# Patient Record
Sex: Female | Born: 1955 | Race: White | Hispanic: No | Marital: Married | Smoking: Former smoker
Health system: Southern US, Community
[De-identification: ages and names within clinical notes are randomized; demographics above are authoritative.]

## PROBLEM LIST (undated history)

## (undated) DIAGNOSIS — E039 Hypothyroidism, unspecified: Secondary | ICD-10-CM

## (undated) DIAGNOSIS — I219 Acute myocardial infarction, unspecified: Secondary | ICD-10-CM

## (undated) DIAGNOSIS — I639 Cerebral infarction, unspecified: Secondary | ICD-10-CM

## (undated) DIAGNOSIS — I2510 Atherosclerotic heart disease of native coronary artery without angina pectoris: Secondary | ICD-10-CM

## (undated) DIAGNOSIS — F419 Anxiety disorder, unspecified: Secondary | ICD-10-CM

## (undated) DIAGNOSIS — J40 Bronchitis, not specified as acute or chronic: Secondary | ICD-10-CM

## (undated) HISTORY — PX: TUBAL LIGATION: SHX77

## (undated) HISTORY — DX: Anxiety disorder, unspecified: F41.9

## (undated) HISTORY — DX: Cerebral infarction, unspecified: I63.9

## (undated) HISTORY — DX: Acute myocardial infarction, unspecified: I21.9

## (undated) HISTORY — DX: Bronchitis, not specified as acute or chronic: J40

---

## 1998-12-15 ENCOUNTER — Encounter: Admission: RE | Admit: 1998-12-15 | Discharge: 1998-12-15 | Payer: Self-pay | Admitting: Family Medicine

## 1998-12-15 ENCOUNTER — Encounter: Payer: Self-pay | Admitting: Family Medicine

## 2001-11-27 ENCOUNTER — Encounter: Payer: Self-pay | Admitting: Family Medicine

## 2001-11-27 ENCOUNTER — Ambulatory Visit (HOSPITAL_COMMUNITY): Admission: RE | Admit: 2001-11-27 | Discharge: 2001-11-27 | Payer: Self-pay | Admitting: Family Medicine

## 2003-03-21 ENCOUNTER — Ambulatory Visit (HOSPITAL_COMMUNITY): Admission: RE | Admit: 2003-03-21 | Discharge: 2003-03-21 | Payer: Self-pay | Admitting: Family Medicine

## 2007-06-15 ENCOUNTER — Ambulatory Visit (HOSPITAL_COMMUNITY): Admission: RE | Admit: 2007-06-15 | Discharge: 2007-06-15 | Payer: Self-pay | Admitting: Family Medicine

## 2007-11-04 DIAGNOSIS — D239 Other benign neoplasm of skin, unspecified: Secondary | ICD-10-CM

## 2007-11-04 HISTORY — DX: Other benign neoplasm of skin, unspecified: D23.9

## 2008-01-28 DIAGNOSIS — I219 Acute myocardial infarction, unspecified: Secondary | ICD-10-CM

## 2008-01-28 HISTORY — PX: ANGIOPLASTY: SHX39

## 2008-01-28 HISTORY — DX: Acute myocardial infarction, unspecified: I21.9

## 2008-04-27 ENCOUNTER — Emergency Department: Payer: Self-pay

## 2008-06-21 ENCOUNTER — Encounter: Payer: Self-pay | Admitting: Internal Medicine

## 2008-06-27 ENCOUNTER — Encounter: Payer: Self-pay | Admitting: Internal Medicine

## 2008-07-27 ENCOUNTER — Encounter: Payer: Self-pay | Admitting: Internal Medicine

## 2008-11-25 ENCOUNTER — Observation Stay: Payer: Self-pay | Admitting: Specialist

## 2008-11-28 ENCOUNTER — Encounter: Payer: Self-pay | Admitting: Unknown Physician Specialty

## 2008-12-14 ENCOUNTER — Ambulatory Visit: Payer: Self-pay | Admitting: Unknown Physician Specialty

## 2008-12-27 ENCOUNTER — Encounter: Payer: Self-pay | Admitting: Unknown Physician Specialty

## 2009-12-04 ENCOUNTER — Ambulatory Visit: Payer: Self-pay | Admitting: Surgery

## 2009-12-04 ENCOUNTER — Ambulatory Visit: Admission: RE | Admit: 2009-12-04 | Discharge: 2009-12-04 | Payer: Self-pay | Admitting: Family Medicine

## 2011-11-05 ENCOUNTER — Ambulatory Visit: Payer: Self-pay | Admitting: Family Medicine

## 2011-11-20 ENCOUNTER — Ambulatory Visit: Payer: Self-pay | Admitting: Emergency Medicine

## 2011-12-02 ENCOUNTER — Ambulatory Visit: Payer: Self-pay | Admitting: Emergency Medicine

## 2011-12-04 LAB — PATHOLOGY REPORT

## 2012-04-20 ENCOUNTER — Emergency Department: Payer: Self-pay | Admitting: Emergency Medicine

## 2012-06-12 ENCOUNTER — Observation Stay (HOSPITAL_COMMUNITY)
Admission: EM | Admit: 2012-06-12 | Discharge: 2012-06-13 | Disposition: A | Discharge status: Home / Self Care | Payer: Managed Care, Other (non HMO) | Attending: Cardiovascular Disease | Admitting: Cardiovascular Disease

## 2012-06-12 ENCOUNTER — Encounter (HOSPITAL_COMMUNITY): Payer: Self-pay | Admitting: Emergency Medicine

## 2012-06-12 ENCOUNTER — Emergency Department (HOSPITAL_COMMUNITY): Payer: Managed Care, Other (non HMO)

## 2012-06-12 DIAGNOSIS — R42 Dizziness and giddiness: Secondary | ICD-10-CM

## 2012-06-12 DIAGNOSIS — I959 Hypotension, unspecified: Principal | ICD-10-CM | POA: Insufficient documentation

## 2012-06-12 DIAGNOSIS — I2510 Atherosclerotic heart disease of native coronary artery without angina pectoris: Secondary | ICD-10-CM

## 2012-06-12 DIAGNOSIS — I252 Old myocardial infarction: Secondary | ICD-10-CM | POA: Insufficient documentation

## 2012-06-12 DIAGNOSIS — E039 Hypothyroidism, unspecified: Secondary | ICD-10-CM | POA: Insufficient documentation

## 2012-06-12 DIAGNOSIS — Z8674 Personal history of sudden cardiac arrest: Secondary | ICD-10-CM | POA: Insufficient documentation

## 2012-06-12 DIAGNOSIS — Z9861 Coronary angioplasty status: Secondary | ICD-10-CM | POA: Insufficient documentation

## 2012-06-12 DIAGNOSIS — R0602 Shortness of breath: Secondary | ICD-10-CM | POA: Insufficient documentation

## 2012-06-12 DIAGNOSIS — R079 Chest pain, unspecified: Secondary | ICD-10-CM | POA: Insufficient documentation

## 2012-06-12 HISTORY — DX: Atherosclerotic heart disease of native coronary artery without angina pectoris: I25.10

## 2012-06-12 HISTORY — DX: Hypothyroidism, unspecified: E03.9

## 2012-06-12 LAB — CBC WITH DIFFERENTIAL/PLATELET
Eosinophils Absolute: 0 10*3/uL (ref 0.0–0.7)
Eosinophils Relative: 1 % (ref 0–5)
HCT: 39.2 % (ref 36.0–46.0)
Lymphocytes Relative: 31 % (ref 12–46)
Lymphs Abs: 1.3 10*3/uL (ref 0.7–4.0)
MCH: 28.7 pg (ref 26.0–34.0)
MCV: 82 fL (ref 78.0–100.0)
Monocytes Absolute: 0.3 10*3/uL (ref 0.1–1.0)
Monocytes Relative: 6 % (ref 3–12)
Platelets: 220 10*3/uL (ref 150–400)
RBC: 4.78 MIL/uL (ref 3.87–5.11)
WBC: 4.3 10*3/uL (ref 4.0–10.5)

## 2012-06-12 LAB — COMPREHENSIVE METABOLIC PANEL
ALT: 19 U/L (ref 0–35)
BUN: 15 mg/dL (ref 6–23)
CO2: 21 mEq/L (ref 19–32)
Calcium: 9.3 mg/dL (ref 8.4–10.5)
Creatinine, Ser: 0.86 mg/dL (ref 0.50–1.10)
GFR calc Af Amer: 87 mL/min — ABNORMAL LOW (ref >90)
GFR calc non Af Amer: 75 mL/min — ABNORMAL LOW (ref >90)
Glucose, Bld: 98 mg/dL (ref 70–99)
Sodium: 138 mEq/L (ref 135–145)
Total Protein: 7.5 g/dL (ref 6.0–8.3)

## 2012-06-12 LAB — CBC
HCT: 38.5 % (ref 36.0–46.0)
MCV: 83.5 fL (ref 78.0–100.0)
RBC: 4.61 MIL/uL (ref 3.87–5.11)
RDW: 13.3 % (ref 11.5–15.5)
WBC: 5.1 10*3/uL (ref 4.0–10.5)

## 2012-06-12 LAB — PRO B NATRIURETIC PEPTIDE: Pro B Natriuretic peptide (BNP): 338.3 pg/mL — ABNORMAL HIGH (ref 0–125)

## 2012-06-12 LAB — CREATININE, SERUM
GFR calc Af Amer: >90 mL/min (ref >90)
GFR calc non Af Amer: 78 mL/min — ABNORMAL LOW (ref >90)

## 2012-06-12 LAB — TROPONIN I
Troponin I: <0.3 ng/mL (ref <0.30)
Troponin I: <0.3 ng/mL (ref <0.30)

## 2012-06-12 MED ORDER — ATORVASTATIN CALCIUM 80 MG PO TABS
80.0000 mg | ORAL_TABLET | Freq: Every day | ORAL | Status: DC
  Administered 2012-06-12: 80 mg via ORAL
  Filled 2012-06-12 (×2): qty 1

## 2012-06-12 MED ORDER — CITALOPRAM HYDROBROMIDE 10 MG PO TABS
10.0000 mg | ORAL_TABLET | Freq: Every day | ORAL | Status: DC
  Filled 2012-06-12 (×2): qty 1

## 2012-06-12 MED ORDER — ASPIRIN 300 MG RE SUPP
300.0000 mg | RECTAL | Status: DC
  Filled 2012-06-12: qty 1

## 2012-06-12 MED ORDER — FLUTICASONE PROPIONATE 50 MCG/ACT NA SUSP
2.0000 | Freq: Every day | NASAL | Status: DC
  Filled 2012-06-12: qty 16

## 2012-06-12 MED ORDER — NITROGLYCERIN 0.4 MG SL SUBL: 0.4000 mg | SUBLINGUAL_TABLET | SUBLINGUAL | Status: DC | PRN

## 2012-06-12 MED ORDER — ASPIRIN EC 81 MG PO TBEC
81.0000 mg | DELAYED_RELEASE_TABLET | Freq: Every day | ORAL | Status: DC
  Filled 2012-06-12: qty 1

## 2012-06-12 MED ORDER — ENOXAPARIN SODIUM 40 MG/0.4ML ~~LOC~~ SOLN
40.0000 mg | SUBCUTANEOUS | Status: DC
  Administered 2012-06-12: 40 mg via SUBCUTANEOUS
  Filled 2012-06-12 (×2): qty 0.4

## 2012-06-12 MED ORDER — METOPROLOL SUCCINATE ER 25 MG PO TB24
25.0000 mg | ORAL_TABLET | Freq: Every day | ORAL | Status: DC
  Administered 2012-06-12: 25 mg via ORAL
  Filled 2012-06-12 (×2): qty 1

## 2012-06-12 MED ORDER — ASPIRIN EC 81 MG PO TBEC
81.0000 mg | DELAYED_RELEASE_TABLET | Freq: Every day | ORAL | Status: DC
Start: 2012-06-13 — End: 2012-06-13
  Filled 2012-06-12: qty 1

## 2012-06-12 MED ORDER — ASPIRIN 81 MG PO CHEW: 324.0000 mg | CHEWABLE_TABLET | ORAL | Status: DC

## 2012-06-12 MED ORDER — SODIUM CHLORIDE 0.9 % IV SOLN
INTRAVENOUS | Status: AC
  Administered 2012-06-12: 20:00:00 via INTRAVENOUS

## 2012-06-12 MED ORDER — LEVOTHYROXINE SODIUM 25 MCG PO TABS
25.0000 ug | ORAL_TABLET | Freq: Every day | ORAL | Status: DC
  Administered 2012-06-13: 25 ug via ORAL
  Filled 2012-06-12 (×2): qty 1

## 2012-06-12 MED ORDER — RAMIPRIL 5 MG PO CAPS
5.0000 mg | ORAL_CAPSULE | Freq: Every day | ORAL | Status: DC
  Administered 2012-06-12: 5 mg via ORAL
  Filled 2012-06-12 (×2): qty 1

## 2012-06-12 MED ORDER — ACETAMINOPHEN 325 MG PO TABS: 650.0000 mg | ORAL_TABLET | ORAL | Status: DC | PRN

## 2012-06-12 MED ORDER — PANTOPRAZOLE SODIUM 40 MG PO TBEC
40.0000 mg | DELAYED_RELEASE_TABLET | Freq: Every day | ORAL | Status: DC
  Administered 2012-06-12: 40 mg via ORAL
  Filled 2012-06-12: qty 1

## 2012-06-12 MED ORDER — ONDANSETRON HCL 4 MG/2ML IJ SOLN: 4.0000 mg | Freq: Four times a day (QID) | INTRAMUSCULAR | Status: DC | PRN

## 2012-06-12 NOTE — ED Provider Notes (Signed)
History     CSN: [id number]  Arrival date & time 06/12/12  1046   First MD Initiated Contact with Patient 06/12/12 1049      No chief complaint on file.   (Consider location/radiation/quality/duration/timing/severity/associated sxs/prior treatment) HPI Comments: Patient brought to the ER by ambulance for chest pain. Patient reports that she started having some slight chest discomfort yesterday but it would come and go. This morning the pain has changed. Initially was under the left breast, now it is left upper chest. She had slight shortness of breath and nausea associated with the symptoms. She took aspirin and nitroglycerin. She's not sure if the nitroglycerin helped at all. Patient reports that she was feeling dizzy, like she was going to pass out.  Patient reports a previous "massive heart attack" 4 years ago. Patient reports that she had a left main occlusion and had a cardiac arrest with it. She reports that her only warning symptoms were dizziness and feeling like she was going to pass out, similar to today.  Patient was life flighted to [place]. She reports that she had a stent placed, but had occlusion of the stent several months later and required repeat stenting. Since then, however, she has been doing well. She reports that her stress test ovaries show false positives, she had a cardiac catheterization one year ago that showed everything was open.   No past medical history on file.  No past surgical history on file.  No family history on file.  History  Substance Use Topics  . Smoking status: Not on file  . Smokeless tobacco: Not on file  . Alcohol Use: Not on file    OB History   No data available      Review of Systems  Respiratory: Positive for shortness of breath.   Cardiovascular: Positive for chest pain.  Gastrointestinal: Positive for nausea.  Neurological: Positive for weakness.  All other systems reviewed and are negative.    Allergies  Review of  patient's allergies indicates not on file.  Home Medications  No current outpatient prescriptions on file.  There were no vitals taken for this visit.  Physical Exam  Constitutional: She is oriented to person, place, and time. She appears well-developed and well-nourished. No distress.  HENT:  Head: Normocephalic and atraumatic.  Right Ear: Hearing normal.  Left Ear: Hearing normal.  Nose: Nose normal.  Mouth/Throat: Oropharynx is clear and moist and mucous membranes are normal.  Eyes: Conjunctivae and EOM are normal. Pupils are equal, round, and reactive to light.  Neck: Normal range of motion. Neck supple.  Cardiovascular: Regular rhythm, S1 normal and S2 normal.  Exam reveals no gallop and no friction rub.   No murmur heard. Pulmonary/Chest: Effort normal and breath sounds normal. No respiratory distress. She exhibits no tenderness.  Abdominal: Soft. Normal appearance and bowel sounds are normal. There is no hepatosplenomegaly. There is no tenderness. There is no rebound, no guarding, no tenderness at McBurney's point and negative Murphy's sign. No hernia.  Musculoskeletal: Normal range of motion.  Neurological: She is alert and oriented to person, place, and time. She has normal strength. No cranial nerve deficit or sensory deficit. Coordination normal. GCS eye subscore is 4. GCS verbal subscore is 5. GCS motor subscore is 6.  Skin: Skin is warm, dry and intact. No rash noted. No cyanosis.  Psychiatric: She has a normal mood and affect. Her speech is normal and behavior is normal. Thought content normal.    ED Course  Procedures (  including critical care time)  EKG:  Date: 06/12/2012  Rate: 65  Rhythm: normal sinus rhythm  QRS Axis: normal  Intervals: normal  ST/T Wave abnormalities: nonspecific T wave changes  Conduction Disutrbances:none  Narrative Interpretation:   Old EKG Reviewed: none available    Labs Reviewed  COMPREHENSIVE METABOLIC PANEL - Abnormal; Notable  for the following:    GFR calc non Af Amer 75 (*)    GFR calc Af Amer 87 (*)    All other components within normal limits  PRO B NATRIURETIC PEPTIDE - Abnormal; Notable for the following:    Pro B Natriuretic peptide (BNP) 338.3 (*)    All other components within normal limits  CBC WITH DIFFERENTIAL  TROPONIN I   No results found.   Diagnosis: Chest pain    MDM  Patient comes to the ER for evaluation of chest pain. Patient has a significant cardiac history. Symptoms today are somewhat atypical, having intermittent pain that is not brought on by exertion and spontaneously resolves. She does, however, report that her symptoms were atypical prior to her MI as well. Her EKG did not show any acute changes and troponin was negative. Patient discussed with [facility] cardiology, will evaluate the patient for observation.        Gilda Crease, MD 06/12/12 [phone]

## 2012-06-12 NOTE — ED Notes (Signed)
Pt from home c/o chest pain with no radiation, Pt states pain yesterday, woke up a 4am w/reoccuring sharp stabbing pain in the chest. Pt denies pain at this time , no signs of distress

## 2012-06-12 NOTE — Consult Note (Signed)
ADMISSION HISTORY AND PHYSICAL   Date: 06/12/2012               Patient Name:  Tracey [name] MRN: [id number]  DOB: September 13, 1955 Age / Sex: 57 y.o., female        PCP: No primary provider on file. Primary Cardiologist: Irena Reichmann MD [facility]         History of Present Illness: Patient is a 57 y.o. female with a PMHx of CAD,  LAD occlusion associated with cardiac arrest, who was admitted to [facility] on 06/12/2012 for evaluation of chest pain.   [name] has been having some mild pain below her left breast for the past several days.  She had some lightheadedness and the pain moved up the the mid sternum.  She has not been driniking much.  She felt faint this am.  This feeling is very similar to her symptoms that she had with her prior MI and cardiac arrest.    Past cardiac hx :   April 2010 - she had a prox LAD stenosis, cardiac arrest with stenting.  Was taken  to [facility] emergent cath,  arctic sun protocol.  In November, she started havinng more dizziness and lightheadedness.  Cath revealed restenosis of her stent and she had another stent placed.    Jan. 2013 - abnormal stress test but subsequent cath looked OK.  She has had 2 stress tests which showed an abnormality and the subsequent cath did not reveal any significant abnormality.      Medications: Outpatient medications: No current facility-administered medications for this encounter.   Current Outpatient Prescriptions  Medication Sig Dispense Refill  . aspirin EC 81 MG tablet Take 81 mg by mouth daily.      . citalopram (CELEXA) 10 MG tablet Take 10 mg by mouth daily.      . fluticasone (FLONASE) 50 MCG/ACT nasal spray Place 2 sprays into the nose daily.      Marland Kitchen levothyroxine (SYNTHROID, LEVOTHROID) 25 MCG tablet Take 25 mcg by mouth daily before breakfast.      . metoprolol succinate (TOPROL-XL) 25 MG 24 hr tablet Take 25 mg by mouth daily.      . nitroGLYCERIN (NITROSTAT) 0.4 MG SL tablet Place 0.4 mg under the tongue every 5  (five) minutes as needed for chest pain.      . pantoprazole (PROTONIX) 40 MG tablet Take 40 mg by mouth daily.      . ramipril (ALTACE) 5 MG capsule Take 5 mg by mouth daily.      . rosuvastatin (CRESTOR) 40 MG tablet Take 40 mg by mouth daily.         Allergies  Allergen Reactions  . Sulfa Antibiotics Hives     Past Medical History  Diagnosis Date  . MI (myocardial infarction)   . Hypothyroid     Past Surgical History  Procedure Laterality Date  . Hernia repair      History reviewed. No pertinent family history.  Social History:  has no tobacco, alcohol, and drug history on file.   Review of Systems: Constitutional:  denies fever, chills, diaphoresis, appetite change and fatigue.  HEENT: denies photophobia, eye pain, redness, hearing loss, ear pain, congestion, sore throat, rhinorrhea, sneezing, neck pain, neck stiffness and tinnitus.  Respiratory: denies SOB, DOE, cough, chest tightness, and wheezing.  Cardiovascular: admits to chest pain, comes and goes  Gastrointestinal: admits to nausea this am,, no vomitting  Genitourinary: denies dysuria, urgency, frequency, hematuria, flank pain and  difficulty urinating.  Musculoskeletal: denies  myalgias, back pain, joint swelling, arthralgias and gait problem.   Skin: denies pallor, rash and wound.  Neurological: admits to dizziness, lightneadedmess  Hematological: denies adenopathy, easy bruising, personal or family bleeding history.  Psychiatric/ Behavioral: denies suicidal ideation, mood changes, confusion, nervousness, sleep disturbance and agitation.    Physical Exam: BP 112/62  Pulse 64  Temp(Src) 97.5 F (36.4 C) (Oral)  Resp 11  SpO2 98%  General: Vital signs reviewed and noted. Well-developed, well-nourished, in no acute distress; alert, appropriate and cooperative throughout examination.  Head: Normocephalic, atraumatic, sclera anicteric, mucus membranes are moist  Neck: Supple. Negative for carotid bruits.  JVD not elevated.  Lungs:  Clear bilaterally to auscultation without wheezes, rales, or rhonchi. Breathing is unlabored.  Heart: RRR with S1 S2. No murmurs, rubs, or gallops appreciated.  Abdomen:  Soft, non-tender, non-distended with normoactive bowel sounds. No hepatomegaly. No rebound/guarding. No obvious abdominal masses  MSK: Strength and the appear normal for age.  Extremities: No clubbing or cyanosis. No edema.  Distal pedal pulses are 2+, decreased skin turgur.Marland Kitchen  Neurologic: Alert and oriented X 3. Moves all extremities spontaneously  Psych:  Responds to questions appropriately with a normal affect.    Lab results: Basic Metabolic Panel:  Recent Labs Lab 06/12/12 1106  NA 138  K 4.2  CL 103  CO2 21  GLUCOSE 98  BUN 15  CREATININE 0.86  CALCIUM 9.3    Liver Function Tests:  Recent Labs Lab 06/12/12 1106  AST 24  ALT 19  ALKPHOS 69  BILITOT 0.3  PROT 7.5  ALBUMIN 3.7   No results found for this basename: LIPASE, AMYLASE,  in the last 168 hours  CBC:  Recent Labs Lab 06/12/12 1106  WBC 4.3  NEUTROABS 2.6  HGB 13.7  HCT 39.2  MCV 82.0  PLT 220    Cardiac Enzymes:  Recent Labs Lab 06/12/12 1107  TROPONINI <0.30    BNP: No components found with this basename: POCBNP,   CBG: No results found for this basename: GLUCAP,  in the last 168 hours  Coagulation Studies: No results found for this basename: LABPROT, INR,  in the last 72 hours   Other results:  EKG : poor quality tracing. NSR , no ST or T wave changes.     Assessment & Plan:  1. CAD :  Her presentation is very atypical but it is very similar to her previous episodes of angina equivalent. The original episode occurred in 2010 and was associated with a subsequent cardiac arrest when she had an occlusion of her LAD.  The next time she had episodes of lightheadedness she had repeat catheterization which revealed restenosis of the LAD stent requiring an additional stent  placement.  Today she presents with episodes of lightheadedness. She states it feels very similar to her previous episodes. The confounding factors that she's been on a diet and as a result has not been eating or  drinking as much as she typically does. Her blood pressure is  a little low. We discussed the fact that his lightheadedness may just be due to volume depletion.  We will keep overnight for observation.  Will hydrate her gently.    Check cardiac enzymes.    Tomorrow we will discuss whether to send her home or transfer to [facility].  She would like to continue her cardiology relationship with [facility].    All of her other medical problems are stable.    DVT PPX -  Vesta Mixer, Montez Hageman., MD, [facility] 06/12/2012, 2:39 PM

## 2012-06-13 ENCOUNTER — Encounter (HOSPITAL_COMMUNITY): Payer: Self-pay | Admitting: Physician Assistant

## 2012-06-13 DIAGNOSIS — I2510 Atherosclerotic heart disease of native coronary artery without angina pectoris: Secondary | ICD-10-CM

## 2012-06-13 DIAGNOSIS — R42 Dizziness and giddiness: Secondary | ICD-10-CM

## 2012-06-13 DIAGNOSIS — R079 Chest pain, unspecified: Secondary | ICD-10-CM

## 2012-06-13 LAB — CBC
Platelets: 208 10*3/uL (ref 150–400)
RBC: 4.52 MIL/uL (ref 3.87–5.11)
RDW: 13.4 % (ref 11.5–15.5)
WBC: 4.9 10*3/uL (ref 4.0–10.5)

## 2012-06-13 LAB — TROPONIN I
Troponin I: <0.3 ng/mL (ref <0.30)
Troponin I: <0.3 ng/mL (ref <0.30)

## 2012-06-13 LAB — BASIC METABOLIC PANEL
CO2: 26 mEq/L (ref 19–32)
Chloride: 108 mEq/L (ref 96–112)
Creatinine, Ser: 0.94 mg/dL (ref 0.50–1.10)
GFR calc Af Amer: 78 mL/min — ABNORMAL LOW (ref >90)
Potassium: 3.9 mEq/L (ref 3.5–5.1)
Sodium: 142 mEq/L (ref 135–145)

## 2012-06-13 NOTE — Discharge Summary (Signed)
Discharge Summary   Patient ID: Tracey [name] MRN: [id number], DOB/AGE: Aug 06, 1955 57 y.o. Admit date: 06/12/2012 D/C date:     06/13/2012  Primary Cardiologist: Dr. Theophilus Bones at [facility]  Primary Discharge Diagnoses:  1. Dizziness/hypotension, suspected related to decreased PO intake - resolved with IV fluids - ramipril discontinued 2. CAD without evidence for ACS this admission  - troponins neg x 4 - prior history: 04/2008: prox LAD occlusion a/w cardiac arrest treated with stenting & arctic sun, November 2010: cath showing restenosis s/p repeat stenting, Jan 2013: abnormal stress test but cath looked OK  Secondary Discharge Diagnoses:  1. Hypothyroidism  Hospital Course: [name] is a 57 y/o F with history of CAD, LAD occlusion associated with cardiac arrest, who was admitted to [facility] on 06/12/2012 for evaluation of chest pain and lightheadedness. She has been having some mild pain below her left breast for the past several days. She had some lightheadedness and the pain moved up the the mid sternum. She also reported feeling faint. She has not been drinking much. CBC, CMET were unremarkable. CXR was negative. EKG showed NSR , no ST or T wave changes. Her chest pain was felt very atypical - cardiac enzymes remained negative. With regard to her lightheadedness, she has been on a diet and not eating or drinking as much as she typically does. Dizziness was felt related to volume depletion and improved with IV fluids overnight. Today she feels much better and is eager to go home. We will discontinue ramipiril. Dr. Elease Hashimoto has seen and examined her today and feels she is stable for discharge. The patient will call Dr. Theophilus Bones to arrange followup at [facility] on Monday as she wants to continue to be followed at [facility].  Discharge Vitals: Blood pressure 95/63, pulse 57, temperature 98.3 F (36.8 C), temperature source Oral, resp. rate 16, height 5\' 5"  (1.651 m), weight 202 lb 11.2 oz (91.944 kg), SpO2  98.00%.  Labs: Lab Results  Component Value Date   WBC 4.9 06/13/2012   HGB 12.8 06/13/2012   HCT 37.5 06/13/2012   MCV 83.0 06/13/2012   PLT 208 06/13/2012    Recent Labs Lab 06/12/12 1106  06/13/12 0525  NA 138  --  142  K 4.2  --  3.9  CL 103  --  108  CO2 21  --  26  BUN 15  --  15  CREATININE 0.86  < > 0.94  CALCIUM 9.3  --  8.9  PROT 7.5  --   --   BILITOT 0.3  --   --   ALKPHOS 69  --   --   ALT 19  --   --   AST 24  --   --   GLUCOSE 98  --  90  < > = values in this interval not displayed.  Recent Labs  06/12/12 1107 06/12/12 1800 06/13/12 0004 06/13/12 0525  TROPONINI <0.30 <0.30 <0.30 <0.30    Diagnostic Studies/Procedures   Dg Chest Port 1 View 06/12/2012   *RADIOLOGY REPORT*  Clinical Data: Left-sided chest pain  PORTABLE CHEST - 1 VIEW  Comparison: Chest radiograph 03/20/1998  Findings: Cardiac leads project over the chest.  Normal heart, mediastinal, and hilar contours.  Coronary artery stent projects over the left heart.  Pulmonary vascularity is normal.  The lungs are clear.  Negative for pleural effusion.  No acute osseous abnormality identified.  IMPRESSION: Coronary artery stent noted.  No acute cardiopulmonary disease.   Original Report  Authenticated By: Britta Mccreedy, M.D.    Discharge Medications     Medication List    STOP taking these medications       ramipril 5 MG capsule  Commonly known as:  ALTACE      TAKE these medications       aspirin EC 81 MG tablet  Take 81 mg by mouth daily.     citalopram 10 MG tablet  Commonly known as:  CELEXA  Take 10 mg by mouth daily.     fluticasone 50 MCG/ACT nasal spray  Commonly known as:  FLONASE  Place 2 sprays into the nose daily.     levothyroxine 25 MCG tablet  Commonly known as:  SYNTHROID, LEVOTHROID  Take 25 mcg by mouth daily before breakfast.     metoprolol succinate 25 MG 24 hr tablet  Commonly known as:  TOPROL-XL  Take 25 mg by mouth daily.     nitroGLYCERIN 0.4 MG SL tablet   Commonly known as:  NITROSTAT  Place 0.4 mg under the tongue every 5 (five) minutes as needed for chest pain.     pantoprazole 40 MG tablet  Commonly known as:  PROTONIX  Take 40 mg by mouth daily.     rosuvastatin 40 MG tablet  Commonly known as:  CRESTOR  Take 40 mg by mouth daily.        Disposition   The patient will be discharged in stable condition to home. Discharge Orders   Future Orders Complete By Expires     Diet - low sodium heart healthy  As directed     Discharge instructions  As directed     Comments:      Please check your blood pressure at home. If it consistently below 95 on the top number or if you feel poorly again, please call your doctor.  Do not take nitroglycerin if your blood pressure is low as it may cause it to drop further.    Increase activity slowly  As directed       Follow-up Information   Follow up with Dr. Theophilus Bones. (Please call Dr. Wynelle Cleveland office tomorrow for a followup appointment)    Contact information:   [facility] [employer]        Duration of Discharge Encounter: Greater than 30 minutes including physician and PA time.  Signed, Ronie Spies PA-C 06/13/2012, 8:53 AM  Attending NOte:  Please see my note from day of discharge.   Vesta Mixer, Montez Hageman., MD, [facility] 06/20/2012, 9:44 PM Office - [phone] Pager [phone]

## 2012-06-13 NOTE — Progress Notes (Signed)
Utilization review completed.  

## 2012-06-13 NOTE — H&P (Signed)
ADMISSION HISTORY AND PHYSICAL   Date: 06/13/2012               Patient Name:  Tracey [name] MRN: [id number]  DOB: 03/22/1955 Age / Sex: 57 y.o., female        PCP: No primary provider on file. Primary Cardiologist: Irena Reichmann MD [facility]         History of Present Illness: Patient is a 57 y.o. female with a PMHx of CAD,  LAD occlusion associated with cardiac arrest, who was admitted to [facility] on 06/12/2012 for evaluation of chest pain.   Honour has been having some mild pain below her left breast for the past several days.  She had some lightheadedness and the pain moved up the the mid sternum.  She has not been driniking much.  She felt faint this am.  This feeling is very similar to her symptoms that she had with her prior MI and cardiac arrest.    Past cardiac hx :   April 2010 - she had a prox LAD stenosis, cardiac arrest with stenting.  Was taken  to [facility] emergent cath,  arctic sun protocol.  In November, she started havinng more dizziness and lightheadedness.  Cath revealed restenosis of her stent and she had another stent placed.    Jan. 2013 - abnormal stress test but subsequent cath looked OK.  She has had 2 stress tests which showed an abnormality and the subsequent cath did not reveal any significant abnormality.      Medications: Outpatient medications: Current Facility-Administered Medications  Medication Dose Route Frequency Provider Last Rate Last Dose  . acetaminophen (TYLENOL) tablet 650 mg  650 mg Oral Q4H PRN Vesta Mixer, MD      . aspirin chewable tablet 324 mg  324 mg Oral NOW Vesta Mixer, MD       Or  . aspirin suppository 300 mg  300 mg Rectal NOW Vesta Mixer, MD      . aspirin EC tablet 81 mg  81 mg Oral Daily Vesta Mixer, MD      . aspirin EC tablet 81 mg  81 mg Oral Daily Vesta Mixer, MD      . atorvastatin (LIPITOR) tablet 80 mg  80 mg Oral q1800 Vesta Mixer, MD   80 mg at 06/12/12 1931  . citalopram (CELEXA) tablet 10 mg   10 mg Oral Daily Vesta Mixer, MD      . enoxaparin (LOVENOX) injection 40 mg  40 mg Subcutaneous Q24H Vesta Mixer, MD   40 mg at 06/12/12 2104  . fluticasone (FLONASE) 50 MCG/ACT nasal spray 2 spray  2 spray Each Nare Daily Vesta Mixer, MD      . levothyroxine (SYNTHROID, LEVOTHROID) tablet 25 mcg  25 mcg Oral QAC breakfast Vesta Mixer, MD   25 mcg at 06/13/12 0805  . metoprolol succinate (TOPROL-XL) 24 hr tablet 25 mg  25 mg Oral QHS Vesta Mixer, MD   25 mg at 06/12/12 2105  . nitroGLYCERIN (NITROSTAT) SL tablet 0.4 mg  0.4 mg Sublingual Q5 Min x 3 PRN Vesta Mixer, MD      . ondansetron [facility]) injection 4 mg  4 mg Intravenous Q6H PRN Vesta Mixer, MD      . pantoprazole (PROTONIX) EC tablet 40 mg  40 mg Oral Daily Vesta Mixer, MD   40 mg at 06/12/12 1930     Allergies  Allergen  Reactions  . Sulfa Antibiotics Hives     Past Medical History  Diagnosis Date  . CAD (coronary artery disease)     a. 04/2008: cariac arrest with prox LAD occ s/p stenting, arctic sun. b. 11/2008: restenosis s/p repeat stenting. c. 01/2011: abnormal stress test but cath reportedly OK.  Marland Kitchen Hypothyroid     History reviewed. No pertinent past surgical history.  History reviewed. No pertinent family history.  Social History:  reports that she has never smoked. She has never used smokeless tobacco. She reports that she does not drink alcohol. Her drug history is not on file.   Review of Systems: Constitutional:  denies fever, chills, diaphoresis, appetite change and fatigue.  HEENT: denies photophobia, eye pain, redness, hearing loss, ear pain, congestion, sore throat, rhinorrhea, sneezing, neck pain, neck stiffness and tinnitus.  Respiratory: denies SOB, DOE, cough, chest tightness, and wheezing.  Cardiovascular: admits to chest pain, comes and goes  Gastrointestinal: admits to nausea this am,, no vomitting  Genitourinary: denies dysuria, urgency, frequency, hematuria, flank pain  and difficulty urinating.  Musculoskeletal: denies  myalgias, back pain, joint swelling, arthralgias and gait problem.   Skin: denies pallor, rash and wound.  Neurological: admits to dizziness, lightneadedmess  Hematological: denies adenopathy, easy bruising, personal or family bleeding history.  Psychiatric/ Behavioral: denies suicidal ideation, mood changes, confusion, nervousness, sleep disturbance and agitation.    Physical Exam: BP 95/63  Pulse 57  Temp(Src) 98.3 F (36.8 C) (Oral)  Resp 16  Ht 5\' 5"  (1.651 m)  Wt 202 lb 11.2 oz (91.944 kg)  BMI 33.73 kg/m2  SpO2 98%  General: Vital signs reviewed and noted. Well-developed, well-nourished, in no acute distress; alert, appropriate and cooperative throughout examination.  Head: Normocephalic, atraumatic, sclera anicteric, mucus membranes are moist  Neck: Supple. Negative for carotid bruits. JVD not elevated.  Lungs:  Clear bilaterally to auscultation without wheezes, rales, or rhonchi. Breathing is unlabored.  Heart: RRR with S1 S2. No murmurs, rubs, or gallops appreciated.  Abdomen:  Soft, non-tender, non-distended with normoactive bowel sounds. No hepatomegaly. No rebound/guarding. No obvious abdominal masses  MSK: Strength and the appear normal for age.  Extremities: No clubbing or cyanosis. No edema.  Distal pedal pulses are 2+, decreased skin turgur.Marland Kitchen  Neurologic: Alert and oriented X 3. Moves all extremities spontaneously  Psych:  Responds to questions appropriately with a normal affect.    Lab results: Basic Metabolic Panel:  Recent Labs Lab 06/12/12 1106 06/12/12 1800 06/13/12 0525  NA 138  --  142  K 4.2  --  3.9  CL 103  --  108  CO2 21  --  26  GLUCOSE 98  --  90  BUN 15  --  15  CREATININE 0.86 0.83 0.94  CALCIUM 9.3  --  8.9    Liver Function Tests:  Recent Labs Lab 06/12/12 1106  AST 24  ALT 19  ALKPHOS 69  BILITOT 0.3  PROT 7.5  ALBUMIN 3.7   No results found for this basename: LIPASE,  AMYLASE,  in the last 168 hours  CBC:  Recent Labs Lab 06/12/12 1106 06/12/12 1800 06/13/12 0525  WBC 4.3 5.1 4.9  NEUTROABS 2.6  --   --   HGB 13.7 13.6 12.8  HCT 39.2 38.5 37.5  MCV 82.0 83.5 83.0  PLT 220 253 208    Cardiac Enzymes:  Recent Labs Lab 06/12/12 1107 06/12/12 1800 06/13/12 0004 06/13/12 0525  TROPONINI <0.30 <0.30 <0.30 <0.30    BNP:  No components found with this basename: POCBNP,   CBG: No results found for this basename: GLUCAP,  in the last 168 hours  Coagulation Studies: No results found for this basename: LABPROT, INR,  in the last 72 hours   Other results:  EKG : poor quality tracing. NSR , no ST or T wave changes.     Assessment & Plan:  1. CAD :  Her presentation is very atypical but it is very similar to her previous episodes of angina equivalent. The original episode occurred in 2010 and was associated with a subsequent cardiac arrest when she had an occlusion of her LAD.  The next time she had episodes of lightheadedness she had repeat catheterization which revealed restenosis of the LAD stent requiring an additional stent placement.  Today she presents with episodes of lightheadedness. She states it feels very similar to her previous episodes. The confounding factors that she's been on a diet and as a result has not been eating or  drinking as much as she typically does. Her blood pressure is  a little low. We discussed the fact that his lightheadedness may just be due to volume depletion.  We will keep overnight for observation.  Will hydrate her gently.    Check cardiac enzymes.    Tomorrow we will discuss whether to send her home or transfer to [facility].  She would like to continue her cardiology relationship with [facility].    All of her other medical problems are stable.    DVT PPX -   Alvia Grove., MD, [facility] 06/13/2012, 8:58 AM

## 2012-06-13 NOTE — Progress Notes (Signed)
PROGRESS NOTE  Subjective:   Patient is a 57 y.o. female with a PMHx of CAD, LAD occlusion associated with cardiac arrest, who was admitted to [facility] on 06/12/2012 for evaluation of chest pain.  Makiyla has been having some mild pain below her left breast for the past several days. She had some lightheadedness and the pain moved up the the mid sternum. She has not been driniking much. She felt faint this am. This feeling is very similar to her symptoms that she had with her prior MI and cardiac arrest.  We gave her IV NS overnight and she feels much better.   Cardiac enzymes are negative. She want to go home.  Objective:    Vital Signs:   Temp:  [97.5 F (36.4 C)-98.3 F (36.8 C)] 98.3 F (36.8 C) (05/18 0500) Pulse Rate:  [56-66] 57 (05/18 0500) Resp:  [10-21] 16 (05/18 0500) BP: (75-112)/(54-75) 95/63 mmHg (05/18 0500) SpO2:  [96 %-99 %] 98 % (05/18 0500) Weight:  [202 lb (91.627 kg)-202 lb 11.2 oz (91.944 kg)] 202 lb 11.2 oz (91.944 kg) (05/18 0500)  Last BM Date: 06/11/12   24-hour weight change: Weight change:   Weight trends: Filed Weights   06/12/12 1725 06/13/12 0500  Weight: 202 lb (91.627 kg) 202 lb 11.2 oz (91.944 kg)    Intake/Output:        Physical Exam: BP 95/63  Pulse 57  Temp(Src) 98.3 F (36.8 C) (Oral)  Resp 16  Ht 5\' 5"  (1.651 m)  Wt 202 lb 11.2 oz (91.944 kg)  BMI 33.73 kg/m2  SpO2 98%  General: Vital signs reviewed and noted.   Head: Normocephalic, atraumatic.  Eyes: conjunctivae/corneas clear.  EOM's intact.   Throat: normal  Neck:  normal  Lungs:    clear  Heart:  RR, normal S1, S2.   Abdomen:  Soft, non-tender, non-distended    Extremities:  ne edema  Neurologic: A&O X3, CN II - XII are grossly intact.   Psych: Normal     Labs: BMET:  Recent Labs  06/12/12 1106 06/12/12 1800 06/13/12 0525  NA 138  --  142  K 4.2  --  3.9  CL 103  --  108  CO2 21  --  26  GLUCOSE 98  --  90  BUN 15  --  15  CREATININE 0.86 0.83  0.94  CALCIUM 9.3  --  8.9    Liver function tests:  Recent Labs  06/12/12 1106  AST 24  ALT 19  ALKPHOS 69  BILITOT 0.3  PROT 7.5  ALBUMIN 3.7   No results found for this basename: LIPASE, AMYLASE,  in the last 72 hours  CBC:  Recent Labs  06/12/12 1106 06/12/12 1800 06/13/12 0525  WBC 4.3 5.1 4.9  NEUTROABS 2.6  --   --   HGB 13.7 13.6 12.8  HCT 39.2 38.5 37.5  MCV 82.0 83.5 83.0  PLT 220 253 208    Cardiac Enzymes:  Recent Labs  06/12/12 1107 06/12/12 1800 06/13/12 0004 06/13/12 0525  TROPONINI <0.30 <0.30 <0.30 <0.30    Coagulation Studies: No results found for this basename: LABPROT, INR,  in the last 72 hours  Other: No components found with this basename: POCBNP,  No results found for this basename: DDIMER,  in the last 72 hours No results found for this basename: HGBA1C,  in the last 72 hours No results found for this basename: CHOL, HDL, LDLCALC, TRIG, CHOLHDL,  in the last 72  hours No results found for this basename: TSH, T4TOTAL, FREET3, T3FREE, THYROIDAB,  in the last 72 hours No results found for this basename: VITAMINB12, FOLATE, FERRITIN, TIBC, IRON, RETICCTPCT,  in the last 72 hours   Other results:  Tele:  NSR  Medications:    Infusions:    Scheduled Medications: . aspirin  324 mg Oral NOW   Or  . aspirin  300 mg Rectal NOW  . aspirin EC  81 mg Oral Daily  . aspirin EC  81 mg Oral Daily  . atorvastatin  80 mg Oral q1800  . citalopram  10 mg Oral Daily  . enoxaparin (LOVENOX) injection  40 mg Subcutaneous Q24H  . fluticasone  2 spray Each Nare Daily  . levothyroxine  25 mcg Oral QAC breakfast  . metoprolol succinate  25 mg Oral QHS  . pantoprazole  40 mg Oral Daily  . ramipril  5 mg Oral Daily    Assessment/ Plan:    1. Dizziness:  Resolved with IVF.  No indication of ACS, tele is unremarkable. Enzymes are negative. Will Dc to home on her regular home meds. She will call Dr. Theophilus Bones Monday and arrange follow up at [facility]  ( she wants to continue to be followed at [facility]).  I suspect her dizziness was due to dehydration - she admits to not eating or drinking well recently.    2. CAD:  No evidence of ACS.  Follow up at [facility].      Disposition: DC to home.  Length of Stay: 1  Vesta Mixer, Montez Hageman., MD, [facility] 06/13/2012, 7:38 AM Office [phone] Pager [phone]

## 2015-03-23 ENCOUNTER — Ambulatory Visit: Payer: Managed Care, Other (non HMO) | Attending: Obstetrics and Gynecology | Admitting: Physical Therapy

## 2015-03-23 ENCOUNTER — Encounter: Payer: Self-pay | Admitting: Physical Therapy

## 2015-03-23 VITALS — BP 101/66

## 2015-03-23 DIAGNOSIS — R269 Unspecified abnormalities of gait and mobility: Secondary | ICD-10-CM

## 2015-03-23 DIAGNOSIS — N8184 Pelvic muscle wasting: Secondary | ICD-10-CM | POA: Insufficient documentation

## 2015-03-23 DIAGNOSIS — R531 Weakness: Secondary | ICD-10-CM | POA: Diagnosis present

## 2015-03-23 DIAGNOSIS — R279 Unspecified lack of coordination: Secondary | ICD-10-CM | POA: Insufficient documentation

## 2015-03-23 DIAGNOSIS — M6289 Other specified disorders of muscle: Secondary | ICD-10-CM

## 2015-03-23 NOTE — Therapy (Signed)
[place] MAIN [facility] SERVICES [address] [place], [place], [address] Phone: [phone]   Fax:  [phone]  Physical Therapy Evaluation  Patient Details  Name: Tracey [name] MRN: [id number] Date of Birth: 1955/07/21 Referring Provider: Dr. Georgianne Fick  Encounter Date: 03/23/2015      PT End of Session - 03/23/15 1251    Visit Number 1   Number of Visits 12   Date for PT Re-Evaluation 06/08/15   PT Start Time 1105   PT Stop Time 1215   PT Time Calculation (min) 70 min   Activity Tolerance Patient tolerated treatment well;No increased pain   Behavior During Therapy [facility] for tasks assessed/performed      Past Medical History  Diagnosis Date  . CAD (coronary artery disease)     a. 04/2008: cariac arrest with prox LAD occ s/p stenting, arctic sun. b. 11/2008: restenosis s/p repeat stenting. c. 01/2011: abnormal stress test but cath reportedly OK.  Marland Kitchen Hypothyroid   . Anxiety   . Myocardial infarction [facility]) 2010    stents and cardiac rehab   . Bronchitis     Past Surgical History  Procedure Laterality Date  . Angioplasty  2010    2 stents (femoral)   . Tubal ligation      Filed Vitals:   03/23/15 1158  BP: 101/66    Visit Diagnosis:  Pelvic floor dysfunction - Plan: PT plan of care cert/re-cert  Lack of coordination - Plan: PT plan of care cert/re-cert  Gait abnormality - Plan: PT plan of care cert/re-cert  Weakness - Plan: PT plan of care cert/re-cert      Subjective Assessment - 03/23/15 1117    Subjective 1) urinary   Sx: Pt reported she noticed urinary incontinence for the past 10 years with worsening of Sx in the past 2 years. Incomplete emptying with urination where she feels leakage of a few drops after voiding with body movements (twisting). Leakage also occurs with coughing and sneezing.  Frequency occurs 1x/ every 2 hrs.  Daily fluid intake: "I don't drink enough water, I  don't drink any water."  1/4 cup of  coffee, 1- (16 fl oz)  caffeinated tea,   1- (16 fl oz) decaffinated.   2) constipation: bowel movements occur daily but Stool Type 1 . 3) abdominal pain: upper quadrant bilateral : Dx of IBS with gas and abdominal pain daily after meals. Pain level 5/10.          Pertinent History Hx of tubal ligation, G4004 all vaginal deliveries with stitches and long hours of labor with 1st delivery. Hx of LBP and tailbone  but it does not hurt anymore since taking aspirin after the heart attack. Occupation: sitting 8 hrs with breaks, Physical routine: gym 2x/ week, walking on treadmill 30 min, weight lifting: legs 50-55#, UE 20-25#. Denied crunches/ sit-ups.    Patient Stated Goals 1) stop leakage 2) ease constipation 3) ase abdominal pain             [facility] PT Assessment - 03/23/15 1116    Assessment   Medical Diagnosis SUI   Referring Provider Dr. Georgianne Fick   Precautions   Precautions None   Restrictions   Weight Bearing Restrictions No   Balance Screen   Has the patient fallen in the past 6 months Yes   How many times? 2  due to dizzines spells from inner ear.    Has the patient had a decrease in activity level because  of a fear of falling?  --  Pt's vertigo has been resolved w/ vestibular therapt   Observation/Other Assessments   Other Surveys  --  PDFI 54%, [place] 16%    Coordination   Gross Motor Movements are Fluid and Coordinated --  diaphragmatic breathing, limited posterior expansion   Fine Motor Movements are Fluid and Coordinated --  abdominal/ pelvic floor straining w/ bowel movement   Posture/Postural Control   Posture Comments lumbopelvic instability w/ hip flexion   Strength   Overall Strength Comments hip abd sidelying bil 3/5, hip flexion 4-/5 bilaterally   Palpation   Spinal mobility increased midback tensions    Bed Mobility   Bed Mobility --  crunch method   Ambulation/Gait   Gait velocity 0.9 m/s   Gait Comments R trunk lean on R SLS                     [facility] Adult PT Treatment/Exercise - 03/23/15 1116    Self-Care   Self-Care --  education   Neuro Re-ed    Neuro Re-ed Details  ways to decrease straining on pelvic floor, breathing with pelvic floor relaxation/lengthening                PT Education - 03/23/15 1248    Education provided Yes   Education Details POC, anatomy, physiology, goals   Person(s) Educated Patient   Methods Explanation;Demonstration;Tactile cues;Verbal cues;Handout   Comprehension Verbalized understanding;Returned demonstration             PT Long Term Goals - 03/23/15 1143    PT LONG TERM GOAL #1   Title Pt will be compliant with increased water intake from zero amount to 1 glass/ per day for 1 week while decreasing bladder irritant intake from 1/2 cup of coffee and 32 fl oz of tea to 0 cup of coffee and keeping 32 fl oz of tea in order to promote bladder health.    Time 12   Period Weeks   Status New   PT LONG TERM GOAL #2   Title Pt will report decreased  abdominal pain 5/10 to < 3/10 pain after eating in order to improve QOL.    Time 12   Period Weeks   Status New   PT LONG TERM GOAL #3   Title Pt will report [place] Stool Type 3-4 for better bowels and minized constipation.    Time 12   Period Weeks   Status New   PT LONG TERM GOAL #4   Title Pt will decrease her score on [place] from 16% to < 10% in order to increase participation in ADLs and community.    Time 12   Period Weeks   Status New   PT LONG TERM GOAL #5   Title Pt will decrease her score on PFDI from 54% to < 44% in order to improve pelvic floor function   Time 12   Period Weeks   Status New               Plan - 03/23/15 1252    Clinical Impression Statement  Pt is a 60 yo female whose complaints  include urinary incontinence, constipation, and abdominal pain . These deficits impact her QOL. Her clinical presentations include poor  coordination of pelvic floor mm with deep core system, hip weakness, gait  deviations, increased midback mm tensions, poor toileting and bladder  health education.  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 Further  pelvic floor assessment will be performed at next session.  Pt 's condition is moderate in complexity and is evolving in nature.      Pt will benefit from skilled therapeutic intervention in order to improve on the following deficits Abnormal gait;Decreased strength;Increased muscle spasms;Improper body mechanics;Postural dysfunction;Difficulty walking;Impaired flexibility;Decreased mobility;Decreased activity tolerance;Decreased balance;Decreased range of motion;Impaired sensation;Decreased coordination;Pain;Decreased safety awareness;Decreased endurance   Rehab Potential Good   PT Frequency 1x / week   PT Duration 12 weeks   PT Treatment/Interventions ADLs/Self Care Home Management;Aquatic Therapy;Cryotherapy;Electrical Stimulation;Moist Heat;Traction;Patient/family education;Neuromuscular re-education;Balance training;Therapeutic exercise;Therapeutic activities;Functional mobility training;Stair training;Gait training;Manual techniques;Manual lymph drainage;Scar mobilization;Passive range of motion;Energy conservation;Taping   Consulted and Agree with Plan of Care Patient         Problem List Patient Active Problem List   Diagnosis Date Noted  . Dizziness 06/13/2012  . CAD (coronary artery disease) 06/13/2012    Jerl Mina ,PT, DPT, E-RYT  03/23/2015, 1:07 PM  [place] MAIN [facility] SERVICES [address] [place], [place], [address] Phone: [phone]   Fax:  [phone]  Name: Tracey [name] MRN: [id number] Date of Birth: 10-23-1955

## 2015-03-23 NOTE — Patient Instructions (Addendum)
     Increase water from zero to 1 (16 fl oz) / day and decrease 1/4 cup of coffee while keeping 2 (16 fl oz of tea) for one week.  The following week, increase water to 2 ( 16 fl oz ) /day and 2 (16 fl oz of tea)  To make ratio 1:1 water to bladder irritant and then gradually increase 2:1 water to bladder irritant ratio     Handout on log rolling

## 2015-04-04 ENCOUNTER — Ambulatory Visit: Payer: Managed Care, Other (non HMO) | Attending: Obstetrics and Gynecology | Admitting: Physical Therapy

## 2015-04-04 DIAGNOSIS — N8184 Pelvic muscle wasting: Secondary | ICD-10-CM | POA: Diagnosis not present

## 2015-04-04 DIAGNOSIS — R531 Weakness: Secondary | ICD-10-CM

## 2015-04-04 DIAGNOSIS — R269 Unspecified abnormalities of gait and mobility: Secondary | ICD-10-CM

## 2015-04-04 DIAGNOSIS — M6289 Other specified disorders of muscle: Secondary | ICD-10-CM

## 2015-04-04 DIAGNOSIS — R279 Unspecified lack of coordination: Secondary | ICD-10-CM

## 2015-04-04 NOTE — Patient Instructions (Signed)
    You are now ready to begin training the deep core muscles system: diaphragm, transverse abdominis, pelvic floor . These muscles must work together as a team.           The key to these exercises to train the brain to coordinate the timing of these muscles and to have them turn on for long periods of time to hold you upright against gravity (especially important if you are on your feet all day).These muscles are postural muscles and play a role stabilizing your spine and bodyweight. By doing these repetitions slowly and correctly instead of doing crunches, you will achieve a flatter belly without a lower pooch. You are also placing your spine in a more neutral position and breathing properly which in turn, decreases your risk for problems related to your pelvic floor, abdominal, and low back such as pelvic organ prolapse, hernias, diastasis recti (separation of superficial muscles), disk herniations, spinal fractures. These exercises set a solid foundation for you to later progress to resistance/ strength training with therabands and weights and return to other typical fitness exercises with a stronger deeper core.  DO= level 1     Stretches :  Figure 4 while seated ankle under knee of supporting leg  5 breaths each side    On your back:   Knee to chest (holdunder thighs ) 5 breaths   Rocking knees side to side 10 x

## 2015-04-05 NOTE — Therapy (Signed)
[place] MAIN [facility] SERVICES [address] [place], [place], [address] Phone: [phone]   Fax:  [phone]  Physical Therapy Treatment  Patient Details  Name: Tracey [name] MRN: [id number] Date of Birth: 1956-01-14 Referring Provider: Dr. Georgianne Fick  Encounter Date: 04/04/2015      PT End of Session - 04/05/15 1018    Visit Number 2   Number of Visits 12   Date for PT Re-Evaluation 06/08/15   PT Start Time 1100   PT Stop Time 1155   PT Time Calculation (min) 55 min   Activity Tolerance Patient tolerated treatment well;No increased pain   Behavior During Therapy [facility] for tasks assessed/performed      Past Medical History  Diagnosis Date  . CAD (coronary artery disease)     a. 04/2008: cariac arrest with prox LAD occ s/p stenting, arctic sun. b. 11/2008: restenosis s/p repeat stenting. c. 01/2011: abnormal stress test but cath reportedly OK.  Marland Kitchen Hypothyroid   . Anxiety   . Myocardial infarction [facility]) 2010    stents and cardiac rehab   . Bronchitis     Past Surgical History  Procedure Laterality Date  . Angioplasty  2010    2 stents (femoral)   . Tubal ligation      There were no vitals filed for this visit.  Visit Diagnosis:  Pelvic floor dysfunction  Lack of coordination  Gait abnormality  Weakness      Subjective Assessment - 04/05/15 1015    Subjective Pt reported she had no complaints from last session.   Pertinent History Hx of tubal ligation, G4004 all vaginal deliveries with stitches and long hours of labor with 1st delivery. Hx of LBP and tailbone  but it does not hurt anymore since taking aspirin after the heart attack. Occupation: sitting 8 hrs with breaks, Physical routine: gym 2x/ week, walking on treadmill 30 min, weight lifting: legs 50-55#, UE 20-25#. Denied crunches/ sit-ups.    Patient Stated Goals 1) stop leakage 2) ease constipation 3) ase abdominal pain             [facility] PT Assessment -  04/05/15 1452    Assessment   Medical Diagnosis SUI   Precautions   Precautions None   Restrictions   Weight Bearing Restrictions No   Observation/Other Assessments   Other Surveys  --  PDFI 54%, PDI 16%    Coordination   Gross Motor Movements are Fluid and Coordinated --  diaphragmatic breathing, limited posterior expansion   Fine Motor Movements are Fluid and Coordinated --  abdominal/ pelvic floor straining w/ bowel movement   Posture/Postural Control   Posture Comments lumbopelvic instability w/ hip flexion   Strength   Overall Strength Comments hip abd sidelying bil 3/5, hip flexion 4-/5 bilaterally   Palpation   Spinal mobility increased midback tensions    Bed Mobility   Bed Mobility --  crunch method   Ambulation/Gait   Gait velocity 0.9 m/s   Gait Comments R trunk lean on R SLS                   Pelvic Floor Special Questions - 04/05/15 1451    Pelvic Floor Internal Exam pt consented verbally without contraindications    Exam Type Vaginal   Palpation R perineal scar w/ restrictions, increased mm tensions at R pelvic floor > L     Biofeedback guided breathing coordination to promote ROM   (post-Tx: increased pelvic floor excursion)  [place] Adult PT Treatment/Exercise - 04/05/15 1459    Neuro Re-ed    Neuro Re-ed Details  pelvic floor coordination, deep core level 1, and stretches (see pt instructions)    Manual Therapy   Internal Pelvic Floor thiele massage and sustained pressure superficial  moderate depth on R pelvic floor mm                 PT Education - 04/05/15 1018    Education provided Yes   Education Details HEP    Person(s) Educated Patient   Methods Explanation;Demonstration;Tactile cues;Verbal cues;Handout   Comprehension Returned demonstration;Verbalized understanding             PT Long Term Goals - 03/23/15 1143    PT LONG TERM GOAL #1   Title Pt will be compliant with increased water intake from zero  amount to 1 glass/ per day for 1 week while decreasing bladder irritant intake from 1/2 cup of coffee and 32 fl oz of tea to 0 cup of coffee and keeping 32 fl oz of tea in order to promote bladder health.    Time 12   Period Weeks   Status New   PT LONG TERM GOAL #2   Title Pt will report decreased  abdominal pain 5/10 to < 3/10 pain after eating in order to improve QOL.    Time 12   Period Weeks   Status New   PT LONG TERM GOAL #3   Title Pt will report [place] Stool Type 3-4 for better bowels and minized constipation.    Time 12   Period Weeks   Status New   PT LONG TERM GOAL #4   Title Pt will decrease her score on [place] from 16% to < 10% in order to increase participation in ADLs and community.    Time 12   Period Weeks   Status New   PT LONG TERM GOAL #5   Title Pt will decrease her score on PFDI from 54% to < 44% in order to improve pelvic floor function   Time 12   Period Weeks   Status New               Plan - 04/05/15 1459    Clinical Impression Statement Pt tolerated intravaginal manual Tx with decreased tenderness and showed improved pelvic floor ROM after Tx and neuro-muscular training.Pt will continue to benefit from skilled PT.    Pt will benefit from skilled therapeutic intervention in order to improve on the following deficits Abnormal gait;Decreased strength;Increased muscle spasms;Improper body mechanics;Postural dysfunction;Difficulty walking;Impaired flexibility;Decreased mobility;Decreased activity tolerance;Decreased balance;Decreased range of motion;Impaired sensation;Decreased coordination;Pain;Decreased safety awareness;Decreased endurance   Rehab Potential Good   PT Frequency 1x / week   PT Duration 12 weeks   PT Treatment/Interventions ADLs/Self Care Home Management;Aquatic Therapy;Cryotherapy;Electrical Stimulation;Moist Heat;Traction;Patient/family education;Neuromuscular re-education;Balance training;Therapeutic exercise;Therapeutic  activities;Functional mobility training;Stair training;Gait training;Manual techniques;Manual lymph drainage;Scar mobilization;Passive range of motion;Energy conservation;Taping   Consulted and Agree with Plan of Care Patient        Problem List Patient Active Problem List   Diagnosis Date Noted  . Dizziness 06/13/2012  . CAD (coronary artery disease) 06/13/2012    Jerl Mina ,PT, DPT, E-RYT  04/05/2015, 3:00 PM  [place] MAIN [facility] SERVICES [address] [place], [place], [address] Phone: [phone]   Fax:  [phone]  Name: Tracey [name] MRN: [id number] Date of Birth: August 25, 1955

## 2015-04-20 ENCOUNTER — Encounter: Payer: Managed Care, Other (non HMO) | Admitting: Physical Therapy

## 2015-04-25 ENCOUNTER — Ambulatory Visit: Payer: Managed Care, Other (non HMO) | Admitting: Physical Therapy

## 2015-04-25 DIAGNOSIS — N8184 Pelvic muscle wasting: Secondary | ICD-10-CM | POA: Diagnosis not present

## 2015-04-25 DIAGNOSIS — R269 Unspecified abnormalities of gait and mobility: Secondary | ICD-10-CM

## 2015-04-25 DIAGNOSIS — M6289 Other specified disorders of muscle: Secondary | ICD-10-CM

## 2015-04-25 DIAGNOSIS — R279 Unspecified lack of coordination: Secondary | ICD-10-CM

## 2015-04-25 DIAGNOSIS — R531 Weakness: Secondary | ICD-10-CM

## 2015-04-25 NOTE — Patient Instructions (Addendum)
Stretches: in the morning/ night / and pre-post-walking  1. Baby pose --> knees to chest with strap   2. Reclined twist   3.Figure 4  4. Cross over    5.Hamstring stretch with strap  (opp knee bent, spread toes of lifted foot)  5a. _ pump knee  10 x  5b._move foot 20-30 deg to lubricate hip joint  W/ straighten knee    Strengthening: 1. Forearm and backward leg circles 10 x, 2 sets/ 1 x day  each leg     2. Clam [place] 45 Degrees: Do 10 timesx 2 sets , each leg, 1 times per day.   Lying with hips and knees bent 45, one pillow between knees and ankles. Lift knee with exhale. Be sure pelvis does not roll backward. Do not arch back. Do 10 times, each leg, 2 times per day.  http://ss.exer.us/75   Copyright  VHI. All rights reserved.     3. Breathing + quick pelvic floor lift 10 x 3 x 1 x day    with pillow under hips

## 2015-04-25 NOTE — Therapy (Signed)
[place] MAIN [facility] SERVICES [address] [place], [place], [address] Phone: [phone]   Fax:  [phone]  Physical Therapy Treatment  Patient Details  Name: Tracey [name] MRN: [id number] Date of Birth: 23-Mar-1955 Referring Provider: Dr. Georgianne Fick  Encounter Date: 04/25/2015      PT End of Session - 04/25/15 1156    Visit Number 3   Number of Visits 12   Date for PT Re-Evaluation 06/08/15   PT Start Time [id number]   PT Stop Time 1145   PT Time Calculation (min) 53 min   Activity Tolerance Patient tolerated treatment well;No increased pain   Behavior During Therapy [facility] for tasks assessed/performed      Past Medical History  Diagnosis Date  . CAD (coronary artery disease)     a. 04/2008: cariac arrest with prox LAD occ s/p stenting, arctic sun. b. 11/2008: restenosis s/p repeat stenting. c. 01/2011: abnormal stress test but cath reportedly OK.  Marland Kitchen Hypothyroid   . Anxiety   . Myocardial infarction [facility]) 2010    stents and cardiac rehab   . Bronchitis     Past Surgical History  Procedure Laterality Date  . Angioplasty  2010    2 stents (femoral)   . Tubal ligation      There were no vitals filed for this visit.  Visit Diagnosis:  Pelvic floor dysfunction  Lack of coordination  Gait abnormality  Weakness      Subjective Assessment - 04/25/15 1055    Subjective Pt reported she had calf soreness on R after walking and it took her 3 days to recover. Pt reports learning how coordinate coughing withh pelvic floor muscles to minimize leakage.    Pertinent History Hx of tubal ligation, G4004 all vaginal deliveries with stitches and long hours of labor with 1st delivery. Hx of LBP and tailbone  but it does not hurt anymore since taking aspirin after the heart attack. Occupation: sitting 8 hrs with breaks, Physical routine: gym 2x/ week, walking on treadmill 30 min, weight lifting: legs 50-55#, UE 20-25#. Denied crunches/ sit-ups.    Patient Stated Goals 1) stop leakage 2) ease constipation 3) ase abdominal pain             [facility] PT Assessment - 04/25/15 1150    Flexibility   Hamstrings pre-Tx: 160 deg , post-Tx 180 deg  Right LEG   Palpation   Palpation comment increased mm tensions at semimembranosus / tendinosus RLE    Ambulation/Gait   Gait Comments R trunk sway decreased post Tx                  Pelvic Floor Special Questions - 04/25/15 1152    Pelvic Floor Internal Exam pt consented verbally without contraindications    Exam Type Vaginal   Palpation no mm tensions Bilaterally   Strength weak squeeze, no lift  adductor oversuse, required pillow under hips   Biofeedback pillow under hips faciliated awareness of closure of contraction mm and more caudal position of bladder           [facility] Adult PT Treatment/Exercise - 04/25/15 1150    Neuro Re-ed    Neuro Re-ed Details  pelvic floor ROM with pillow under hips (deep core level 1 + quick aqueeze)    Exercises   Exercises --  see pt instructions (stretches and glut med strengthening)   Manual Therapy   Manual therapy comments distraction of leg, STM at medial hamstrings  Internal Pelvic Floor faciliation of anterior triangle                 PT Education - 04/25/15 1057    Education provided Yes   Education Details HEP   Methods Explanation;Demonstration;Tactile cues;Verbal cues;Handout   Comprehension Verbalized understanding;Returned demonstration             PT Long Term Goals - 04/25/15 1159    PT LONG TERM GOAL #1   Title Pt will be compliant with increased water intake from zero amount to 1 glass/ per day for 1 week while decreasing bladder irritant intake from 1/2 cup of coffee and 32 fl oz of tea to 0 cup of coffee and keeping 32 fl oz of tea in order to promote bladder health.    Time 12   Period Weeks   Status On-going   PT LONG TERM GOAL #2   Title Pt will report decreased  abdominal pain 5/10 to < 3/10  pain after eating in order to improve QOL.    Time 12   Period Weeks   Status On-going   PT LONG TERM GOAL #3   Title Pt will report [place] Stool Type 3-4 for better bowels and minized constipation.    Time 12   Period Weeks   Status On-going   PT LONG TERM GOAL #4   Title Pt will decrease her score on [place] from 16% to < 10% in order to increase participation in ADLs and community.    Time 12   Period Weeks   Status On-going   PT LONG TERM GOAL #5   Title Pt will decrease her score on PFDI from 54% to < 44% in order to improve pelvic floor function   Time 12   Period Weeks   Status On-going   Additional Long Term Goals   Additional Long Term Goals Yes   PT LONG TERM GOAL #6   Title Pt will demo no trunk sway and improve gait speed from 0.9 m/s to > 1.2 m /s in order to walk and exercise to lose weight.    Time 12   Period Weeks   Status New               Plan - 04/25/15 1156    Clinical Impression Statement Pt showed good carry over with no pelvic floor mm tenderness/tension bilaterally today. Pt progressed to proper pelvic floor ROM and coordination training but required a pillow under hips to faciliate a  more caudal position of bladder and increased awareness of pelvic floor contraction. Addressed  RLE  weakness,mm imbalances, habit of  Adducting thighs. Pt achieved full hamstring flexibility post manual Tx. Pt's gait improved post Tx and antipicate she will continue to improve her walking with hip abd mm strengthening and propiocption training of lower kinetic chain in order to minimize mm imbalances and soreness.  Pt will continue to benefit from skilled PT to progress towards goals.    Pt will benefit from skilled therapeutic intervention in order to improve on the following deficits Abnormal gait;Decreased strength;Increased muscle spasms;Improper body mechanics;Postural dysfunction;Difficulty walking;Impaired flexibility;Decreased mobility;Decreased activity  tolerance;Decreased balance;Decreased range of motion;Impaired sensation;Decreased coordination;Pain;Decreased safety awareness;Decreased endurance   Rehab Potential Good   PT Frequency 1x / week   PT Duration 12 weeks   PT Treatment/Interventions ADLs/Self Care Home Management;Aquatic Therapy;Cryotherapy;Electrical Stimulation;Moist Heat;Traction;Patient/family education;Neuromuscular re-education;Balance training;Therapeutic exercise;Therapeutic activities;Functional mobility training;Stair training;Gait training;Manual techniques;Manual lymph drainage;Scar mobilization;Passive range of motion;Energy conservation;Taping   Consulted and  Agree with Plan of Care Patient        Problem List Patient Active Problem List   Diagnosis Date Noted  . Dizziness 06/13/2012  . CAD (coronary artery disease) 06/13/2012    Jerl Mina ,PT, DPT, E-RYT  04/25/2015, 12:01 PM  [place] MAIN [facility] SERVICES [address] [place], [place], [address] Phone: [phone]   Fax:  [phone]  Name: Tracey [name] MRN: [id number] Date of Birth: 1955/07/26

## 2015-04-27 ENCOUNTER — Encounter: Payer: Managed Care, Other (non HMO) | Admitting: Physical Therapy

## 2015-05-02 ENCOUNTER — Ambulatory Visit: Payer: Managed Care, Other (non HMO) | Attending: Obstetrics and Gynecology | Admitting: Physical Therapy

## 2015-05-02 DIAGNOSIS — R269 Unspecified abnormalities of gait and mobility: Secondary | ICD-10-CM

## 2015-05-02 DIAGNOSIS — R531 Weakness: Secondary | ICD-10-CM | POA: Insufficient documentation

## 2015-05-02 DIAGNOSIS — M6289 Other specified disorders of muscle: Secondary | ICD-10-CM

## 2015-05-02 DIAGNOSIS — R2689 Other abnormalities of gait and mobility: Secondary | ICD-10-CM | POA: Insufficient documentation

## 2015-05-02 DIAGNOSIS — M6281 Muscle weakness (generalized): Secondary | ICD-10-CM | POA: Insufficient documentation

## 2015-05-02 DIAGNOSIS — N8184 Pelvic muscle wasting: Secondary | ICD-10-CM | POA: Diagnosis present

## 2015-05-02 DIAGNOSIS — R279 Unspecified lack of coordination: Secondary | ICD-10-CM | POA: Diagnosis present

## 2015-05-02 NOTE — Therapy (Signed)
[place] MAIN [facility] SERVICES [address] [place], [place], [address] Phone: [phone]   Fax:  [phone]  Physical Therapy Treatment  Patient Details  Name: Tracey [name] MRN: [id number] Date of Birth: 04/13/1955 Referring Provider: Dr. Georgianne Fick  Encounter Date: 05/02/2015      PT End of Session - 05/02/15 1357    Visit Number 4   Number of Visits 12   Date for PT Re-Evaluation 06/08/15   PT Start Time 1300   PT Stop Time 1400   PT Time Calculation (min) 60 min   Activity Tolerance Patient tolerated treatment well;No increased pain   Behavior During Therapy [facility] for tasks assessed/performed      Past Medical History  Diagnosis Date  . CAD (coronary artery disease)     a. 04/2008: cariac arrest with prox LAD occ s/p stenting, arctic sun. b. 11/2008: restenosis s/p repeat stenting. c. 01/2011: abnormal stress test but cath reportedly OK.  Marland Kitchen Hypothyroid   . Anxiety   . Myocardial infarction [facility]) 2010    stents and cardiac rehab   . Bronchitis     Past Surgical History  Procedure Laterality Date  . Angioplasty  2010    2 stents (femoral)   . Tubal ligation      There were no vitals filed for this visit.  Visit Diagnosis:  Pelvic floor dysfunction  Lack of coordination  Gait abnormality  Weakness      Subjective Assessment - 05/02/15 1304    Subjective Pt reports her abdominal pain has improved by 100% for the past month. Pt's urine leakage has improved by 50% and is less with coughing, sneezing. Leakage is occuring with any movement.  She is walking much better and notices she is not leaning side to side as much.    Pertinent History Hx of tubal ligation, G4004 all vaginal deliveries with stitches and long hours of labor with 1st delivery. Hx of LBP and tailbone  but it does not hurt anymore since taking aspirin after the heart attack. Occupation: sitting 8 hrs with breaks, Physical routine: gym 2x/ week, walking on  treadmill 30 min, weight lifting: legs 50-55#, UE 20-25#. Denied crunches/ sit-ups.    Patient Stated Goals 1) stop leakage 2) ease constipation 3) ase abdominal pain             [facility] PT Assessment - 05/02/15 1346    AROM   Overall AROM Comments Hip IR on L 20 deg, R 30 deg    Strength   Overall Strength Comments ankle eversion, inversion, PF, DF WNL builaterally    Palpation   SI assessment  tenderness and limited ROM at L PSIS    Ambulation/Gait   Gait Comments decreased eversion on R due to avoidance of pain with bunion. Cue for more eversion/pronation on R 1st metatarsal and pt demo'd increased postural stability   R genu hallux/bunion                   Pelvic Floor Special Questions - 05/02/15 1350    External Perineal Exam through clothing   External Palpation no pelvic floor tensions noted           [facility] Adult PT Treatment/Exercise - 05/02/15 1346    Neuro Re-ed    Neuro Re-ed Details  deep core elvel 2 , 30 reps   gait training   Exercises   Exercises --  see pt instructions  PT Education - 05/02/15 1357    Education provided Yes   Education Details HEP   Person(s) Educated Patient   Methods Explanation;Demonstration;Tactile cues;Verbal cues;Handout   Comprehension Verbalized understanding;Returned demonstration             PT Long Term Goals - 05/02/15 1513    PT LONG TERM GOAL #1   Title Pt will be compliant with increased water intake from zero amount to 1 glass/ per day for 1 week while decreasing bladder irritant intake from 1/2 cup of coffee and 32 fl oz of tea to 0 cup of coffee and keeping 32 fl oz of tea in order to promote bladder health.    Time 12   Period Weeks   Status Achieved   PT LONG TERM GOAL #2   Title Pt will report decreased  abdominal pain 5/10 to < 3/10 pain after eating in order to improve QOL. (05/02/15: no abdominal pain for the past month)    Time 12   Period Weeks   Status Achieved    PT LONG TERM GOAL #3   Title Pt will report [place] Stool Type 3-4 for better bowels and minized constipation.    Time 12   Period Weeks   Status Partially Met   PT LONG TERM GOAL #4   Title Pt will decrease her score on [place] from 16% to < 10% in order to increase participation in ADLs and community.  (05/02/15: 0%)    Time 12   Period Weeks   Status Achieved   PT LONG TERM GOAL #5   Title Pt will decrease her score on PFDI from 54% to < 44% in order to improve pelvic floor function   Time 12   Period Weeks   Status On-going   PT LONG TERM GOAL #6   Title Pt will demo no trunk sway and improve gait speed from 0.9 m/s to > 1.2 m /s in order to walk and exercise to lose weight.    Time 12   Period Weeks   Status Achieved   PT LONG TERM GOAL #7   Title Pt will demo SLS for > 10 reps without UE support on wall bilaterally in order to improve balance and feet/ankle strength to minimize ankle strains and gait deviations   Time 12   Period Weeks   Status New               Plan - 05/02/15 1358    Clinical Impression Statement Pt has achieved 4/7 goals. Pt's abdominal pain has resolved for the past month. Pt is progressing well towards her remaining goals related to pelvic floor, pelvic girdle, LE dysfunctions. Pt showed no pelvic floor mm tensions through external palpation. However, pt showed pelvic obliquities due to gait deviations related to R foot bunion and genu hallux. Pt demo'd increased hip ROM and improved propioception of feet mechanics post Tx. Progressed deep core exercises to address urinary leakage and bypassing pelvic floor strengthening due to pt's previous disposition of pelvic floor mm tensions and pelvic girdle mm tightness. Pt will continue to benefit skilled PT.      Pt will benefit from skilled therapeutic intervention in order to improve on the following deficits Abnormal gait;Decreased strength;Increased muscle spasms;Improper body mechanics;Postural  dysfunction;Difficulty walking;Impaired flexibility;Decreased mobility;Decreased activity tolerance;Decreased balance;Decreased range of motion;Impaired sensation;Decreased coordination;Pain;Decreased safety awareness;Decreased endurance   Rehab Potential Good   PT Frequency 1x / week   PT Duration 12 weeks   PT Treatment/Interventions  ADLs/Self Care Home Management;Aquatic Therapy;Cryotherapy;Electrical Stimulation;Moist Heat;Traction;Patient/family education;Neuromuscular re-education;Balance training;Therapeutic exercise;Therapeutic activities;Functional mobility training;Stair training;Gait training;Manual techniques;Manual lymph drainage;Scar mobilization;Passive range of motion;Energy conservation;Taping   Consulted and Agree with Plan of Care Patient        Problem List Patient Active Problem List   Diagnosis Date Noted  . Dizziness 06/13/2012  . CAD (coronary artery disease) 06/13/2012    Jerl Mina ,PT, DPT, E-RYT  05/02/2015, 3:16 PM  [place] MAIN [facility] SERVICES [address] [place], [place], [address] Phone: [phone]   Fax:  [phone]  Name: Tracey [name] MRN: [id number] Date of Birth: [birth year]

## 2015-05-02 NOTE — Patient Instructions (Signed)
Deep core level 2   30 reps  X 2 x day   Stretches for pelvic girdle:  Only on L this week  Knee extended, opp knee bent for stability:  Strap on [place] and moving inside midline 20 deg, 20 deg outside midline to lubricate the hip socket 10 x    Single knee to chest   5 breaths     Reclined twist:  _scoot hips to R and hold strap on R hand, drop L knee to R   __progress to dropping completely over with R leg straight  10 breaths    Strengthening feet/ ankles :   Hand on wall, heel raises 10 x 3 sets/ 2-3 x day

## 2015-05-09 ENCOUNTER — Ambulatory Visit: Payer: Managed Care, Other (non HMO) | Admitting: Physical Therapy

## 2015-05-09 ENCOUNTER — Encounter: Payer: Managed Care, Other (non HMO) | Admitting: Physical Therapy

## 2015-05-16 ENCOUNTER — Ambulatory Visit: Payer: Managed Care, Other (non HMO) | Admitting: Physical Therapy

## 2015-05-16 DIAGNOSIS — R279 Unspecified lack of coordination: Secondary | ICD-10-CM

## 2015-05-16 DIAGNOSIS — M6281 Muscle weakness (generalized): Secondary | ICD-10-CM

## 2015-05-16 DIAGNOSIS — N8184 Pelvic muscle wasting: Secondary | ICD-10-CM | POA: Diagnosis not present

## 2015-05-16 DIAGNOSIS — R2689 Other abnormalities of gait and mobility: Secondary | ICD-10-CM

## 2015-05-16 NOTE — Patient Instructions (Signed)
Enjoy progressing with deep core level 3 (marching) 30 reps /day for 2-3 weeks. After that, you can try to see if you can maintain stability of the trunk when trying out Level 4 (heel slide).   These exercises will help with the urinary issue with time.   Also keep up with the heel raises (pedicure insert between R toes when at home). 10 x 3 sets a day.  After 1-2 months, see if it gets easier with one leg while holding to wall and eventually after a few months, without wall.  Perform calf stretches after doing them.   Body scan (recording) attached in email.

## 2015-05-17 NOTE — Therapy (Signed)
[place] MAIN [facility] SERVICES [address] [place], [place], [address] Phone: [phone]   Fax:  [phone]  Physical Therapy Treatment / Discharge Summary  Patient Details  Name: Tracey [name] MRN: [id number] Date of Birth: 1955/06/03 Referring Provider: Dr. Georgianne Fick  Encounter Date: 05/16/2015      PT End of Session - 05/17/15 2337    Visit Number 5   Number of Visits 12   Date for PT Re-Evaluation 06/08/15   PT Start Time 1310   PT Stop Time 1350   PT Time Calculation (min) 40 min   Activity Tolerance Patient tolerated treatment well;No increased pain   Behavior During Therapy [facility] for tasks assessed/performed      Past Medical History  Diagnosis Date  . CAD (coronary artery disease)     a. 04/2008: cariac arrest with prox LAD occ s/p stenting, arctic sun. b. 11/2008: restenosis s/p repeat stenting. c. 01/2011: abnormal stress test but cath reportedly OK.  Marland Kitchen Hypothyroid   . Anxiety   . Myocardial infarction [facility]) 2010    stents and cardiac rehab   . Bronchitis     Past Surgical History  Procedure Laterality Date  . Angioplasty  2010    2 stents (femoral)   . Tubal ligation      There were no vitals filed for this visit.      Subjective Assessment - 05/17/15 2333    Subjective Pt reports her abdominal resolved for the past weeks. Her bowel movements have improved but her urinary issues have been worsened recently with a coughing spell. Pt would like to make today her last session due to feeling stressed with increased work duties.    Pertinent History Hx of tubal ligation, [id number] all vaginal deliveries with stitches and long hours of labor with 1st delivery. Hx of LBP and tailbone  but it does not hurt anymore since taking aspirin after the heart attack. Occupation: sitting 8 hrs with breaks, Physical routine: gym 2x/ week, walking on treadmill 30 min, weight lifting: legs 50-55#, UE 20-25#. Denied crunches/ sit-ups.    Patient Stated Goals 1) stop leakage 2) ease constipation 3) ase abdominal pain                       Pelvic Floor Special Questions - 05/17/15 2338    Pelvic Floor Internal Exam pt consented verbally without contraindications    Exam Type Vaginal   Palpation no mm tensions Bilaterally   Strength fair squeeze, definite lift   Biofeedback without pillow            [facility] Adult PT Treatment/Exercise - 05/17/15 2349    Therapeutic Activites    Therapeutic Activities --  see pt instructions   Neuro Re-ed    Neuro Re-ed Details  see pt instructions                     PT Long Term Goals - 05/16/15 1310    PT LONG TERM GOAL #1   Title Pt will be compliant with increased water intake from zero amount to 1 glass/ per day for 1 week while decreasing bladder irritant intake from 1/2 cup of coffee and 32 fl oz of tea to 0 cup of coffee and keeping 32 fl oz of tea in order to promote bladder health.    Time 12   Period Weeks   Status Achieved   PT LONG TERM GOAL #2  Title Pt will report decreased  abdominal pain 5/10 to < 3/10 pain after eating in order to improve QOL. (05/02/15: no abdominal pain for the past month)    Time 12   Period Weeks   Status Achieved   PT LONG TERM GOAL #3   Title Pt will report [place] Stool Type 3-4 for better bowels and minized constipation.    Time 12   Period Weeks   Status Achieved   PT LONG TERM GOAL #4   Title Pt will decrease her score on [place] from 16% to < 10% in order to increase participation in ADLs and community.  (05/02/15: 0%)    Time 12   Period Weeks   Status Achieved   PT LONG TERM GOAL #5   Title Pt will decrease her score on PFDI from 54% to < 44% in order to improve pelvic floor function (05/16/15: 38%)    Time 12   Period Weeks   Status Achieved   PT LONG TERM GOAL #6   Title Pt will demo no trunk sway and improve gait speed from 0.9 m/s to > 1.2 m /s in order to walk and exercise to lose weight.    Time 12    Period Weeks   Status Achieved   PT LONG TERM GOAL #7   Title Pt will demo SLS for > 10 reps without UE support on wall bilaterally in order to improve balance and feet/ankle strength to minimize ankle strains and gait deviations   Time 12   Period Weeks   Status Partially Met               Plan - 05/17/15 2336    Clinical Impression Statement Pt reports her Sx have improved "A great deal better" since [facility] Pt has achieved 6/7 goals. Her PFDI score decreased from 54% to 38%. She has reported increased water intake, more regular bowel movemments, resolved abdominal pain, and increased gait speed with  less gait deviations.  Pt is self-discharging at this time due to increased workload and stress. Pt was educated on minimize risk for relapse of hyperactivity of pelvic floor with regular relaxation practices and progression of deep core exercises to further decrease SUI. Pt demo'd  understanding and correct form . Pt was progressing well towards her remaining goal related to balance and leg /hip strength and was provided an exercise to maintain after d/c. Pt was pleasant to work with and remained compliant. Thank you for your referral.    Rehab Potential Good   PT Frequency 1x / week   PT Duration 12 weeks   PT Treatment/Interventions ADLs/Self Care Home Management;Aquatic Therapy;Cryotherapy;Electrical Stimulation;Moist Heat;Traction;Patient/family education;Neuromuscular re-education;Balance training;Therapeutic exercise;Therapeutic activities;Functional mobility training;Stair training;Gait training;Manual techniques;Manual lymph drainage;Scar mobilization;Passive range of motion;Energy conservation;Taping   Consulted and Agree with Plan of Care Patient      Patient will benefit from skilled therapeutic intervention in order to improve the following deficits and impairments:  Abnormal gait, Decreased strength, Increased muscle spasms, Improper body mechanics, Postural dysfunction,  Difficulty walking, Impaired flexibility, Decreased mobility, Decreased activity tolerance, Decreased balance, Decreased range of motion, Impaired sensation, Decreased coordination, Pain, Decreased safety awareness, Decreased endurance  Visit Diagnosis: Unspecified lack of coordination  Other abnormalities of gait and mobility  Muscle weakness (generalized)     Problem List Patient Active Problem List   Diagnosis Date Noted  . Dizziness 06/13/2012  . CAD (coronary artery disease) 06/13/2012    Jerl Mina ,PT, DPT, E-RYT  05/17/2015, 11:59  PM  [place] MAIN [facility] SERVICES [address] [place], [place], [address] Phone: [phone]   Fax:  [phone]  Name: KENIKA SAHM MRN: [id number] Date of Birth: March 02, 1955

## 2015-05-23 ENCOUNTER — Encounter: Payer: Managed Care, Other (non HMO) | Admitting: Physical Therapy

## 2015-05-30 ENCOUNTER — Encounter: Payer: Managed Care, Other (non HMO) | Admitting: Physical Therapy

## 2015-08-09 DIAGNOSIS — E039 Hypothyroidism, unspecified: Secondary | ICD-10-CM | POA: Insufficient documentation

## 2015-08-09 DIAGNOSIS — E782 Mixed hyperlipidemia: Secondary | ICD-10-CM | POA: Insufficient documentation

## 2015-08-09 DIAGNOSIS — I255 Ischemic cardiomyopathy: Secondary | ICD-10-CM | POA: Insufficient documentation

## 2015-11-21 DIAGNOSIS — E538 Deficiency of other specified B group vitamins: Secondary | ICD-10-CM | POA: Insufficient documentation

## 2018-08-25 DIAGNOSIS — R7989 Other specified abnormal findings of blood chemistry: Secondary | ICD-10-CM | POA: Insufficient documentation

## 2018-08-31 ENCOUNTER — Other Ambulatory Visit: Payer: Self-pay | Admitting: Internal Medicine

## 2018-08-31 DIAGNOSIS — Z1231 Encounter for screening mammogram for malignant neoplasm of breast: Secondary | ICD-10-CM

## 2018-10-17 ENCOUNTER — Emergency Department
Admission: EM | Admit: 2018-10-17 | Discharge: 2018-10-17 | Disposition: A | Discharge status: Home / Self Care | Payer: 59 | Attending: Emergency Medicine | Admitting: Emergency Medicine

## 2018-10-17 ENCOUNTER — Emergency Department: Payer: 59

## 2018-10-17 ENCOUNTER — Other Ambulatory Visit: Payer: Self-pay

## 2018-10-17 ENCOUNTER — Encounter: Payer: Self-pay | Admitting: Emergency Medicine

## 2018-10-17 DIAGNOSIS — Z7982 Long term (current) use of aspirin: Secondary | ICD-10-CM | POA: Diagnosis not present

## 2018-10-17 DIAGNOSIS — W19XXXA Unspecified fall, initial encounter: Secondary | ICD-10-CM

## 2018-10-17 DIAGNOSIS — Z87891 Personal history of nicotine dependence: Secondary | ICD-10-CM | POA: Diagnosis not present

## 2018-10-17 DIAGNOSIS — E039 Hypothyroidism, unspecified: Secondary | ICD-10-CM | POA: Insufficient documentation

## 2018-10-17 DIAGNOSIS — I2510 Atherosclerotic heart disease of native coronary artery without angina pectoris: Secondary | ICD-10-CM | POA: Diagnosis not present

## 2018-10-17 DIAGNOSIS — M79662 Pain in left lower leg: Secondary | ICD-10-CM | POA: Diagnosis not present

## 2018-10-17 DIAGNOSIS — Z79899 Other long term (current) drug therapy: Secondary | ICD-10-CM | POA: Insufficient documentation

## 2018-10-17 MED ORDER — IBUPROFEN 600 MG PO TABS: 600.0000 mg | ORAL_TABLET | Freq: Three times a day (TID) | ORAL | 0 refills | Status: DC | PRN

## 2018-10-17 NOTE — ED Notes (Signed)
First Nurse Note: Pt states that she fell a few days ago. Is now having trouble walking. Pt is in NAD.

## 2018-10-17 NOTE — ED Notes (Addendum)
See triage note  Presents s/p fall on Monday  States developed pain to left leg on weds  Hx of DVT's

## 2018-10-17 NOTE — ED Provider Notes (Signed)
[facility] Emergency Department Provider Note ____________________________________________  Time seen: 1345  I have reviewed the triage vital signs and the nursing notes.  HISTORY  Chief Complaint  Fall   HPI Tracey [name] is a 63 y.o. female presents to the ER today with c/o pain and swelling of the left lower leg. She reports she fell 4 days ago. She reports increased pain and swelling for the last 2 days. She describes the pain as achy but can be sharp with ambulation. She has not noticed any redness, numbness or tingling in the left leg but is having some weakness due to the pain. She has take Tylenol with minimal relief. She has a history of blood clots that caused an MI in the past.  Past Medical History:  Diagnosis Date  . Anxiety   . Bronchitis   . CAD (coronary artery disease)    a. 04/2008: cariac arrest with prox LAD occ s/p stenting, arctic sun. b. 11/2008: restenosis s/p repeat stenting. c. 01/2011: abnormal stress test but cath reportedly OK.  Marland Kitchen Hypothyroid   . Myocardial infarction [facility]) 2010   stents and cardiac rehab     Patient Active Problem List   Diagnosis Date Noted  . Dizziness 06/13/2012  . CAD (coronary artery disease) 06/13/2012    Past Surgical History:  Procedure Laterality Date  . ANGIOPLASTY  2010   2 stents (femoral)   . TUBAL LIGATION      Prior to Admission medications   Medication Sig Start Date End Date Taking? Authorizing Provider  aspirin EC 81 MG tablet Take 81 mg by mouth daily.    [provider]  citalopram (CELEXA) 10 MG tablet Take 10 mg by mouth daily.    [provider]  fluticasone (FLONASE) 50 MCG/ACT nasal spray Place 2 sprays into the nose daily.    [provider]  ibuprofen (ADVIL) 600 MG tablet Take 1 tablet (600 mg total) by mouth every 8 (eight) hours as needed. 10/17/18   Jearld Fenton, NP  levothyroxine (SYNTHROID, LEVOTHROID) 25 MCG tablet Take 25 mcg by mouth  daily before breakfast.    [provider]  metoprolol succinate (TOPROL-XL) 25 MG 24 hr tablet Take 25 mg by mouth daily.    [provider]  nitroGLYCERIN (NITROSTAT) 0.4 MG SL tablet Place 0.4 mg under the tongue every 5 (five) minutes as needed for chest pain.    [provider]  pantoprazole (PROTONIX) 40 MG tablet Take 40 mg by mouth daily.    [provider]  ramipril (ALTACE) 5 MG capsule Take 5 mg by mouth daily.    [provider]  rosuvastatin (CRESTOR) 40 MG tablet Take 40 mg by mouth daily.    [provider]    Allergies Sulfa antibiotics  No family history on file.  Social History Social History   Tobacco Use  . Smoking status: Former Smoker    Packs/day: 1.50    Types: Cigarettes    Start date: 03/22/1973    Quit date: 04/27/2008    Years since quitting: 10.4  . Smokeless tobacco: Never Used  Substance Use Topics  . Alcohol use: No    Alcohol/week: 0.0 standard drinks  . Drug use: Not on file    Review of Systems  Constitutional: Negative for fever, chills or body aches. Cardiovascular: Negative for chest pain or chest tightness. Respiratory: Negative for shortness of breath. Musculoskeletal: Positive for left lower leg pain and swelling, pain with  ambulation. Skin: Negative for bruising, abrasion, redness. Neurological: Positive for focal weakness of the LLE. Negative for tingling or numbness. ____________________________________________  PHYSICAL EXAM:  VITAL SIGNS: ED Triage Vitals  Enc Vitals Group     BP 10/17/18 1200 120/74     Pulse Rate 10/17/18 1200 82     Resp 10/17/18 1200 16     Temp 10/17/18 1200 98.5 F (36.9 C)     Temp Source 10/17/18 1200 Oral     SpO2 10/17/18 1200 95 %     Weight --      Height --      Head Circumference --      Peak Flow --      Pain Score 10/17/18 1205 0     Pain Loc --      Pain Edu? --      Excl. in [place]? --     Constitutional: Alert and oriented.  Obese, in no distress. Cardiovascular: Normal rate, regular rhythm. Pedal pulses 1+ bilaterally.  Respiratory: Normal respiratory effort. No wheezes/rales/rhonchi. Musculoskeletal: Normal extension of the left knee. Decreased full flexion of the left knee (known arthritis). Pain with palpation over the distal tibia. No joint swelling noted. Neurologic:  Normal gait without ataxia. Normal speech and language. No gross focal neurologic deficits are appreciated. Skin:  Skin is warm, dry and intact. No redness, rash or abrasion noted. ____________________________________________   RADIOLOGY  Imaging Orders     US Venous Img Lower Unilateral Left     DG Tibia/Fibula Left   IMPRESSION:  No femoropopliteal and no calf DVT in the visualized calf veins. If  clinical symptoms are inconsistent or if there are persistent or  worsening symptoms, further imaging (possibly involving the iliac  veins) may be warranted.   ___ IMPRESSION:  No fracture or dislocation of the left tibia or fibula.   _________________________________________  PROCEDURES   INITIAL IMPRESSION / ASSESSMENT AND PLAN / ED COURSE  Left Leg Pain s/p Fall:  [place] LLE r/o DVT negative Will obtain xray left tib/fib negative Encouraged ice and elevation RX for Ibuprofen 600 mg TID prn with food Follow up with PCP if pain persists or worsens     ____________________________________________  FINAL CLINICAL IMPRESSION(S) / ED DIAGNOSES  Final diagnoses:  Fall, initial encounter  Pain in left lower leg   Webb Silversmith, NP    Jearld Fenton, NP 10/17/18 [place]    Carrie Mew, MD 10/17/18 1526

## 2018-10-17 NOTE — Discharge Instructions (Addendum)
You were seen today with for left lower leg pain s/p fall. The ultrasound of your left leg was negative for a blood clot. The xray of your left leg was negative for fracture. At this point, we would recommend rest, ice, and elevation. I have given you a RX for Ibuprofen 600 mg to take every 8 hours as needed with food. Follow up with your PCP if pain persists or worsens.

## 2018-10-17 NOTE — ED Triage Notes (Signed)
States fell on Monday and initially felt fine.  Since Wednesday, c/o right lower leg pain when walking   Does have history of a blood clot to LAD and caused MI.  Right lower leg slightly cooler to touch than left.  Dorsalis Pedis pulse equal bilaterally +1.

## 2018-10-22 ENCOUNTER — Ambulatory Visit: Payer: Self-pay | Admitting: Obstetrics and Gynecology

## 2018-10-25 ENCOUNTER — Other Ambulatory Visit: Payer: Self-pay | Admitting: Obstetrics and Gynecology

## 2018-10-25 DIAGNOSIS — Z1231 Encounter for screening mammogram for malignant neoplasm of breast: Secondary | ICD-10-CM

## 2018-12-01 ENCOUNTER — Ambulatory Visit
Admission: RE | Admit: 2018-12-01 | Discharge: 2018-12-01 | Disposition: A | Discharge status: Home / Self Care | Payer: 59 | Source: Ambulatory Visit | Attending: Obstetrics and Gynecology | Admitting: Obstetrics and Gynecology

## 2018-12-01 DIAGNOSIS — Z1231 Encounter for screening mammogram for malignant neoplasm of breast: Secondary | ICD-10-CM | POA: Insufficient documentation

## 2019-07-20 ENCOUNTER — Ambulatory Visit: Payer: Self-pay | Admitting: Obstetrics and Gynecology

## 2019-09-26 ENCOUNTER — Other Ambulatory Visit: Payer: Self-pay

## 2019-09-26 ENCOUNTER — Ambulatory Visit (INDEPENDENT_AMBULATORY_CARE_PROVIDER_SITE_OTHER): Payer: 59 | Admitting: Vascular Surgery

## 2019-09-26 ENCOUNTER — Ambulatory Visit (INDEPENDENT_AMBULATORY_CARE_PROVIDER_SITE_OTHER): Payer: 59

## 2019-09-26 ENCOUNTER — Other Ambulatory Visit (INDEPENDENT_AMBULATORY_CARE_PROVIDER_SITE_OTHER): Payer: Self-pay | Admitting: Vascular Surgery

## 2019-09-26 ENCOUNTER — Encounter (INDEPENDENT_AMBULATORY_CARE_PROVIDER_SITE_OTHER): Payer: Self-pay | Admitting: Vascular Surgery

## 2019-09-26 VITALS — BP 129/82 | HR 67 | Ht 65.0 in | Wt 217.0 lb

## 2019-09-26 DIAGNOSIS — I739 Peripheral vascular disease, unspecified: Secondary | ICD-10-CM | POA: Diagnosis not present

## 2019-09-26 DIAGNOSIS — K219 Gastro-esophageal reflux disease without esophagitis: Secondary | ICD-10-CM | POA: Insufficient documentation

## 2019-09-26 DIAGNOSIS — I25118 Atherosclerotic heart disease of native coronary artery with other forms of angina pectoris: Secondary | ICD-10-CM | POA: Diagnosis not present

## 2019-09-26 DIAGNOSIS — E782 Mixed hyperlipidemia: Secondary | ICD-10-CM

## 2019-09-26 DIAGNOSIS — I70213 Atherosclerosis of native arteries of extremities with intermittent claudication, bilateral legs: Secondary | ICD-10-CM | POA: Diagnosis not present

## 2019-10-05 ENCOUNTER — Encounter (INDEPENDENT_AMBULATORY_CARE_PROVIDER_SITE_OTHER): Payer: Self-pay | Admitting: Vascular Surgery

## 2019-10-05 DIAGNOSIS — I70219 Atherosclerosis of native arteries of extremities with intermittent claudication, unspecified extremity: Secondary | ICD-10-CM | POA: Insufficient documentation

## 2019-10-05 NOTE — Progress Notes (Signed)
MRN : [id number]  Tracey [name] is a 64 y.o. (11-30-1955) female who presents with chief complaint of  Chief Complaint  Patient presents with  . New Patient (Initial Visit)    ABI  .  History of Present Illness:    The patient is seen for evaluation of painful lower extremities and diminished pulses. Patient notes the pain is always associated with activity and is very consistent day today. Typically, the pain occurs at less than one block, progress is as activity continues to the point that the patient must stop walking. Resting including standing still for several minutes allowed resumption of the activity and the ability to walk a similar distance before stopping again. Uneven terrain and inclined shorten the distance. The pain has been progressive over the past several years. The patient states the inability to walk is now having a profound negative impact on quality of life and daily activities.  The patient denies rest pain or dangling of an extremity off the side of the bed during the night for relief. No open wounds or sores at this time. No prior interventions or surgeries.  No history of back problems or DJD of the lumbar sacral spine.   The patient denies changes in claudication symptoms or new rest pain symptoms.  No new ulcers or wounds of the foot.  The patient's blood pressure has been stable and relatively well controlled. The patient denies amaurosis fugax or recent TIA symptoms. There are no recent neurological changes noted. The patient denies history of DVT, PE or superficial thrombophlebitis. The patient denies recent episodes of angina or shortness of breath.   ABI's obtained today show Rt=1.06 and Lt=0.62 (monophasic)  Current Meds  Medication Sig  . aspirin EC 81 MG tablet Take 81 mg by mouth daily.  . Cholecalciferol 50 MCG (2000 UT) TABS Take by mouth.  . citalopram (CELEXA) 10 MG tablet Take 10 mg by mouth daily.  . cyanocobalamin (,VITAMIN B-12,)  1000 MCG/ML injection INJECT 1.5 MLS INTO THE MUSCLE ONCE MONTHLY  . fluticasone (FLONASE) 50 MCG/ACT nasal spray Place 2 sprays into the nose daily.  . furosemide (LASIX) 20 MG tablet Take by mouth.  . levothyroxine (SYNTHROID, LEVOTHROID) 25 MCG tablet Take 25 mcg by mouth daily before breakfast.  . metoprolol succinate (TOPROL-XL) 25 MG 24 hr tablet Take 25 mg by mouth daily.  . pantoprazole (PROTONIX) 40 MG tablet Take 40 mg by mouth daily.  . potassium chloride (MICRO-K) 10 MEQ CR capsule Take two tabs by mouth with every dose of as needed lasix (furosemide).  . ramipril (ALTACE) 5 MG capsule Take 5 mg by mouth daily.  . rosuvastatin (CRESTOR) 40 MG tablet Take 40 mg by mouth daily.    Past Medical History:  Diagnosis Date  . Anxiety   . Bronchitis   . CAD (coronary artery disease)    a. 04/2008: cariac arrest with prox LAD occ s/p stenting, arctic sun. b. 11/2008: restenosis s/p repeat stenting. c. 01/2011: abnormal stress test but cath reportedly OK.  Marland Kitchen Hypothyroid   . Myocardial infarction [facility]) 2010   stents and cardiac rehab     Past Surgical History:  Procedure Laterality Date  . ANGIOPLASTY  2010   2 stents (femoral)   . TUBAL LIGATION      Social History Social History   Tobacco Use  . Smoking status: Former Smoker    Packs/day: 1.50    Types: Cigarettes    Start date: 03/22/1973  Quit date: 04/27/2008    Years since quitting: 11.4  . Smokeless tobacco: Never Used  Substance Use Topics  . Alcohol use: No    Alcohol/week: 0.0 standard drinks  . Drug use: Not on file    Family History No family history of bleeding/clotting disorders, porphyria or autoimmune disease   Allergies  Allergen Reactions  . Sulfa Antibiotics Hives     REVIEW OF SYSTEMS (Negative unless checked)  Constitutional: [] Weight loss  [] Fever  [] Chills Cardiac: [] Chest pain   [] Chest pressure   [] Palpitations   [] Shortness of breath when laying flat   [] Shortness of breath with  exertion. Vascular:  [x] Pain in legs with walking   [] Pain in legs at rest  [] History of DVT   [] Phlebitis   [] Swelling in legs   [] Varicose veins   [] Non-healing ulcers Pulmonary:   [] Uses home oxygen   [] Productive cough   [] Hemoptysis   [] Wheeze  [] COPD   [] Asthma Neurologic:  [] Dizziness   [] Seizures   [] History of stroke   [] History of TIA  [] Aphasia   [] Vissual changes   [] Weakness or numbness in arm   [] Weakness or numbness in leg Musculoskeletal:   [] Joint swelling   [x] Joint pain   [] Low back pain Hematologic:  [] Easy bruising  [] Easy bleeding   [] Hypercoagulable state   [] Anemic Gastrointestinal:  [] Diarrhea   [] Vomiting  [x] Gastroesophageal reflux/heartburn   [] Difficulty swallowing. Genitourinary:  [] Chronic kidney disease   [] Difficult urination  [] Frequent urination   [] Blood in urine Skin:  [] Rashes   [] Ulcers  Psychological:  [] History of anxiety   []  History of major depression.  Physical Examination  Vitals:   09/26/19 1525  BP: 129/82  Pulse: 67  Weight: 217 lb (98.4 kg)  Height: 5\' 5"  (1.651 m)   Body mass index is 36.11 kg/m. Gen: WD/WN, NAD Head: [place]/AT, No temporalis wasting.  Ear/Nose/Throat: Hearing grossly intact, nares w/o erythema or drainage, poor dentition Eyes: PER, EOMI, sclera nonicteric.  Neck: Supple, no masses.  No bruit or JVD.  Pulmonary:  Good air movement, clear to auscultation bilaterally, no use of accessory muscles.  Cardiac: RRR, normal S1, S2, no Murmurs. Vascular:  Vessel Right Left  Radial Palpable Palpable  PT Trace Palpable Not Palpable  DP Trace Palpable Not Palpable  Gastrointestinal: soft, non-distended. No guarding/no peritoneal signs.  Musculoskeletal: M/S 5/5 throughout.  No deformity or atrophy.  Neurologic: CN 2-12 intact. Pain and light touch intact in extremities.  Symmetrical.  Speech is fluent. Motor exam as listed above. Psychiatric: Judgment intact, Mood & affect appropriate for pt's clinical situation. Dermatologic:  No rashes or ulcers noted.  No changes consistent with cellulitis.   CBC Lab Results  Component Value Date   WBC 4.9 06/13/2012   HGB 12.8 06/13/2012   HCT 37.5 06/13/2012   MCV 83.0 06/13/2012   PLT 208 06/13/2012    BMET    Component Value Date/Time   NA 142 06/13/2012 0525   K 3.9 06/13/2012 0525   CL 108 06/13/2012 0525   CO2 26 06/13/2012 0525   GLUCOSE 90 06/13/2012 0525   BUN 15 06/13/2012 0525   CREATININE 0.94 06/13/2012 0525   CALCIUM 8.9 06/13/2012 0525   GFRNONAA 67 (L) 06/13/2012 0525   GFRAA 78 (L) 06/13/2012 0525   CrCl cannot be calculated (Patient's most recent lab result is older than the maximum 21 days allowed.).  COAG No results found for: INR, PROTIME  Radiology VAS [place] ABI WITH/WO TBI  Result Date: 09/26/2019  LOWER EXTREMITY DOPPLER STUDY Indications: Peripheral artery disease, and Left leg claudication.  Performing Technologist: Concha Norway RVT  Examination Guidelines: A complete evaluation includes at minimum, Doppler waveform signals and systolic blood pressure reading at the level of bilateral brachial, anterior tibial, and posterior tibial arteries, when vessel segments are accessible. Bilateral testing is considered an integral part of a complete examination. Photoelectric Plethysmograph (PPG) waveforms and toe systolic pressure readings are included as required and additional duplex testing as needed. Limited examinations for reoccurring indications may be performed as noted.  ABI Findings: +---------+------------------+-----+---------+--------+ Right    Rt Pressure (mmHg)IndexWaveform Comment  +---------+------------------+-----+---------+--------+ Brachial 141                                      +---------+------------------+-----+---------+--------+ ATA      146               1.04 triphasic         +---------+------------------+-----+---------+--------+ PTA      149               1.06 triphasic          +---------+------------------+-----+---------+--------+ Great Toe113               0.80 Normal            +---------+------------------+-----+---------+--------+ +---------+------------------+-----+----------+-------+ Left     Lt Pressure (mmHg)IndexWaveform  Comment +---------+------------------+-----+----------+-------+ Brachial 141                                      +---------+------------------+-----+----------+-------+ ATA      84                0.60 monophasic        +---------+------------------+-----+----------+-------+ PTA      88                0.62 biphasic          +---------+------------------+-----+----------+-------+ Great Toe55                0.39 Abnormal          +---------+------------------+-----+----------+-------+  Summary: Right: Resting right ankle-brachial index is within normal range. No evidence of significant right lower extremity arterial disease. The right toe-brachial index is normal. Left: Resting left ankle-brachial index indicates moderate left lower extremity arterial disease. The left toe-brachial index is abnormal.  *See table(s) above for measurements and observations.  Electronically signed by Hortencia Pilar MD on 09/26/2019 at 5:06:13 PM.   Final       Assessment/Plan 1. Atherosclerosis of native artery of both lower extremities with intermittent claudication (HCC) Recommend:  Patient should undergo arterial duplex of the lower extremity because there has been a significant deterioration in the patient's lower extremity symptoms.  The patient states they are having increased pain and a marked decrease in the distance that they can walk.  The risks and benefits as well as the alternatives were discussed in detail with the patient.  All questions were answered.  Patient agrees to proceed and understands this could be a prelude to angiography and intervention.  The patient will follow up with me in the office to review the  studies.  - VAS US AORTA/IVC/ILIACS; Future - VAS [place] LOWER EXTREMITY ARTERIAL DUPLEX; Future  2. Coronary artery disease of native artery of native heart with stable  angina pectoris ([place]) Continue cardiac and antihypertensive medications as already ordered and reviewed, no changes at this time.  Continue statin as ordered and reviewed, no changes at this time  Nitrates PRN for chest pain   3. Hyperlipidemia, mixed Continue statin as ordered and reviewed, no changes at this time   4. Gastroesophageal reflux disease without esophagitis Continue PPI as already ordered, this medication has been reviewed and there are no changes at this time.  Avoidence of caffeine and alcohol  Moderate elevation of the head of the bed     Hortencia Pilar, MD  10/05/2019 10:04 AM

## 2019-11-02 ENCOUNTER — Other Ambulatory Visit: Payer: Self-pay | Admitting: Obstetrics and Gynecology

## 2019-11-02 DIAGNOSIS — Z1231 Encounter for screening mammogram for malignant neoplasm of breast: Secondary | ICD-10-CM

## 2019-11-03 ENCOUNTER — Encounter (INDEPENDENT_AMBULATORY_CARE_PROVIDER_SITE_OTHER): Payer: 59

## 2019-11-03 ENCOUNTER — Ambulatory Visit (INDEPENDENT_AMBULATORY_CARE_PROVIDER_SITE_OTHER): Payer: 59 | Admitting: Vascular Surgery

## 2019-12-02 ENCOUNTER — Other Ambulatory Visit: Payer: Self-pay

## 2019-12-02 ENCOUNTER — Ambulatory Visit
Admission: RE | Admit: 2019-12-02 | Discharge: 2019-12-02 | Disposition: A | Discharge status: Home / Self Care | Payer: 59 | Source: Ambulatory Visit | Attending: Obstetrics and Gynecology | Admitting: Obstetrics and Gynecology

## 2019-12-02 DIAGNOSIS — Z1231 Encounter for screening mammogram for malignant neoplasm of breast: Secondary | ICD-10-CM | POA: Insufficient documentation

## 2020-01-28 ENCOUNTER — Ambulatory Visit: Admit: 2020-01-28 | Discharge status: Against Medical Advice | Payer: 59

## 2020-03-28 ENCOUNTER — Encounter: Payer: Self-pay | Admitting: Obstetrics and Gynecology

## 2020-03-28 ENCOUNTER — Other Ambulatory Visit (HOSPITAL_COMMUNITY)
Admission: RE | Admit: 2020-03-28 | Discharge: 2020-03-28 | Disposition: A | Discharge status: Home / Self Care | Payer: 59 | Source: Ambulatory Visit | Attending: Obstetrics and Gynecology | Admitting: Obstetrics and Gynecology

## 2020-03-28 ENCOUNTER — Ambulatory Visit (INDEPENDENT_AMBULATORY_CARE_PROVIDER_SITE_OTHER): Payer: 59 | Admitting: Obstetrics and Gynecology

## 2020-03-28 ENCOUNTER — Other Ambulatory Visit: Payer: Self-pay

## 2020-03-28 VITALS — BP 120/72 | Ht 65.0 in | Wt 231.5 lb

## 2020-03-28 DIAGNOSIS — Z124 Encounter for screening for malignant neoplasm of cervix: Secondary | ICD-10-CM | POA: Diagnosis not present

## 2020-03-28 DIAGNOSIS — N3946 Mixed incontinence: Secondary | ICD-10-CM | POA: Diagnosis not present

## 2020-03-28 DIAGNOSIS — Z01419 Encounter for gynecological examination (general) (routine) without abnormal findings: Secondary | ICD-10-CM | POA: Diagnosis not present

## 2020-03-28 DIAGNOSIS — Z1239 Encounter for other screening for malignant neoplasm of breast: Secondary | ICD-10-CM

## 2020-03-28 NOTE — Progress Notes (Signed)
Gynecology Annual Exam  PCP: Rusty Aus, MD  Chief Complaint:  Chief Complaint  Patient presents with  . Gynecologic Exam    Annual - feels like bladder is weaken. RM 5    History of Present Illness:Patient is a 65 y.o. No obstetric history on file. presents for annual exam. The patient has no complaints today.   LMP: No LMP recorded. Patient is postmenopausal. No PMB  The patient is sexually active. She denies dyspareunia.  The patient does perform self breast exams.  There is no notable family history of breast or ovarian cancer in her family.  The patient wears seatbelts: yes.   The patient has regular exercise: not asked.    The patient denies current symptoms of depression.     Mixed incontinence but stress predominant.  Sexually active.  Not interested in trial of pessary.  Does have IBS and occasional constipation. Interested in [place] [facility] referral   Review of Systems: Review of Systems  Constitutional: Negative for chills and fever.  HENT: Negative for congestion.   Respiratory: Negative for cough and shortness of breath.   Cardiovascular: Negative for chest pain and palpitations.  Gastrointestinal: Negative for abdominal pain, constipation, diarrhea, heartburn, nausea and vomiting.  Genitourinary: Positive for urgency. Negative for dysuria and frequency.  Skin: Negative for itching and rash.  Neurological: Negative for dizziness and headaches.  Endo/Heme/Allergies: Negative for polydipsia.  Psychiatric/Behavioral: Negative for depression.    Past Medical History:  Patient Active Problem List   Diagnosis Date Noted  . Atherosclerosis of native arteries of extremity with intermittent claudication ([place]) 10/05/2019  . GERD (gastroesophageal reflux disease) 09/26/2019  . Low serum vitamin D 08/25/2018    Formatting of this note might be different from the original. 12, 7/20   . B12 deficiency 11/21/2015    Formatting of this note might be  different from the original. Requires monthly injections   . Acquired hypothyroidism 08/09/2015  . Cardiomyopathy, ischemic 08/09/2015    Formatting of this note might be different from the original. EF 40-50%   . Hyperlipidemia, mixed 08/09/2015  . Dizziness 06/13/2012  . CAD (coronary artery disease) 06/13/2012  . H/O irritable bowel syndrome 10/20/2011    Past Surgical History:  Past Surgical History:  Procedure Laterality Date  . ANGIOPLASTY  2010   2 stents (femoral)   . TUBAL LIGATION      Gynecologic History:  No LMP recorded. Patient is postmenopausal.  Obstetric History: No obstetric history on file.  Family History:  Family History  Problem Relation Age of Onset  . Diabetes Mother   . Hypertension Mother   . Stroke Maternal Grandmother   . Diabetes Maternal Grandfather   . Stroke Maternal Grandfather     Social History:  Social History   Socioeconomic History  . Marital status: Married    Spouse name: Not on file  . Number of children: Not on file  . Years of education: Not on file  . Highest education level: Not on file  Occupational History  . Not on file  Tobacco Use  . Smoking status: Former Smoker    Packs/day: 1.50    Types: Cigarettes    Start date: 03/22/1973    Quit date: 04/27/2008    Years since quitting: 11.9  . Smokeless tobacco: Never Used  Vaping Use  . Vaping Use: Never used  Substance and Sexual Activity  . Alcohol use: No    Alcohol/week: 0.0 standard drinks  . Drug  use: Never  . Sexual activity: Yes    Birth control/protection: Post-menopausal  Other Topics Concern  . Not on file  Social History Narrative  . Not on file   Social Determinants of Health   Financial Resource Strain: Not on file  Food Insecurity: Not on file  Transportation Needs: Not on file  Physical Activity: Not on file  Stress: Not on file  Social Connections: Not on file  Intimate Partner Violence: Not on file    Allergies:  Allergies   Allergen Reactions  . Sulfa Antibiotics Hives    Medications: Prior to Admission medications   Medication Sig Start Date End Date Taking? Authorizing Provider  aspirin EC 81 MG tablet Take 81 mg by mouth daily.   Yes [provider]  Cholecalciferol 50 MCG (2000 UT) TABS Take by mouth.   Yes [provider]  citalopram (CELEXA) 10 MG tablet Take 10 mg by mouth daily.   Yes [provider]  cyanocobalamin (,VITAMIN B-12,) 1000 MCG/ML injection INJECT 1.5 MLS INTO THE MUSCLE ONCE MONTHLY 10/17/15  Yes [provider]  levothyroxine (SYNTHROID, LEVOTHROID) 25 MCG tablet Take 25 mcg by mouth daily before breakfast.   Yes [provider]  metoprolol succinate (TOPROL-XL) 25 MG 24 hr tablet Take 25 mg by mouth daily.   Yes [provider]  pantoprazole (PROTONIX) 40 MG tablet Take 40 mg by mouth daily.   Yes [provider]  ramipril (ALTACE) 5 MG capsule Take 5 mg by mouth daily.   Yes [provider]  rosuvastatin (CRESTOR) 40 MG tablet Take 40 mg by mouth daily.   Yes [provider]  fluticasone (FLONASE) 50 MCG/ACT nasal spray Place 2 sprays into the nose daily. Patient not taking: Reported on 03/28/2020    [provider]  furosemide (LASIX) 20 MG tablet Take by mouth. 02/24/19 02/24/20  [provider]  ibuprofen (ADVIL) 600 MG tablet Take 1 tablet (600 mg total) by mouth every 8 (eight) hours as needed. Patient not taking: Reported on 09/26/2019 10/17/18   Jearld Fenton, NP  nitroGLYCERIN (NITROSTAT) 0.4 MG SL tablet Place 0.4 mg under the tongue every 5 (five) minutes as needed for chest pain. Patient not taking: Reported on 09/26/2019    [provider]  potassium chloride (MICRO-K) 10 MEQ CR capsule Take two tabs by mouth with every dose of as needed lasix (furosemide). 02/25/19 02/25/20  [provider]    Physical Exam Vitals: Blood pressure 120/72, height 5\' 5"  (1.651 m),  weight 231 lb 8 oz (105 kg).  General: NAD HEENT: normocephalic, anicteric Thyroid: no enlargement, no palpable nodules Pulmonary: No increased work of breathing, CTAB Cardiovascular: RRR, distal pulses 2+ Breast: Breast symmetrical, no tenderness, no palpable nodules or masses, no skin or nipple retraction present, no nipple discharge.  No axillary or supraclavicular lymphadenopathy. Abdomen: NABS, soft, non-tender, non-distended.  Umbilicus without lesions.  No hepatomegaly, splenomegaly or masses palpable. No evidence of hernia  Genitourinary:  External: Normal external female genitalia.  Normal urethral meatus, normal Bartholin's and Skene's glands.    Vagina: Normal vaginal mucosa, no evidence of prolapse.    Cervix: Grossly normal in appearance, no bleeding  Uterus: Non-enlarged, mobile, normal contour.  No CMT  Adnexa: ovaries non-enlarged, no adnexal masses  Rectal: deferred  Lymphatic: no evidence of inguinal lymphadenopathy Extremities: no edema, erythema, or tenderness Neurologic: Grossly intact Psychiatric: mood appropriate, affect full  Female chaperone present for pelvic and breast  portions of the  physical exam   There is no immunization history on file for this patient.    Assessment: 65 y.o. No obstetric history on file. routine annual exam  Plan: Problem List Items Addressed This Visit   None   Visit Diagnoses    Encounter for gynecological examination without abnormal finding    -  Primary   Breast screening       Relevant Orders   MM 3D SCREEN BREAST BILATERAL   Screening for malignant neoplasm of cervix       Relevant Orders   Cytology - PAP   Mixed incontinence urge and stress       Relevant Orders   Ambulatory referral to Urogynecology      1) Mammogram - recommend yearly screening mammogram.  Mammogram Was ordered today  2) STI screening  was notoffered and therefore not obtained  3) ASCCP guidelines and rational discussed.  Patient opts for  every 3 years screening interval  4) Osteoporosis  - per USPTF routine screening DEXA at age 63  Consider FDA-approved medical therapies in postmenopausal women and men aged 57 years and older, based on the following: a) A hip or vertebral (clinical or morphometric) fracture b) T-score ? -2.5 at the femoral neck or spine after appropriate evaluation to exclude secondary causes C) Low bone mass (T-score between -1.0 and -2.5 at the femoral neck or spine) and a 10-year probability of a hip fracture ? 3% or a 10-year probability of a major osteoporosis-related fracture ? 20% based on the [place]-adapted WHO algorithm   5) Routine healthcare maintenance including cholesterol, diabetes screening discussed managed by PCP  6) Return in about 1 year (around 03/28/2021) for annual.    Malachy Mood, MD Mosetta Pigeon, [place] 03/28/2020, 8:44 AM

## 2020-03-28 NOTE — Patient Instructions (Signed)
[facility] [address] [place] [address]  [facility] [place]  [address] [place] [address]  Phone: [phone]  

## 2020-03-30 LAB — CYTOLOGY - PAP
Comment: NEGATIVE
Diagnosis: NEGATIVE
High risk HPV: NEGATIVE

## 2020-04-18 ENCOUNTER — Inpatient Hospital Stay (HOSPITAL_COMMUNITY): Payer: 59

## 2020-04-18 ENCOUNTER — Emergency Department
Admission: EM | Admit: 2020-04-18 | Discharge: 2020-04-18 | Disposition: A | Discharge status: Other Acute Inpatient Hospital | Payer: 59 | Attending: Emergency Medicine | Admitting: Emergency Medicine

## 2020-04-18 ENCOUNTER — Encounter (HOSPITAL_COMMUNITY)
Admission: EM | Disposition: A | Discharge status: Home / Self Care | Payer: Self-pay | Source: Other Acute Inpatient Hospital | Attending: Neurology

## 2020-04-18 ENCOUNTER — Emergency Department: Payer: 59

## 2020-04-18 ENCOUNTER — Inpatient Hospital Stay (HOSPITAL_COMMUNITY): Payer: 59 | Admitting: Anesthesiology

## 2020-04-18 ENCOUNTER — Other Ambulatory Visit: Payer: Self-pay

## 2020-04-18 ENCOUNTER — Inpatient Hospital Stay (HOSPITAL_COMMUNITY)
Admission: EM | Admit: 2020-04-18 | Discharge: 2020-04-23 | DRG: 041 | Disposition: A | Discharge status: Home / Self Care | Payer: 59 | Source: Other Acute Inpatient Hospital | Attending: Neurology | Admitting: Neurology

## 2020-04-18 DIAGNOSIS — F419 Anxiety disorder, unspecified: Secondary | ICD-10-CM | POA: Diagnosis present

## 2020-04-18 DIAGNOSIS — I2510 Atherosclerotic heart disease of native coronary artery without angina pectoris: Secondary | ICD-10-CM | POA: Diagnosis present

## 2020-04-18 DIAGNOSIS — R29710 NIHSS score 10: Secondary | ICD-10-CM | POA: Diagnosis not present

## 2020-04-18 DIAGNOSIS — Z7982 Long term (current) use of aspirin: Secondary | ICD-10-CM

## 2020-04-18 DIAGNOSIS — R29706 NIHSS score 6: Secondary | ICD-10-CM | POA: Diagnosis present

## 2020-04-18 DIAGNOSIS — Z9282 Status post administration of tPA (rtPA) in a different facility within the last 24 hours prior to admission to current facility: Secondary | ICD-10-CM | POA: Diagnosis not present

## 2020-04-18 DIAGNOSIS — I739 Peripheral vascular disease, unspecified: Secondary | ICD-10-CM | POA: Diagnosis present

## 2020-04-18 DIAGNOSIS — I63512 Cerebral infarction due to unspecified occlusion or stenosis of left middle cerebral artery: Secondary | ICD-10-CM

## 2020-04-18 DIAGNOSIS — E669 Obesity, unspecified: Secondary | ICD-10-CM | POA: Diagnosis present

## 2020-04-18 DIAGNOSIS — R4781 Slurred speech: Secondary | ICD-10-CM | POA: Diagnosis present

## 2020-04-18 DIAGNOSIS — I69390 Apraxia following cerebral infarction: Secondary | ICD-10-CM | POA: Diagnosis not present

## 2020-04-18 DIAGNOSIS — I6602 Occlusion and stenosis of left middle cerebral artery: Secondary | ICD-10-CM | POA: Diagnosis present

## 2020-04-18 DIAGNOSIS — Z8674 Personal history of sudden cardiac arrest: Secondary | ICD-10-CM | POA: Diagnosis not present

## 2020-04-18 DIAGNOSIS — R4701 Aphasia: Secondary | ICD-10-CM | POA: Diagnosis present

## 2020-04-18 DIAGNOSIS — R29707 NIHSS score 7: Secondary | ICD-10-CM | POA: Diagnosis not present

## 2020-04-18 DIAGNOSIS — E039 Hypothyroidism, unspecified: Secondary | ICD-10-CM | POA: Diagnosis present

## 2020-04-18 DIAGNOSIS — Z87891 Personal history of nicotine dependence: Secondary | ICD-10-CM | POA: Diagnosis not present

## 2020-04-18 DIAGNOSIS — I639 Cerebral infarction, unspecified: Secondary | ICD-10-CM | POA: Diagnosis present

## 2020-04-18 DIAGNOSIS — I10 Essential (primary) hypertension: Secondary | ICD-10-CM | POA: Diagnosis present

## 2020-04-18 DIAGNOSIS — Z20822 Contact with and (suspected) exposure to covid-19: Secondary | ICD-10-CM | POA: Insufficient documentation

## 2020-04-18 DIAGNOSIS — Z8249 Family history of ischemic heart disease and other diseases of the circulatory system: Secondary | ICD-10-CM

## 2020-04-18 DIAGNOSIS — E785 Hyperlipidemia, unspecified: Secondary | ICD-10-CM | POA: Diagnosis present

## 2020-04-18 DIAGNOSIS — Z882 Allergy status to sulfonamides status: Secondary | ICD-10-CM

## 2020-04-18 DIAGNOSIS — Z823 Family history of stroke: Secondary | ICD-10-CM

## 2020-04-18 DIAGNOSIS — Z79899 Other long term (current) drug therapy: Secondary | ICD-10-CM

## 2020-04-18 DIAGNOSIS — K219 Gastro-esophageal reflux disease without esophagitis: Secondary | ICD-10-CM | POA: Diagnosis present

## 2020-04-18 DIAGNOSIS — Z79890 Hormone replacement therapy (postmenopausal): Secondary | ICD-10-CM

## 2020-04-18 DIAGNOSIS — R29705 NIHSS score 5: Secondary | ICD-10-CM | POA: Diagnosis not present

## 2020-04-18 DIAGNOSIS — Q211 Atrial septal defect: Secondary | ICD-10-CM

## 2020-04-18 DIAGNOSIS — R410 Disorientation, unspecified: Secondary | ICD-10-CM | POA: Diagnosis present

## 2020-04-18 DIAGNOSIS — I25119 Atherosclerotic heart disease of native coronary artery with unspecified angina pectoris: Secondary | ICD-10-CM | POA: Diagnosis not present

## 2020-04-18 DIAGNOSIS — H9192 Unspecified hearing loss, left ear: Secondary | ICD-10-CM | POA: Diagnosis present

## 2020-04-18 DIAGNOSIS — I63312 Cerebral infarction due to thrombosis of left middle cerebral artery: Secondary | ICD-10-CM | POA: Diagnosis not present

## 2020-04-18 DIAGNOSIS — I6389 Other cerebral infarction: Secondary | ICD-10-CM

## 2020-04-18 DIAGNOSIS — I255 Ischemic cardiomyopathy: Secondary | ICD-10-CM | POA: Diagnosis present

## 2020-04-18 DIAGNOSIS — Z6834 Body mass index (BMI) 34.0-34.9, adult: Secondary | ICD-10-CM

## 2020-04-18 DIAGNOSIS — G8191 Hemiplegia, unspecified affecting right dominant side: Secondary | ICD-10-CM | POA: Diagnosis present

## 2020-04-18 DIAGNOSIS — I252 Old myocardial infarction: Secondary | ICD-10-CM | POA: Diagnosis not present

## 2020-04-18 DIAGNOSIS — I63412 Cerebral infarction due to embolism of left middle cerebral artery: Principal | ICD-10-CM | POA: Diagnosis present

## 2020-04-18 DIAGNOSIS — Z955 Presence of coronary angioplasty implant and graft: Secondary | ICD-10-CM

## 2020-04-18 DIAGNOSIS — F410 Panic disorder [episodic paroxysmal anxiety] without agoraphobia: Secondary | ICD-10-CM | POA: Diagnosis present

## 2020-04-18 DIAGNOSIS — R414 Neurologic neglect syndrome: Secondary | ICD-10-CM | POA: Diagnosis present

## 2020-04-18 HISTORY — PX: IR PERCUTANEOUS ART THROMBECTOMY/INFUSION INTRACRANIAL INC DIAG ANGIO: IMG6087

## 2020-04-18 HISTORY — PX: RADIOLOGY WITH ANESTHESIA: SHX6223

## 2020-04-18 HISTORY — PX: IR CT HEAD LTD: IMG2386

## 2020-04-18 LAB — CBC WITH DIFFERENTIAL/PLATELET
Abs Immature Granulocytes: 0.01 10*3/uL (ref 0.00–0.07)
Basophils Absolute: 0 10*3/uL (ref 0.0–0.1)
Basophils Relative: 1 %
Eosinophils Absolute: 0.1 10*3/uL (ref 0.0–0.5)
Eosinophils Relative: 2 %
HCT: 36.6 % (ref 36.0–46.0)
Hemoglobin: 11.9 g/dL — ABNORMAL LOW (ref 12.0–15.0)
Immature Granulocytes: 0 %
Lymphocytes Relative: 32 %
Lymphs Abs: 1.3 10*3/uL (ref 0.7–4.0)
MCH: 28.1 pg (ref 26.0–34.0)
MCHC: 32.5 g/dL (ref 30.0–36.0)
MCV: 86.5 fL (ref 80.0–100.0)
Monocytes Absolute: 0.4 10*3/uL (ref 0.1–1.0)
Monocytes Relative: 9 %
Neutro Abs: 2.4 10*3/uL (ref 1.7–7.7)
Neutrophils Relative %: 56 %
Platelets: 195 10*3/uL (ref 150–400)
RBC: 4.23 MIL/uL (ref 3.87–5.11)
RDW: 14.8 % (ref 11.5–15.5)
WBC: 4.2 10*3/uL (ref 4.0–10.5)
nRBC: 0 % (ref 0.0–0.2)

## 2020-04-18 LAB — MRSA PCR SCREENING: MRSA by PCR: NEGATIVE

## 2020-04-18 LAB — URINALYSIS, ROUTINE W REFLEX MICROSCOPIC
Bilirubin Urine: NEGATIVE
Glucose, UA: NEGATIVE mg/dL
Hgb urine dipstick: NEGATIVE
Ketones, ur: NEGATIVE mg/dL
Leukocytes,Ua: NEGATIVE
Nitrite: NEGATIVE
Protein, ur: NEGATIVE mg/dL
Specific Gravity, Urine: 1.017 (ref 1.005–1.030)
pH: 7 (ref 5.0–8.0)

## 2020-04-18 LAB — ECHOCARDIOGRAM COMPLETE
AR max vel: 1.91 cm2
AV Area VTI: 1.8 cm2
AV Area mean vel: 1.93 cm2
AV Mean grad: 5 mmHg
AV Peak grad: 9.7 mmHg
Ao pk vel: 1.56 m/s
Height: 65 in
S' Lateral: 3.1 cm
Weight: 3473.6 oz

## 2020-04-18 LAB — URINE DRUG SCREEN, QUALITATIVE (ARMC ONLY)
Amphetamines, Ur Screen: NOT DETECTED
Barbiturates, Ur Screen: NOT DETECTED
Benzodiazepine, Ur Scrn: NOT DETECTED
Cannabinoid 50 Ng, Ur ~~LOC~~: NOT DETECTED
Cocaine Metabolite,Ur ~~LOC~~: NOT DETECTED
MDMA (Ecstasy)Ur Screen: NOT DETECTED
Methadone Scn, Ur: NOT DETECTED
Opiate, Ur Screen: NOT DETECTED
Phencyclidine (PCP) Ur S: NOT DETECTED
Tricyclic, Ur Screen: NOT DETECTED

## 2020-04-18 LAB — COMPREHENSIVE METABOLIC PANEL
ALT: 19 U/L (ref 0–44)
AST: 18 U/L (ref 15–41)
Albumin: 3.4 g/dL — ABNORMAL LOW (ref 3.5–5.0)
Alkaline Phosphatase: 46 U/L (ref 38–126)
Anion gap: 6 (ref 5–15)
BUN: 18 mg/dL (ref 8–23)
CO2: 26 mmol/L (ref 22–32)
Calcium: 8.5 mg/dL — ABNORMAL LOW (ref 8.9–10.3)
Chloride: 103 mmol/L (ref 98–111)
Creatinine, Ser: 0.91 mg/dL (ref 0.44–1.00)
GFR, Estimated: >60 mL/min (ref >60)
Glucose, Bld: 111 mg/dL — ABNORMAL HIGH (ref 70–99)
Potassium: 4 mmol/L (ref 3.5–5.1)
Sodium: 135 mmol/L (ref 135–145)
Total Bilirubin: 0.5 mg/dL (ref 0.3–1.2)
Total Protein: 6.3 g/dL — ABNORMAL LOW (ref 6.5–8.1)

## 2020-04-18 LAB — APTT: aPTT: 29 seconds (ref 24–36)

## 2020-04-18 LAB — RESP PANEL BY RT-PCR (FLU A&B, COVID) ARPGX2
Influenza A by PCR: NEGATIVE
Influenza B by PCR: NEGATIVE
SARS Coronavirus 2 by RT PCR: NEGATIVE

## 2020-04-18 LAB — CBG MONITORING, ED: Glucose-Capillary: 103 mg/dL — ABNORMAL HIGH (ref 70–99)

## 2020-04-18 LAB — PROTIME-INR
INR: 0.9 (ref 0.8–1.2)
Prothrombin Time: 12.2 seconds (ref 11.4–15.2)

## 2020-04-18 LAB — ETHANOL: Alcohol, Ethyl (B): <10 mg/dL (ref <10)

## 2020-04-18 SURGERY — IR WITH ANESTHESIA
Anesthesia: General

## 2020-04-18 MED ORDER — FENTANYL CITRATE (PF) 100 MCG/2ML IJ SOLN
INTRAMUSCULAR | Status: DC | PRN
  Administered 2020-04-18: 50 ug via INTRAVENOUS

## 2020-04-18 MED ORDER — IOHEXOL 300 MG/ML  SOLN: 150.0000 mL | Freq: Once | INTRAMUSCULAR | Status: DC | PRN

## 2020-04-18 MED ORDER — CLEVIDIPINE BUTYRATE 0.5 MG/ML IV EMUL
0.0000 mg/h | INTRAVENOUS | Status: DC
  Administered 2020-04-18 (×2): 2 mg/h via INTRAVENOUS
  Administered 2020-04-19: 4 mg/h via INTRAVENOUS
  Filled 2020-04-18 (×3): qty 50

## 2020-04-18 MED ORDER — SENNOSIDES-DOCUSATE SODIUM 8.6-50 MG PO TABS
1.0000 | ORAL_TABLET | Freq: Two times a day (BID) | ORAL | Status: DC
  Administered 2020-04-19 – 2020-04-22 (×5): 1 via ORAL
  Filled 2020-04-18 (×6): qty 1

## 2020-04-18 MED ORDER — NITROGLYCERIN 1 MG/10 ML FOR IR/CATH LAB
INTRA_ARTERIAL | Status: AC
  Filled 2020-04-18: qty 10

## 2020-04-18 MED ORDER — ALTEPLASE (STROKE) FULL DOSE INFUSION
0.9000 mg/kg | Freq: Once | INTRAVENOUS | Status: AC
  Administered 2020-04-18: 88.7 mg via INTRAVENOUS
  Filled 2020-04-18: qty 100

## 2020-04-18 MED ORDER — IOHEXOL 350 MG/ML SOLN
100.0000 mL | Freq: Once | INTRAVENOUS | Status: AC | PRN
  Administered 2020-04-18: 100 mL via INTRAVENOUS

## 2020-04-18 MED ORDER — ACETAMINOPHEN 650 MG RE SUPP: 650.0000 mg | RECTAL | Status: DC | PRN

## 2020-04-18 MED ORDER — STROKE: EARLY STAGES OF RECOVERY BOOK
Freq: Once | Status: DC
  Filled 2020-04-18 (×2): qty 1

## 2020-04-18 MED ORDER — LABETALOL HCL 5 MG/ML IV SOLN: 10.0000 mg | INTRAVENOUS | Status: DC | PRN

## 2020-04-18 MED ORDER — PHENYLEPHRINE HCL-NACL 10-0.9 MG/250ML-% IV SOLN
INTRAVENOUS | Status: DC | PRN
  Administered 2020-04-18 (×2): 20 ug/min via INTRAVENOUS

## 2020-04-18 MED ORDER — ACETAMINOPHEN 160 MG/5ML PO SOLN: 650.0000 mg | ORAL | Status: DC | PRN

## 2020-04-18 MED ORDER — CHLORHEXIDINE GLUCONATE 0.12 % MT SOLN
15.0000 mL | Freq: Two times a day (BID) | OROMUCOSAL | Status: DC
  Administered 2020-04-19 – 2020-04-23 (×7): 15 mL via OROMUCOSAL
  Filled 2020-04-18 (×6): qty 15

## 2020-04-18 MED ORDER — SODIUM CHLORIDE 0.9 % IV SOLN: INTRAVENOUS | Status: DC

## 2020-04-18 MED ORDER — ROSUVASTATIN CALCIUM 20 MG PO TABS
40.0000 mg | ORAL_TABLET | Freq: Every day | ORAL | Status: DC
  Administered 2020-04-19 – 2020-04-23 (×5): 40 mg via ORAL
  Filled 2020-04-18 (×6): qty 2

## 2020-04-18 MED ORDER — VERAPAMIL HCL 2.5 MG/ML IV SOLN
INTRAVENOUS | Status: AC | PRN
Start: 2020-04-18 — End: 2020-04-18
  Administered 2020-04-18: 2.5 mg via INTRAVENOUS

## 2020-04-18 MED ORDER — SUGAMMADEX SODIUM 200 MG/2ML IV SOLN
INTRAVENOUS | Status: DC | PRN
  Administered 2020-04-18: 200 mg via INTRAVENOUS

## 2020-04-18 MED ORDER — ROCURONIUM BROMIDE 10 MG/ML (PF) SYRINGE
PREFILLED_SYRINGE | INTRAVENOUS | Status: DC | PRN
  Administered 2020-04-18: 50 mg via INTRAVENOUS

## 2020-04-18 MED ORDER — CEFAZOLIN SODIUM-DEXTROSE 2-3 GM-%(50ML) IV SOLR
INTRAVENOUS | Status: DC | PRN
  Administered 2020-04-18: 2 g via INTRAVENOUS

## 2020-04-18 MED ORDER — PANTOPRAZOLE SODIUM 40 MG IV SOLR
40.0000 mg | Freq: Every day | INTRAVENOUS | Status: DC
  Administered 2020-04-18: 40 mg via INTRAVENOUS
  Filled 2020-04-18: qty 40

## 2020-04-18 MED ORDER — SODIUM CHLORIDE 0.9 % IV SOLN: 50.0000 mL | Freq: Once | INTRAVENOUS | Status: DC

## 2020-04-18 MED ORDER — CHLORHEXIDINE GLUCONATE CLOTH 2 % EX PADS
6.0000 | MEDICATED_PAD | Freq: Every day | CUTANEOUS | Status: DC
  Administered 2020-04-18 – 2020-04-19 (×2): 6 via TOPICAL

## 2020-04-18 MED ORDER — DEXAMETHASONE SODIUM PHOSPHATE 4 MG/ML IJ SOLN
INTRAMUSCULAR | Status: DC | PRN
  Administered 2020-04-18: 8 mg via INTRAVENOUS

## 2020-04-18 MED ORDER — ACETAMINOPHEN 325 MG PO TABS: 650.0000 mg | ORAL_TABLET | ORAL | Status: DC | PRN

## 2020-04-18 MED ORDER — LIDOCAINE 2% (20 MG/ML) 5 ML SYRINGE
INTRAMUSCULAR | Status: DC | PRN
  Administered 2020-04-18: 100 mg via INTRAVENOUS

## 2020-04-18 MED ORDER — SUCCINYLCHOLINE CHLORIDE 20 MG/ML IJ SOLN
INTRAMUSCULAR | Status: DC | PRN
  Administered 2020-04-18: 100 mg via INTRAVENOUS

## 2020-04-18 MED ORDER — ORAL CARE MOUTH RINSE
15.0000 mL | Freq: Two times a day (BID) | OROMUCOSAL | Status: DC
  Administered 2020-04-19: 15 mL via OROMUCOSAL

## 2020-04-18 MED ORDER — ONDANSETRON HCL 4 MG/2ML IJ SOLN
INTRAMUSCULAR | Status: DC | PRN
  Administered 2020-04-18: 4 mg via INTRAVENOUS

## 2020-04-18 MED ORDER — SODIUM CHLORIDE (PF) 0.9 % IJ SOLN
INTRAVENOUS | Status: AC | PRN
  Administered 2020-04-18 (×3): 25 ug via INTRA_ARTERIAL

## 2020-04-18 MED ORDER — VERAPAMIL HCL 2.5 MG/ML IV SOLN
INTRAVENOUS | Status: AC
  Filled 2020-04-18: qty 2

## 2020-04-18 MED ORDER — LEVOTHYROXINE SODIUM 75 MCG PO TABS
75.0000 ug | ORAL_TABLET | Freq: Every day | ORAL | Status: DC
  Administered 2020-04-19 – 2020-04-23 (×5): 75 ug via ORAL
  Filled 2020-04-18 (×5): qty 1

## 2020-04-18 MED ORDER — CITALOPRAM HYDROBROMIDE 20 MG PO TABS
20.0000 mg | ORAL_TABLET | Freq: Every day | ORAL | Status: DC
  Administered 2020-04-19 – 2020-04-23 (×5): 20 mg via ORAL
  Filled 2020-04-18: qty 2
  Filled 2020-04-18 (×4): qty 1

## 2020-04-18 MED ORDER — METOPROLOL SUCCINATE ER 25 MG PO TB24
25.0000 mg | ORAL_TABLET | Freq: Every day | ORAL | Status: DC
  Administered 2020-04-19: 25 mg via ORAL
  Filled 2020-04-18: qty 1

## 2020-04-18 MED ORDER — IOHEXOL 300 MG/ML  SOLN: 50.0000 mL | Freq: Once | INTRAMUSCULAR | Status: DC | PRN

## 2020-04-18 MED ORDER — RAMIPRIL 5 MG PO CAPS
5.0000 mg | ORAL_CAPSULE | Freq: Every day | ORAL | Status: DC
  Administered 2020-04-19: 5 mg via ORAL
  Filled 2020-04-18: qty 1

## 2020-04-18 MED ORDER — CYANOCOBALAMIN 1000 MCG/ML IJ SOLN
1000.0000 ug | INTRAMUSCULAR | Status: DC
  Administered 2020-04-20: 1000 ug via INTRAMUSCULAR
  Filled 2020-04-18: qty 1

## 2020-04-18 MED ORDER — PROPOFOL 10 MG/ML IV BOLUS
INTRAVENOUS | Status: DC | PRN
  Administered 2020-04-18: 120 mg via INTRAVENOUS

## 2020-04-18 MED ORDER — CEFAZOLIN SODIUM-DEXTROSE 2-4 GM/100ML-% IV SOLN
INTRAVENOUS | Status: AC
  Filled 2020-04-18: qty 100

## 2020-04-18 MED ORDER — ACETAMINOPHEN 325 MG PO TABS
650.0000 mg | ORAL_TABLET | ORAL | Status: DC | PRN
  Administered 2020-04-19 – 2020-04-23 (×10): 650 mg via ORAL
  Filled 2020-04-18 (×10): qty 2

## 2020-04-18 MED ORDER — LACTATED RINGERS IV SOLN: INTRAVENOUS | Status: DC | PRN

## 2020-04-18 NOTE — Sedation Documentation (Signed)
Quick clot applied to right groin.

## 2020-04-18 NOTE — Anesthesia Procedure Notes (Addendum)
Arterial Line Insertion Start/End3/23/2022 1:30 PM, 04/18/2020 1:33 PM Performed by: CRNA  Patient location: OOR procedure area. Preanesthetic checklist: patient identified, IV checked, site marked, risks and benefits discussed, surgical consent, monitors and equipment checked, pre-op evaluation, timeout performed and anesthesia consent Emergency situation Left, radial was placed Catheter size: 20 G Hand hygiene performed , maximum sterile barriers used  and Seldinger technique used Allen's test indicative of satisfactory collateral circulation Attempts: 1 Procedure performed without using ultrasound guided technique. Following insertion, dressing applied and Biopatch. Post procedure assessment: normal  Patient tolerated the procedure well with no immediate complications.

## 2020-04-18 NOTE — Transfer of Care (Signed)
Immediate Anesthesia Transfer of Care Note  Patient: Tracey [name]  Procedure(s) Performed: IR WITH ANESTHESIA (N/A )  Patient Location: ICU  Anesthesia Type:General  Level of Consciousness: awake, alert  and oriented  Airway & Oxygen Therapy: Patient Spontanous Breathing and Patient connected to nasal cannula oxygen  Post-op Assessment: Report given to RN, Post -op Vital signs reviewed and stable and Patient moving all extremities X 4  Post vital signs: Reviewed and stable  Last Vitals:  Vitals Value Taken Time  BP 135/98 04/18/20 1502  Temp    Pulse 89 04/18/20 1516  Resp 22 04/18/20 1516  SpO2 99 % 04/18/20 1516  Vitals shown include unvalidated device data.  Last Pain: There were no vitals filed for this visit.       Complications: No complications documented.

## 2020-04-18 NOTE — H&P (Signed)
Neurology consult H&P  CC: M2 occlusion found at [facility]  History is obtained from: chart, EMS  HPI: Tracey [name] is a 65 y.o. female with a PMHx of CAD s/p MI and cardiac arrest with stentins 04/2008, restenting 11/2018, claudication, HTN, hypothryoidism, HLD, and anxiety. There is one documentation on old chart that denotes history of DM, but patient is not on medications for DM and NP can not locate last HbA1c.  Patient presented as a code stroke to [facility] ED after coworkers noticed her speech was slurred and she was having difficulty finding her words. LKW 0915. CTA head and neck at [facility] showed a distal M2 occlusion in left MCA branch. CT perfusion showed 16cc in penumbra and a mismatch of 16 as well. The patient was given tPA, however, when the tPA was close to being finished, staff thought patient's speech was more slurred. tPA was stopped and patient was taken for emergent [facility] which showed no intracranial hemorrhage. Code IR was called and patient was tranferred to [facility] and arrived at 1300 hours. BP was 130s in field. CBG 103.   A brief exam was performed on the ED bridge, then patient taken emergently to IR. NIHSS score was 2.   Dr. Erlinda Hong was present at the bridge.   LKW: 0915 hours tpa given?: Yes, at OSH.  IR Thrombectomy? Yes, in IR at time of note.  MRS 0  NIHSS:  1a Level of Conscious: 0 1b LOC Questions: 0 1c LOC Commands: 0 2 Best Gaze: 0 4 Facial Palsy: 0 5a Motor Arm - left: 0  5b Motor Arm - Right: 0 6a Motor Leg - Left: 0 6b Motor Leg - Right: 0 7 Limb Ataxia: 1 8 Sensory: 0 9 Best Language: 1 10 Dysarthria: 1 11 Extinction and Inattention: 0 TOTAL: 3  ROS: Unable to perform complete ROS secondary to emergent nature of event.   Past Medical History:  Diagnosis Date  . Anxiety   . Bronchitis   . CAD (coronary artery disease)    a. 04/2008: cariac arrest with prox LAD occ s/p stenting, arctic sun. b. 11/2008: restenosis s/p repeat stenting. c. 01/2011:  abnormal stress test but cath reportedly OK.  Marland Kitchen Hypothyroid   . Myocardial infarction [facility]) 2010   stents and cardiac rehab   PAD, remote history of tobacco abuse GERD B12 deficiency HLD   Family History  Problem Relation Age of Onset  . Diabetes Mother   . Hypertension Mother   . Stroke Maternal Grandmother   . Diabetes Maternal Grandfather   . Stroke Maternal Grandfather     Social History:  reports that she quit smoking about 11 years ago. Her smoking use included cigarettes. She started smoking about 47 years ago. She smoked 1.50 packs per day. She has never used smokeless tobacco. She reports that she does not drink alcohol and does not use drugs.   Prior to Admission medications   Medication Sig Start Date End Date Taking? Authorizing Provider  aspirin EC 81 MG tablet Take 81 mg by mouth daily.    [provider]  Cholecalciferol 50 MCG (2000 UT) TABS Take by mouth.    [provider]  citalopram (CELEXA) 10 MG tablet Take 10 mg by mouth daily.    [provider]  citalopram (CELEXA) 20 MG tablet Take 20 mg by mouth daily. 04/17/20   [provider]  cyanocobalamin (,VITAMIN B-12,) 1000 MCG/ML injection INJECT 1.5 MLS INTO THE MUSCLE ONCE MONTHLY 10/17/15  [provider]  fluticasone (FLONASE) 50 MCG/ACT nasal spray Place 2 sprays into the nose daily. Patient not taking: Reported on 03/28/2020    [provider]  furosemide (LASIX) 20 MG tablet Take by mouth. 02/24/19 02/24/20  [provider]  ibuprofen (ADVIL) 600 MG tablet Take 1 tablet (600 mg total) by mouth every 8 (eight) hours as needed. Patient not taking: Reported on 09/26/2019 10/17/18   Jearld Fenton, NP  levothyroxine (SYNTHROID) 75 MCG tablet Take 75 mcg by mouth daily. 04/17/20   [provider]  levothyroxine (SYNTHROID, LEVOTHROID) 25 MCG tablet Take 25 mcg by mouth daily before breakfast.    [provider]  metoprolol succinate  (TOPROL-XL) 25 MG 24 hr tablet Take 25 mg by mouth daily.    [provider]  nitroGLYCERIN (NITROSTAT) 0.4 MG SL tablet Place 0.4 mg under the tongue every 5 (five) minutes as needed for chest pain. Patient not taking: Reported on 09/26/2019    [provider]  pantoprazole (PROTONIX) 40 MG tablet Take 40 mg by mouth daily.    [provider]  potassium chloride (MICRO-K) 10 MEQ CR capsule Take two tabs by mouth with every dose of as needed lasix (furosemide). 02/25/19 02/25/20  [provider]  ramipril (ALTACE) 5 MG capsule Take 5 mg by mouth daily.    [provider]  rosuvastatin (CRESTOR) 40 MG tablet Take 40 mg by mouth daily.    [provider]    Exam: Current vital signs: BP 130/97, HR 93, RR 18, SaO2 100% on RA  Physical Exam  Constitutional: Appears well-developed and well-nourished. Obese.  Psych: Affect appropriate to situation. Anxious.  Eyes: No scleral injection HENT: No OP obstrucion Head: Normocephalic.  Cardiovascular: Normal rate and regular rhythm.  Respiratory: Effort normal  GI: Soft.  No distension. There is no tenderness.  Skin: WDI. Well perfused.   Neuro: Mental Status: Patient is awake, alert, oriented to person, place, month, year, and situation. Patient is able to give a clear and coherent history. No signs of mild expressive aphasia.  Speech/Language: Mild dysarthria. Naming, repetition, fluency, and comprehension intact.  Cranial Nerves: II: Visual Fields are full. Pupils are equal, round, and reactive to light.  III,IV, VI: EOMI without ptosis or diplopia.  V: Facial sensation is symmetric to light touch.  VII: Facial movement is symmetric.  VIII: hearing is intact to voice XI: Shoulder shrug is symmetric. XII: tongue is midline without atrophy or fasciculations.  Motor: Tone is normal. Bulk is obese. 5/5 strength was present in all four extremities.  Sensory: Sensation is symmetric to light  touch in the arms and legs.  Deep Tendon Reflexes: 2+ and symmetric in the biceps a nd patellae.  Plantars: Toes are downgoing bilaterally.  Cerebellar: FNF ataxic on left.   I have reviewed labs in epic and the pertinent results are: INR 0.9    MD reviewed the images obtained:  NCT head  No evidence of acute intracranial abnormality.  ASPECTS is 10. Mild cerebral white matter chronic small vessel ischemic disease. Right sphenoid sinusitis.  R/p CTH at [facility] s/p tPA No evidence of acute intracranial hemorrhage status post tPA. No demarcated cortical infarct.  CTA head and neck 1. Occluded distal M2 left MCA branch vessel. 2. Mild non-stenotic calcified plaque within the intracranial ICAs bilaterally.  CT perfusion head: The perfusion software identifies a 16 mL region of hypoperfusion within the posterior left MCA territory utilizing the Tmax>6 seconds threshold. The perfusion  software identifies no core infarct. Mismatch volume: 16 mL.  Assessment: 65 yo female with a significant PMHx of risk factors for stroke.  Patient presented at [facility] as a code stroke. After above imaging and tPA, the patient was transferred to [facility] as code IR. IR procedure results are pending at this time.    Recommendations: -Neurology admit to ICU.  -CTH without contrast 6 hours after tPA which would be around 1800 hrs.  -CTH for any neurological status change.  - MRI brain without contrast. -TTE. - BP management per post-VIR protocol  - HbA1c, lipid panel, TSH. - Increase dose of statin if LDL > 70 or change to Atorvastatin.  - No antiplatelet medications or anticoagulants for now. Will leave timing of reinitiation of antiplatet therapy to stroke team in a.m.  - SBP goal - less than 140.  - Telemetry monitoring for arrhythmia. - bedside Swallow screen. - Stroke education. - PT/OT/SLP consult. - Metabolic/infectious workup with CBC, CMP, UA with culture.    This patient is critically ill  and at significant risk of neurological worsening, death and care requires constant monitoring of vital signs, hemodynamics,respiratory and cardiac monitoring, neurological assessment, discussion with family, other specialists and medical decision making of high complexity.   Electronically signed by: Clance Boll, MSN, APN-BC, Nurse Practitioner   Pager: [phone]  I have seen and examined the patient. I have reviewed and amended the assessment and plan. 65 yo female with a significant PMHx of risk factors for stroke.  Patient presented at [facility] as a code stroke. After above imaging and tPA, the patient was transferred to [facility] as code IR. IR procedure results are pending at this time. Admitting to the ICU under the Neurology service for post-VIR orders and stroke work up.  Electronically signed: Dr. Kerney Elbe

## 2020-04-18 NOTE — ED Notes (Signed)
Belongings sent with husband

## 2020-04-18 NOTE — Code Documentation (Signed)
Stroke Response Nurse Documentation Code Stroke Documentation Tracey [name]  is a 65 y.o.  y.o. female  arriving to [place] ED via [facility] on 04/18/2020 with PMH of CAD, MI/PCI in 2010, Hypothyroidism. Patient coming from [facility] where she received tPA, following a neuro change decision made to activate IR. Patient initially from work where she was found with speech difficulty, LKW at 0915. Patient taking aspirin 81 mg daily PTA. Met patient at bridge and escorted to IR suite for intubation and procedure prep, COVID negative. NIHSS 4, see documentation for details and code stroke times. Patient with bilateral leg weakness, Expressive aphasia  and dysarthria  on exam. CT, CTa, CTP completed at [facility]. Handoff with IR RN Patty.  Megan L Mead  Stroke Response RN [phone]  

## 2020-04-18 NOTE — Consult Note (Signed)
CODE STROKE- PHARMACY COMMUNICATION  PTA pt only on ASA. Baseline labs w/in acceptable limits, and no tPA contraindications identified. Pt wt 98.5kg to calculate dose (8.54m bolus over 1 min; f/b 79.876minfusion over 60 min [waste removed: 11.44m76m. NS 35m52mg left to follow infusion.  Time CODE STROKE called/page received:1009  Time response to CODE STROKE was made (in person): 1015  Time Stroke Kit retrieved from [employer] if needed):1025  Name of Provider/Nurse contacted: KathRenold Genta Past Medical History:  Diagnosis Date  . Anxiety   . Bronchitis   . CAD (coronary artery disease)    a. 04/2008: cariac arrest with prox LAD occ s/p stenting, arctic sun. b. 11/2008: restenosis s/p repeat stenting. c. 01/2011: abnormal stress test but cath reportedly OK.  . HyMarland Kitchenothyroid   . [facility]   [facility] and cardiac [facility]    Prior to Admission medications   Medication Sig Start Date End Date Taking? Authorizing Provider  aspirin EC 81 MG tablet Take 81 mg by mouth daily.    [provider]  Cholecalciferol 50 MCG (2000 UT) TABS Take by mouth.    [provider]  citalopram (CELEXA) 10 MG tablet Take 10 mg by mouth daily.    [provider]  cyanocobalamin (,VITAMIN B-12,) 1000 MCG/ML injection INJECT 1.5 MLS INTO THE MUSCLE ONCE MONTHLY 10/17/15   [provider]  fluticasone (FLONASE) 50 MCG/ACT nasal spray Place 2 sprays into the nose daily. Patient not taking: Reported on 03/28/2020    [provider]  furosemide (LASIX) 20 MG tablet Take by mouth. 02/24/19 02/24/20  [provider]  ibuprofen (ADVIL) 600 MG tablet Take 1 tablet (600 mg total) by mouth every 8 (eight) hours as needed. Patient not taking: Reported on 09/26/2019 10/17/18   BaitJearld Fenton  levothyroxine (SYNTHROID, LEVOTHROID) 25 MCG tablet Take 25 mcg by mouth daily before breakfast.    [provider]  metoprolol succinate (TOPROL-XL) 25  MG 24 hr tablet Take 25 mg by mouth daily.    [provider]  nitroGLYCERIN (NITROSTAT) 0.4 MG SL tablet Place 0.4 mg under the tongue every 5 (five) minutes as needed for chest pain. Patient not taking: Reported on 09/26/2019    [provider]  pantoprazole (PROTONIX) 40 MG tablet Take 40 mg by mouth daily.    [provider]  potassium chloride (MICRO-K) 10 MEQ CR capsule Take two tabs by mouth with every dose of as needed lasix (furosemide). 02/25/19 02/25/20  [provider]  ramipril (ALTACE) 5 MG capsule Take 5 mg by mouth daily.    [provider]  rosuvastatin (CRESTOR) 40 MG tablet Take 40 mg by mouth daily.    [provider]    BranLorna DibblearmD Clinical Pharmacist  04/18/2020  11:29 AM

## 2020-04-18 NOTE — ED Notes (Addendum)
Patient resting in bed. All at once patient started screaming "something is wrong". Tracey Fowler, MD called to bedside by this RN. Patient unable to tell RN and MD what was wrong. Speech noted to be slurred. This RN on monitor took patient to CT with Tracey Fowler, MD. TPA stopped at this time. MD aware. 24mL line flush not given.

## 2020-04-18 NOTE — Progress Notes (Signed)
Patient arrived to 4N15. Will review orders and continue to monitor.

## 2020-04-18 NOTE — ED Notes (Signed)
Initiated code stroke to [employer] 1007

## 2020-04-18 NOTE — ED Notes (Signed)
Neurologist at bedside. 

## 2020-04-18 NOTE — ED Notes (Signed)
Patient placed on purewick at this time. 

## 2020-04-18 NOTE — Sedation Documentation (Signed)
Right femoral sheath removed. 8 Fr. angioseal closure device deployed.

## 2020-04-18 NOTE — ED Notes (Signed)
Patient back from CT at this time

## 2020-04-18 NOTE — ED Notes (Signed)
No SCD's in left leg per Neurologist due to known blood clot in left leg.

## 2020-04-18 NOTE — Anesthesia Procedure Notes (Addendum)
Procedure Name: Intubation Date/Time: 04/18/2020 1:24 PM Performed by: Mariea Clonts, CRNA Pre-anesthesia Checklist: Patient identified, Emergency Drugs available, Suction available and Patient being monitored Patient Re-evaluated:Patient Re-evaluated prior to induction Oxygen Delivery Method: Circle System Utilized Preoxygenation: Pre-oxygenation with 100% oxygen Induction Type: IV induction and Rapid sequence Ventilation: Mask ventilation without difficulty Laryngoscope Size: Miller and 2 Grade View: Grade II Tube type: Oral Tube size: 7.0 mm Number of attempts: 1 Airway Equipment and Method: Stylet and Oral airway Placement Confirmation: ETT inserted through vocal cords under direct vision,  positive ETCO2 and breath sounds checked- equal and bilateral Secured at: 21 cm Tube secured with: Tape Dental Injury: Teeth and Oropharynx as per pre-operative assessment

## 2020-04-18 NOTE — ED Notes (Addendum)
Patient to CT at this time with this RN.

## 2020-04-18 NOTE — ED Notes (Signed)
Report given to [facility] at this time.   

## 2020-04-18 NOTE — ED Triage Notes (Signed)
Patient arrives via [facility] with slurred speech. Patient was at work and found by coworkers unable to get her words out. Patient is alert and oriented at this time.

## 2020-04-18 NOTE — ED Notes (Signed)
Pharmacy at bedside

## 2020-04-18 NOTE — ED Notes (Signed)
Report given to Scherrie Merritts, RN at [facility].

## 2020-04-18 NOTE — ED Notes (Signed)
Husband signed consent for transfer.

## 2020-04-18 NOTE — Consult Note (Signed)
Neurology Consultation Reason for Consult: Code stroke Requesting Physician: Valora Piccolo  CC: Aphasia and left ear hearing loss  History is obtained from: Patient, husband at bedside, chart review and ED provider  HPI: Tracey [name] is a 65 y.o. female with a past medical history significant for coronary artery disease (MI s/p LAD stent in 2010), ischemic cardiomyopathy (EF 45% per cardiology notes) peripheral vascular disease, anxiety, hypothyroidism, B12 deficiency, 35-pack-year smoking history (quit in 2010).  She was at work and seem to go into the bathroom at 9:15 AM.  Later around 9:20 AM she was found on the floor aphasic and with some right-sided weakness.  She was already improving on EMS arrival and they noted her blood pressure to be in the 140s over 60s with a glucose of 100.  Patient and husband deny any recent focal neurological symptoms or signs or symptoms of infection or signs and symptoms of bleeding.  Husband does note she has generally been complaining of reduced hearing for some time but the patient reported acute onset left sided hearing loss today at the same time as her other symptoms began.  She is followed by cardiology at [facility].  LKW: 9:15 AM tPA given?:  Yes, started at 11:01 AM  Some delay due to pharmacist not hearing my verbal instructions to start tPA Premorbid modified rankin scale:      1 - No significant disability. Able to carry out all usual activities, despite some symptoms. (Significant leg claudication limits ambulation at baseline)  She initially improved from an NIH of 1 to an NIH of 0 with TPA initiation, but then worsened to an NIH of 8.  Therefore initial plan for observation at [facility] was changed to plan for angiogram and potential thrombectomy for distal left M2 occlusion seen on her CTA  ROS: All other review of systems was negative except as noted in the HPI, limited by intermittent aphasia but confirmed by husband is able  Past Medical  History:  Diagnosis Date  . Anxiety   . Bronchitis   . CAD (coronary artery disease)    a. 04/2008: cariac arrest with prox LAD occ s/p stenting, arctic sun. b. 11/2008: restenosis s/p repeat stenting. c. 01/2011: abnormal stress test but cath reportedly OK.  Marland Kitchen Hypothyroid   . Myocardial infarction [facility]) 2010   stents and cardiac rehab    Past Surgical History:  Procedure Laterality Date  . ANGIOPLASTY  2010   2 stents (femoral)   . TUBAL LIGATION     Current Outpatient Medications  Medication Instructions  . aspirin EC 81 mg, Daily  . Cholecalciferol 2,000 Units, Oral, Daily  . citalopram (CELEXA) 20 mg, Oral, Daily  . cyanocobalamin ((VITAMIN B-12)) 1,000 mcg, Intramuscular, Every 30 days  . levothyroxine (SYNTHROID) 75 mcg, Oral, Daily  . metoprolol succinate (TOPROL-XL) 25 mg, Daily  . nitroGLYCERIN (NITROSTAT) 0.4 mg, Sublingual, Every 5 min PRN  . pantoprazole (PROTONIX) 40 mg, Daily  . ramipril (ALTACE) 5 mg, Oral, Daily  . rosuvastatin (CRESTOR) 40 mg, Daily   Family History  Problem Relation Age of Onset  . Diabetes Mother   . Hypertension Mother   . Stroke Maternal Grandmother   . Diabetes Maternal Grandfather   . Stroke Maternal Grandfather    Social History:  reports that she quit smoking about 11 years ago. Her smoking use included cigarettes. She started smoking about 47 years ago. She smoked 1.50 packs per day. She has never used smokeless tobacco. She reports that  she does not drink alcohol and does not use drugs.  Exam: Current vital signs: BP (!) 130/97   Pulse 93   Temp 98.3 F (36.8 C)   Resp 18   Ht $R'5\' 5"'dJ$  (1.651 m)   Wt 98.5 kg   SpO2 100%   BMI 36.13 kg/m  Vital signs in last 24 hours: Temp:  [98 F (36.7 C)-98.3 F (36.8 C)] 98.3 F (36.8 C) (03/23 1232) Pulse Rate:  [83-127] 93 (03/23 1232) Resp:  [13-26] 18 (03/23 1232) BP: (115-150)/(67-103) 130/97 (03/23 1232) SpO2:  [98 %-100 %] 100 % (03/23 1232) Weight:  [98.5 kg] 98.5 kg (03/23  1033)   Physical Exam  Constitutional: Appears well-developed and well-nourished.  Psych: Affect appropriate to situation, intermittently anxious when her aphasia is worse Eyes: No scleral injection HENT: No oropharyngeal obstruction.  MSK: no joint deformities.  Cardiovascular: Normal rate and regular rhythm.  Respiratory: Effort normal, non-labored breathing GI: Soft.  No distension. There is no tenderness.  Skin: Warm dry and intact visible skin  Neuro: Mental Status: Patient is awake, alert, oriented to person, place, month, year, and situation. Patient is able to give a clear and coherent history. Intermittent aphasia from normal speech at best to severe productive greater than receptive aphasia at worst Cranial Nerves: II: Visual Fields are full. Pupils are equal, round, and reactive to light.   III,IV, VI: EOMI without ptosis or diploplia.  V: Facial sensation is symmetric to temperature VII: Facial movement is symmetric.  VIII: hearing is intact to voice X: Uvula elevates symmetrically XI: Shoulder shrug is symmetric. XII: tongue is midline without atrophy or fasciculations.  Motor: Tone is normal. Bulk is normal.  No pronator drift in bilateral upper extremities.  Both legs are antigravity for at least 5 seconds. Sensory: Sensation is symmetric to light touch and temperature in the arms and legs. Deep Tendon Reflexes: 2+ and symmetric in the biceps and patellae.  Cerebellar: FNF and HKS are intact bilaterally  NIHSS total just prior to transfer 8 Score breakdown: 2 points for being unable to answer level of consciousness questions, one-point for following level of consciousness commands, one-point for partial right hemianopia (right lower quadrant), 2 points for severe aphasia, 1 point for mild dysarthria, 1 point for neglect of the right side (not scored slight drift in bilateral lower extremities given this was nonlocalizing)   I have reviewed labs in epic and the  results pertinent to this consultation are: Creatinine 0.91  I have personally reviewed the images obtained: Head CT without acute intracranial process, potential age-indeterminate white matter hypodensity in the area of code stroke CTA with distal MCA superior division M2 occlusion with 16 mL of tissue at risk on CT perfusion scan  Impression: 3. 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  They expressed some concern about bleeding risk but ultimately decided risks of administration were outweighed by the benefits.  Thrombectomy was also discussed with the patient and it was discussed that given her NIH was low initially she was not a candidate, however when her symptoms worsened she met standard criteria for thrombectomy and a code IR was activated.  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  Discussed with neuroradiology who agreed there is no need for reversal of tPA given the findings - Emergent transfer to [facility] for thrombectomy - Remainder of plan per [facility] and [facility] teams   Lesleigh Noe MD-PhD [facility] [phone] [facility] coverage for [facility] is from 8 AM to 4 AM in-house and 4 PM to 8 PM by telephone/video. 8 PM to 8 AM emergent questions or overnight urgent questions should be addressed to Teleneurology On-call or [name] neurohospitalist; contact information can be found on AMION  Total critical care time: 110 minutes   Critical care time was exclusive of separately billable procedures and treating other patients.   Critical care was necessary to  treat or prevent imminent or life-threatening deterioration.   Critical care was time spent personally by me on the following activities: development of treatment plan with patient and/or surrogate as well as nursing, discussions with consultants/primary team, evaluation of patient's response to treatment, examination of patient, obtaining history from patient or surrogate, ordering and performing treatments and interventions, ordering and review of laboratory studies, ordering and review of radiographic studies, and re-evaluation of patient's condition as needed, as documented above.

## 2020-04-18 NOTE — ED Provider Notes (Signed)
[facility] Emergency Department Provider Note   ____________________________________________   Event Date/Time   First MD Initiated Contact with Patient 04/18/20 1006     (approximate)  I have reviewed the triage vital signs and the nursing notes.   HISTORY  Chief Complaint Aphasia    HPI Tracey [name] is a 65 y.o. female past medical history of CAD, PAD, and hypothyroidism who presents via EMS after being found by a coworker confused and with slurred speech.  EMS state that patient's LVO screen negative however she did have difficulty with identifying objects as well as generally word finding throughout transport.  EMS states that the symptoms would wax and wane.  Patient is unable to elaborate on symptoms but does tell me that the right side of her face was numb initially but has improved.  Patient feels "off" and she continues to say that she is concerned "something is just wrong".  Patient currently denies any vision changes, tinnitus, facial droop, sore throat, chest pain, shortness of breath, abdominal pain, nausea/vomiting/diarrhea, dysuria, or weakness/numbness/paresthesias in any extremity         Past Medical History:  Diagnosis Date  . Anxiety   . Bronchitis   . CAD (coronary artery disease)    a. 04/2008: cariac arrest with prox LAD occ s/p stenting, arctic sun. b. 11/2008: restenosis s/p repeat stenting. c. 01/2011: abnormal stress test but cath reportedly OK.  Marland Kitchen Hypothyroid   . Myocardial infarction [facility]) 2010   stents and cardiac rehab     Patient Active Problem List   Diagnosis Date Noted  . Acute stroke due to ischemia ([place]) 04/18/2020  . Middle cerebral artery embolism, left 04/18/2020  . Atherosclerosis of native arteries of extremity with intermittent claudication ([place]) 10/05/2019  . GERD (gastroesophageal reflux disease) 09/26/2019  . Low serum vitamin D 08/25/2018  . B12 deficiency 11/21/2015  . Acquired hypothyroidism  08/09/2015  . Cardiomyopathy, ischemic 08/09/2015  . Hyperlipidemia, mixed 08/09/2015  . Dizziness 06/13/2012  . CAD (coronary artery disease) 06/13/2012  . H/O irritable bowel syndrome 10/20/2011    Past Surgical History:  Procedure Laterality Date  . ANGIOPLASTY  2010   2 stents (femoral)   . TUBAL LIGATION      Prior to Admission medications   Medication Sig Start Date End Date Taking? Authorizing Provider  aspirin EC 81 MG tablet Take 81 mg by mouth daily.    [provider]  Cholecalciferol 50 MCG (2000 UT) TABS Take 2,000 Units by mouth daily.    [provider]  citalopram (CELEXA) 20 MG tablet Take 20 mg by mouth daily. 04/17/20   [provider]  cyanocobalamin (,VITAMIN B-12,) 1000 MCG/ML injection Inject 1,000 mcg into the muscle every 30 (thirty) days. 10/17/15   [provider]  levothyroxine (SYNTHROID) 75 MCG tablet Take 75 mcg by mouth daily. 04/17/20   [provider]  metoprolol succinate (TOPROL-XL) 25 MG 24 hr tablet Take 25 mg by mouth daily.    [provider]  nitroGLYCERIN (NITROSTAT) 0.4 MG SL tablet Place 0.4 mg under the tongue every 5 (five) minutes as needed for chest pain.    [provider]  pantoprazole (PROTONIX) 40 MG tablet Take 40 mg by mouth daily.    [provider]  ramipril (ALTACE) 5 MG capsule Take 5 mg by mouth daily.    [provider]  rosuvastatin (CRESTOR) 40 MG tablet Take 40 mg by mouth daily.    [provider]    Allergies Sulfa antibiotics  Family History  Problem Relation Age of Onset  . Diabetes Mother   . Hypertension Mother   . Stroke Maternal Grandmother   . Diabetes Maternal Grandfather   . Stroke Maternal Grandfather     Social History Social History   Tobacco Use  . Smoking status: Former Smoker    Packs/day: 1.50    Types: Cigarettes    Start date: 03/22/1973    Quit date: 04/27/2008    Years since quitting: 11.9  .  Smokeless tobacco: Never Used  Vaping Use  . Vaping Use: Never used  Substance Use Topics  . Alcohol use: No    Alcohol/week: 0.0 standard drinks  . Drug use: Never    Review of Systems Constitutional: No fever/chills Eyes: No visual changes. ENT: No sore throat. Cardiovascular: Denies chest pain. Respiratory: Denies shortness of breath. Gastrointestinal: No abdominal pain.  No nausea, no vomiting.  No diarrhea. Genitourinary: Negative for dysuria. Musculoskeletal: Negative for acute arthralgias Skin: Negative for rash. Neurological: Endorses right facial numbness and difficulty speaking.  Patient denies any weakness/paresthesias in any extremity Psychiatric: Negative for suicidal ideation/homicidal ideation   ____________________________________________   PHYSICAL EXAM:  VITAL SIGNS: ED Triage Vitals  Enc Vitals Group     BP      Pulse      Resp      Temp      Temp src      SpO2      Weight      Height      Head Circumference      Peak Flow      Pain Score      Pain Loc      Pain Edu?      Excl. in [place]?    Constitutional: Alert and oriented. Well appearing and in no acute distress. Eyes: Conjunctivae are normal. PERRL. Head: Atraumatic. Nose: No congestion/rhinnorhea. Mouth/Throat: Mucous membranes are moist. Neck: No stridor Cardiovascular: Grossly normal heart sounds.  Good peripheral circulation. Respiratory: Normal respiratory effort.  No retractions. Gastrointestinal: Soft and nontender. No distention. Musculoskeletal: No obvious deformities Neurologic: Slow speech.  Occasionally slurred.  Able to identify a pen but not a key.  Patient initially unable to state who the president was however she improved throughout transport per EMS and is able to answer at this time. Skin:  Skin is warm and dry. No rash noted. Psychiatric: Mood and affect are normal.  Behavior normal.  ____________________________________________   LABS (all labs ordered are listed,  but only abnormal results are displayed)  Labs Reviewed  COMPREHENSIVE METABOLIC PANEL - Abnormal; Notable for the following components:      Result Value   Glucose, Bld 111 (*)    Calcium 8.5 (*)    Total Protein 6.3 (*)    Albumin 3.4 (*)    All other components within normal limits  URINALYSIS, ROUTINE W REFLEX MICROSCOPIC - Abnormal; Notable for the following components:   Color, Urine STRAW (*)    APPearance CLEAR (*)    All other components within normal limits  CBC WITH DIFFERENTIAL/PLATELET - Abnormal; Notable for the following components:   Hemoglobin 11.9 (*)    All other components within normal limits  CBG MONITORING, ED - Abnormal; Notable for the following components:   Glucose-Capillary 103 (*)    All other components within normal limits  RESP PANEL BY RT-PCR (FLU A&B, COVID) ARPGX2  ETHANOL  PROTIME-INR  APTT  URINE DRUG  SCREEN, QUALITATIVE ([facility] ONLY)  CBC  DIFFERENTIAL    RADIOLOGY  ED MD interpretation: CT angiography of the head and neck as well as CT perfusion shows a left distal MCA acute stroke  CT noncontrast of the head shows no evidence of acute abnormalities   Official radiology report(s): CT ANGIO HEAD W OR WO CONTRAST  Result Date: 04/18/2020 CLINICAL DATA:  Neuro deficit, acute, stroke suspected. Additional history provided: Speech difficulty, right facial numbness. EXAM: CT ANGIOGRAPHY HEAD AND NECK CT PERFUSION BRAIN TECHNIQUE: Multidetector CT imaging of the head and neck was performed using the standard protocol during bolus administration of intravenous contrast. Multiplanar CT image reconstructions and MIPs were obtained to evaluate the vascular anatomy. Carotid stenosis measurements (when applicable) are obtained utilizing NASCET criteria, using the distal internal carotid diameter as the denominator. Multiphase CT imaging of the brain was performed following IV bolus contrast injection. Subsequent parametric perfusion maps were calculated  using RAPID software. CONTRAST:  172mL OMNIPAQUE IOHEXOL 350 MG/ML SOLN COMPARISON:  Report from brain MRI 12/15/1998 (images unavailable). FINDINGS: CTA NECK FINDINGS Aortic arch: Standard aortic branching. The visualized aortic arch is normal in caliber. Atherosclerotic plaque within the visualized aortic arch and proximal major branch vessels of the neck. No hemodynamically significant innominate or proximal subclavian artery stenosis. Right carotid system: CCA and ICA patent within the neck without stenosis or significant atherosclerotic disease. Calcified plaque within the proximal ECA. Left carotid system: CCA and ICA patent within the neck without stenosis stenosis. Trace calcified plaque within the carotid bifurcation. Vertebral arteries: Codominant and patent within the neck without stenosis. Skeleton: Cervical spondylosis with multilevel disc space narrowing, disc bulges, uncovertebral hypertrophy. No acute bony abnormality or aggressive osseous lesion. Other neck: No neck mass or cervical lymphadenopathy. Upper chest: No consolidation within the imaged lung apices. Review of the MIP images confirms the above findings CTA HEAD FINDINGS Anterior circulation: The intracranial internal carotid arteries are patent. Mild calcified plaque within both vessels without stenosis. The M1 middle cerebral arteries are patent. There is an occluded distal M2 left MCA branch vessel (series 10, image 52) (series 12, image 27). The anterior cerebral arteries are patent. No intracranial aneurysm is identified. Posterior circulation: The intracranial vertebral arteries are patent. The basilar artery is patent. The posterior cerebral arteries are patent. Posterior communicating arteries are hypoplastic or absent bilaterally. Venous sinuses: Within the limitations of contrast timing, no convincing thrombus. Anatomic variants: As described Review of the MIP images confirms the above findings CT Brain Perfusion Findings: CBF  (<30%) Volume: 48mL Perfusion (Tmax>6.0s) volume: 33mL (posterior left MCA vascular territory) Mismatch Volume: 17mL Infarction Location:None identified These results were called by telephone at the time of interpretation on 04/18/2020 at 10:47 am to provider [facility] , who verbally acknowledged these results. IMPRESSION: CTA neck: The common carotid, internal carotid and vertebral arteries are patent within the neck without hemodynamically significant stenosis. CTA head: 1. Occluded distal M2 left MCA branch vessel. 2. Mild non-stenotic calcified plaque within the intracranial ICAs bilaterally. CT perfusion head: The perfusion software identifies a 16 mL region of hypoperfusion within the posterior left MCA territory utilizing the Tmax>6 seconds threshold. The perfusion software identifies no core infarct. Mismatch volume: 16 mL. Electronically Signed   By: Kellie Simmering DO   On: 04/18/2020 10:59   CT Head Wo Contrast  Result Date: 04/18/2020 CLINICAL DATA:  Provided history: Status post tPA, worsening symptoms. EXAM: CT HEAD WITHOUT CONTRAST TECHNIQUE: Contiguous axial images were obtained from the base  of the skull through the vertex without intravenous contrast. COMPARISON:  Noncontrast head CT, CT angiogram head/neck and CT perfusion FINDINGS: Brain: Cerebral volume is normal for age. Redemonstrated small amount of ill-defined hypoattenuation within the posterior left frontal lobe head in the setting of an acute distal left M2 MCA branch vessel occlusion, this may reflect a small acute white matter infarct or chronic small vessel ischemic disease. Stable background mild cerebral white matter chronic small vessel ischemic disease. There is no acute intracranial hemorrhage. No demarcated cortical infarct. No extra-axial fluid collection. No evidence of intracranial mass. No midline shift. Vascular: Residual circulating intravascular contrast limits evaluation for hyperdense vessels. Skull: No calvarial  fracture. Sinuses/Orbits: Visualized orbits show no acute finding. Tiny mucous retention cyst and small volume frothy secretions within the right sphenoid sinus. These results were called by telephone at the time of interpretation on 04/18/2020 at 12:14 pm to provider Dr. Curly Shores, who verbally acknowledged these results. IMPRESSION: No evidence of acute intracranial hemorrhage status post tPA. No demarcated cortical infarct. As before, there is subtle hypodensity within the posterior left frontal lobe white matter. In the setting of an acute distal left M2 MCA branch occlusion, this may reflect a small acute white matter infarct or chronic small vessel ischemic disease. Stable background mild cerebral white matter chronic small vessel ischemic disease. Right sphenoid sinusitis. Electronically Signed   By: Kellie Simmering DO   On: 04/18/2020 12:26   CT ANGIO NECK W OR WO CONTRAST  Result Date: 04/18/2020 CLINICAL DATA:  Neuro deficit, acute, stroke suspected. Additional history provided: Speech difficulty, right facial numbness. EXAM: CT ANGIOGRAPHY HEAD AND NECK CT PERFUSION BRAIN TECHNIQUE: Multidetector CT imaging of the head and neck was performed using the standard protocol during bolus administration of intravenous contrast. Multiplanar CT image reconstructions and MIPs were obtained to evaluate the vascular anatomy. Carotid stenosis measurements (when applicable) are obtained utilizing NASCET criteria, using the distal internal carotid diameter as the denominator. Multiphase CT imaging of the brain was performed following IV bolus contrast injection. Subsequent parametric perfusion maps were calculated using RAPID software. CONTRAST:  137mL OMNIPAQUE IOHEXOL 350 MG/ML SOLN COMPARISON:  Report from brain MRI 12/15/1998 (images unavailable). FINDINGS: CTA NECK FINDINGS Aortic arch: Standard aortic branching. The visualized aortic arch is normal in caliber. Atherosclerotic plaque within the visualized aortic arch  and proximal major branch vessels of the neck. No hemodynamically significant innominate or proximal subclavian artery stenosis. Right carotid system: CCA and ICA patent within the neck without stenosis or significant atherosclerotic disease. Calcified plaque within the proximal ECA. Left carotid system: CCA and ICA patent within the neck without stenosis stenosis. Trace calcified plaque within the carotid bifurcation. Vertebral arteries: Codominant and patent within the neck without stenosis. Skeleton: Cervical spondylosis with multilevel disc space narrowing, disc bulges, uncovertebral hypertrophy. No acute bony abnormality or aggressive osseous lesion. Other neck: No neck mass or cervical lymphadenopathy. Upper chest: No consolidation within the imaged lung apices. Review of the MIP images confirms the above findings CTA HEAD FINDINGS Anterior circulation: The intracranial internal carotid arteries are patent. Mild calcified plaque within both vessels without stenosis. The M1 middle cerebral arteries are patent. There is an occluded distal M2 left MCA branch vessel (series 10, image 52) (series 12, image 27). The anterior cerebral arteries are patent. No intracranial aneurysm is identified. Posterior circulation: The intracranial vertebral arteries are patent. The basilar artery is patent. The posterior cerebral arteries are patent. Posterior communicating arteries are hypoplastic or absent bilaterally. Venous  sinuses: Within the limitations of contrast timing, no convincing thrombus. Anatomic variants: As described Review of the MIP images confirms the above findings CT Brain Perfusion Findings: CBF (<30%) Volume: 76mL Perfusion (Tmax>6.0s) volume: 59mL (posterior left MCA vascular territory) Mismatch Volume: 73mL Infarction Location:None identified These results were called by telephone at the time of interpretation on 04/18/2020 at 10:47 am to provider [facility] , who verbally acknowledged these results.  IMPRESSION: CTA neck: The common carotid, internal carotid and vertebral arteries are patent within the neck without hemodynamically significant stenosis. CTA head: 1. Occluded distal M2 left MCA branch vessel. 2. Mild non-stenotic calcified plaque within the intracranial ICAs bilaterally. CT perfusion head: The perfusion software identifies a 16 mL region of hypoperfusion within the posterior left MCA territory utilizing the Tmax>6 seconds threshold. The perfusion software identifies no core infarct. Mismatch volume: 16 mL. Electronically Signed   By: Kellie Simmering DO   On: 04/18/2020 10:59   CT CEREBRAL PERFUSION W CONTRAST  Result Date: 04/18/2020 CLINICAL DATA:  Neuro deficit, acute, stroke suspected. Additional history provided: Speech difficulty, right facial numbness. EXAM: CT ANGIOGRAPHY HEAD AND NECK CT PERFUSION BRAIN TECHNIQUE: Multidetector CT imaging of the head and neck was performed using the standard protocol during bolus administration of intravenous contrast. Multiplanar CT image reconstructions and MIPs were obtained to evaluate the vascular anatomy. Carotid stenosis measurements (when applicable) are obtained utilizing NASCET criteria, using the distal internal carotid diameter as the denominator. Multiphase CT imaging of the brain was performed following IV bolus contrast injection. Subsequent parametric perfusion maps were calculated using RAPID software. CONTRAST:  125mL OMNIPAQUE IOHEXOL 350 MG/ML SOLN COMPARISON:  Report from brain MRI 12/15/1998 (images unavailable). FINDINGS: CTA NECK FINDINGS Aortic arch: Standard aortic branching. The visualized aortic arch is normal in caliber. Atherosclerotic plaque within the visualized aortic arch and proximal major branch vessels of the neck. No hemodynamically significant innominate or proximal subclavian artery stenosis. Right carotid system: CCA and ICA patent within the neck without stenosis or significant atherosclerotic disease. Calcified  plaque within the proximal ECA. Left carotid system: CCA and ICA patent within the neck without stenosis stenosis. Trace calcified plaque within the carotid bifurcation. Vertebral arteries: Codominant and patent within the neck without stenosis. Skeleton: Cervical spondylosis with multilevel disc space narrowing, disc bulges, uncovertebral hypertrophy. No acute bony abnormality or aggressive osseous lesion. Other neck: No neck mass or cervical lymphadenopathy. Upper chest: No consolidation within the imaged lung apices. Review of the MIP images confirms the above findings CTA HEAD FINDINGS Anterior circulation: The intracranial internal carotid arteries are patent. Mild calcified plaque within both vessels without stenosis. The M1 middle cerebral arteries are patent. There is an occluded distal M2 left MCA branch vessel (series 10, image 52) (series 12, image 27). The anterior cerebral arteries are patent. No intracranial aneurysm is identified. Posterior circulation: The intracranial vertebral arteries are patent. The basilar artery is patent. The posterior cerebral arteries are patent. Posterior communicating arteries are hypoplastic or absent bilaterally. Venous sinuses: Within the limitations of contrast timing, no convincing thrombus. Anatomic variants: As described Review of the MIP images confirms the above findings CT Brain Perfusion Findings: CBF (<30%) Volume: 68mL Perfusion (Tmax>6.0s) volume: 68mL (posterior left MCA vascular territory) Mismatch Volume: 99mL Infarction Location:None identified These results were called by telephone at the time of interpretation on 04/18/2020 at 10:47 am to provider [facility] , who verbally acknowledged these results. IMPRESSION: CTA neck: The common carotid, internal carotid and vertebral arteries are patent within  the neck without hemodynamically significant stenosis. CTA head: 1. Occluded distal M2 left MCA branch vessel. 2. Mild non-stenotic calcified plaque within  the intracranial ICAs bilaterally. CT perfusion head: The perfusion software identifies a 16 mL region of hypoperfusion within the posterior left MCA territory utilizing the Tmax>6 seconds threshold. The perfusion software identifies no core infarct. Mismatch volume: 16 mL. Electronically Signed   By: Kellie Simmering DO   On: 04/18/2020 10:59   CT HEAD CODE STROKE WO CONTRAST  Addendum Date: 04/18/2020   ADDENDUM REPORT: 04/18/2020 11:12 ADDENDUM: Small amount of white matter hypodensity within the posterior left frontal lobe. In the setting of an acute distal left M2 MCA branch occlusion, this may reflect a small acute white matter infarct or chronic small vessel ischemic disease. These results were called by telephone at the time of interpretation on 04/18/2020 at 11:11 am to provider Dr. Curly Shores, who verbally acknowledged these results. Electronically Signed   By: Kellie Simmering DO   On: 04/18/2020 11:12   Addendum Date: 04/18/2020   ADDENDUM REPORT: 04/18/2020 10:30 ADDENDUM: These results were called by telephone at the time of interpretation on 04/18/2020 at 10:28 am to provider [facility] , who verbally acknowledged these results. Electronically Signed   By: Kellie Simmering DO   On: 04/18/2020 10:30   Result Date: 04/18/2020 CLINICAL DATA:  Code stroke.  Slurred speech. EXAM: CT HEAD WITHOUT CONTRAST TECHNIQUE: Contiguous axial images were obtained from the base of the skull through the vertex without intravenous contrast. COMPARISON:  Report from brain MRI 12/15/1998 (images unavailable). FINDINGS: Brain: Cerebral volume is normal for age. Mild patchy and ill-defined hypoattenuation within the cerebral white matter is nonspecific, but compatible chronic small vessel ischemic disease. There is no acute intracranial hemorrhage. No demarcated cortical infarct. No extra-axial fluid collection. No evidence of intracranial mass. No midline shift. Partially empty sella turcica. Vascular: No hyperdense vessel.   Atherosclerotic calcifications Skull: Normal. Negative for fracture or focal lesion. Sinuses/Orbits: Visualized orbits show no acute finding. Small volume frothy secretions within the right sphenoid sinus. ASPECTS [facility] Stroke Program Early CT Score) - Ganglionic level infarction (caudate, lentiform nuclei, internal capsule, insula, M1-M3 cortex): 7 - Supraganglionic infarction (M4-M6 cortex): 3 Total score (0-10 with 10 being normal): 10 IMPRESSION: No evidence of acute intracranial abnormality.  ASPECTS is 10. Mild cerebral white matter chronic small vessel ischemic disease. Right sphenoid sinusitis. Electronically Signed: By: Kellie Simmering DO On: 04/18/2020 10:26    ____________________________________________   PROCEDURES  Procedure(s) performed (including Critical Care):  Procedures   ____________________________________________   INITIAL IMPRESSION / ASSESSMENT AND PLAN / ED COURSE  As part of my medical decision making, I reviewed the following data within the [place] notes reviewed and incorporated, Labs reviewed, EKG interpreted, Old chart reviewed, Radiograph reviewed and Notes from prior ED visits reviewed and incorporated        PMH risk factors: Hypertension, hypercholesterolemia Neurologic Deficits: Difficulty with word finding, slurred speech Last known Well Time: 2 hours prior to arrival NIH Stroke Score: 3 Given History and Exam I have lower suspicion for infectious etiology, neurologic changes secondary to toxicologic ingestion,seizure, complex migraine. Presentation concerning for possible stroke requiring workup. Workup: Labs: POC glucose, CBC, BMP, LFTs, Troponin, PT/INR, PTT, Type and Screen Other Diagnostics: ECG, CXR, non-contrast head CT followed by CTA brain and neck Interventions: tPA given, patient has been having worsening NIH stroke score and now meets criteria for thrombectomy Consult: Neurology. Discussed with Dr.  Bhagat  regarding patient's neurological symptoms and last well-known time approximately 2 hours prior to arrival and eligibility for TPA criteria. Disposition: Admission to Neurology ICU.       ____________________________________________   FINAL CLINICAL IMPRESSION(S) / ED DIAGNOSES  Final diagnoses:  Acute ischemic left middle cerebral artery (MCA) stroke [facility])     ED Discharge Orders    None       Note:  This document was prepared using Dragon voice recognition software and may include unintentional dictation errors.   Naaman Plummer, MD 04/18/20 [phone]

## 2020-04-18 NOTE — Anesthesia Preprocedure Evaluation (Signed)
Anesthesia Evaluation  Patient identified by MRN, date of birth, ID band Patient awake    Reviewed: Allergy & Precautions, NPO status , Patient's Chart, lab work & pertinent test results  Airway Mallampati: II       Dental no notable dental hx.    Pulmonary former smoker,    Pulmonary exam normal        Cardiovascular hypertension, Pt. on medications and Pt. on home beta blockers + CAD, + Past MI and + Cardiac Stents  Normal cardiovascular exam     Neuro/Psych PSYCHIATRIC DISORDERS Anxiety    GI/Hepatic GERD  Medicated,  Endo/Other  Hypothyroidism   Renal/GU negative Renal ROS  negative genitourinary   Musculoskeletal negative musculoskeletal ROS (+)   Abdominal (+) + obese,   Peds  Hematology negative hematology ROS (+)   Anesthesia Other Findings   Reproductive/Obstetrics                             Anesthesia Physical Anesthesia Plan  ASA: III  Anesthesia Plan: General   Post-op Pain Management:    Induction: Intravenous, Rapid sequence and Cricoid pressure planned  PONV Risk Score and Plan: 3 and Ondansetron, Midazolam and Treatment may vary due to age or medical condition  Airway Management Planned: Oral ETT  Additional Equipment: None  Intra-op Plan:   Post-operative Plan:   Informed Consent: I have reviewed the patients History and Physical, chart, labs and discussed the procedure including the risks, benefits and alternatives for the proposed anesthesia with the patient or authorized representative who has indicated his/her understanding and acceptance.     Dental advisory given  Plan Discussed with: CRNA  Anesthesia Plan Comments:         Anesthesia Quick Evaluation

## 2020-04-18 NOTE — Sedation Documentation (Signed)
7 rings and 1 one bracelet removed from patient and placed together with belongings bag

## 2020-04-18 NOTE — Procedures (Signed)
S/P Lt common carotid arteriogram RT CFA approach. S/P partial revascular revascularization of occluded Lt MCA Inf division distal M2  to distal M3/M4 junction. Post CT brain cortical  hyperdensity over cerebral concexity? Hyperperfusion/hyperemia. No gross ICH or mass effect 4F angioseal for hemostasis in the Rt groin. Distal pulses  Extubated moving all 4s spontaneously RT UE weaker able to raise against gravity. Remains globally aphasic. S.Bralin Garry MD

## 2020-04-18 NOTE — ED Notes (Signed)
Called [facility] for transport to [facility] emergency traffic for LVO 1212

## 2020-04-19 ENCOUNTER — Inpatient Hospital Stay (HOSPITAL_COMMUNITY): Payer: 59

## 2020-04-19 ENCOUNTER — Encounter (HOSPITAL_COMMUNITY): Payer: Self-pay | Admitting: Radiology

## 2020-04-19 DIAGNOSIS — I6602 Occlusion and stenosis of left middle cerebral artery: Secondary | ICD-10-CM

## 2020-04-19 DIAGNOSIS — I739 Peripheral vascular disease, unspecified: Secondary | ICD-10-CM

## 2020-04-19 DIAGNOSIS — I69390 Apraxia following cerebral infarction: Secondary | ICD-10-CM

## 2020-04-19 DIAGNOSIS — I25119 Atherosclerotic heart disease of native coronary artery with unspecified angina pectoris: Secondary | ICD-10-CM

## 2020-04-19 DIAGNOSIS — I639 Cerebral infarction, unspecified: Secondary | ICD-10-CM

## 2020-04-19 LAB — CBC WITH DIFFERENTIAL/PLATELET
Abs Immature Granulocytes: 0.02 10*3/uL (ref 0.00–0.07)
Basophils Absolute: 0 10*3/uL (ref 0.0–0.1)
Basophils Relative: 0 %
Eosinophils Absolute: 0 10*3/uL (ref 0.0–0.5)
Eosinophils Relative: 0 %
HCT: 39.9 % (ref 36.0–46.0)
Hemoglobin: 13 g/dL (ref 12.0–15.0)
Immature Granulocytes: 0 %
Lymphocytes Relative: 6 %
Lymphs Abs: 0.6 10*3/uL — ABNORMAL LOW (ref 0.7–4.0)
MCH: 28.1 pg (ref 26.0–34.0)
MCHC: 32.6 g/dL (ref 30.0–36.0)
MCV: 86.4 fL (ref 80.0–100.0)
Monocytes Absolute: 0.4 10*3/uL (ref 0.1–1.0)
Monocytes Relative: 4 %
Neutro Abs: 8.3 10*3/uL — ABNORMAL HIGH (ref 1.7–7.7)
Neutrophils Relative %: 90 %
Platelets: 238 10*3/uL (ref 150–400)
RBC: 4.62 MIL/uL (ref 3.87–5.11)
RDW: 15.4 % (ref 11.5–15.5)
WBC: 9.3 10*3/uL (ref 4.0–10.5)
nRBC: 0 % (ref 0.0–0.2)

## 2020-04-19 LAB — LIPID PANEL
Cholesterol: 135 mg/dL (ref 0–200)
HDL: 73 mg/dL (ref >40)
LDL Cholesterol: 51 mg/dL (ref 0–99)
Total CHOL/HDL Ratio: 1.8 RATIO
Triglycerides: 55 mg/dL (ref <150)
VLDL: 11 mg/dL (ref 0–40)

## 2020-04-19 LAB — COMPREHENSIVE METABOLIC PANEL
ALT: 16 U/L (ref 0–44)
AST: 20 U/L (ref 15–41)
Albumin: 3.1 g/dL — ABNORMAL LOW (ref 3.5–5.0)
Alkaline Phosphatase: 46 U/L (ref 38–126)
Anion gap: 6 (ref 5–15)
BUN: 12 mg/dL (ref 8–23)
CO2: 23 mmol/L (ref 22–32)
Calcium: 8.5 mg/dL — ABNORMAL LOW (ref 8.9–10.3)
Chloride: 111 mmol/L (ref 98–111)
Creatinine, Ser: 0.8 mg/dL (ref 0.44–1.00)
GFR, Estimated: >60 mL/min (ref >60)
Glucose, Bld: 128 mg/dL — ABNORMAL HIGH (ref 70–99)
Potassium: 3.5 mmol/L (ref 3.5–5.1)
Sodium: 140 mmol/L (ref 135–145)
Total Bilirubin: 0.7 mg/dL (ref 0.3–1.2)
Total Protein: 6 g/dL — ABNORMAL LOW (ref 6.5–8.1)

## 2020-04-19 LAB — TSH: TSH: 0.425 u[IU]/mL (ref 0.350–4.500)

## 2020-04-19 LAB — HEMOGLOBIN A1C
Hgb A1c MFr Bld: 5.4 % (ref 4.8–5.6)
Mean Plasma Glucose: 108.28 mg/dL

## 2020-04-19 MED ORDER — CLOPIDOGREL BISULFATE 75 MG PO TABS
75.0000 mg | ORAL_TABLET | Freq: Every day | ORAL | Status: DC
  Administered 2020-04-19 – 2020-04-23 (×5): 75 mg via ORAL
  Filled 2020-04-19 (×5): qty 1

## 2020-04-19 MED ORDER — BUTALBITAL-APAP-CAFFEINE 50-325-40 MG PO TABS
1.0000 | ORAL_TABLET | Freq: Two times a day (BID) | ORAL | Status: DC | PRN
  Administered 2020-04-19: 1 via ORAL
  Filled 2020-04-19: qty 1

## 2020-04-19 MED ORDER — ASPIRIN EC 81 MG PO TBEC
81.0000 mg | DELAYED_RELEASE_TABLET | Freq: Every day | ORAL | Status: DC
  Administered 2020-04-19 – 2020-04-23 (×5): 81 mg via ORAL
  Filled 2020-04-19 (×5): qty 1

## 2020-04-19 MED ORDER — PANTOPRAZOLE SODIUM 40 MG PO TBEC
40.0000 mg | DELAYED_RELEASE_TABLET | Freq: Every day | ORAL | Status: DC
  Administered 2020-04-19 – 2020-04-23 (×5): 40 mg via ORAL
  Filled 2020-04-19 (×5): qty 1

## 2020-04-19 NOTE — Progress Notes (Addendum)
STROKE TEAM PROGRESS NOTE   INTERVAL HISTORY Overnight she appears to have developed global aphasia. By the time of interview per nurse she had improved. She mostly answered yes to questions regardless of what was asked. She appeared to have difficulty understanding what was asked of her. She did indicate that she had a headache. She is awaiting an MRI due to one of the machines being down today.  Her husband and daughter are at the bedside.  They report about 2 weeks ago she was told she had a DVT but the doctor said to return in about 8 weeks and did not give her any medications.  Contacted Cardiology for TEE.  IR is following Vitals:   04/19/20 0915 04/19/20 0959 04/19/20 1121 04/19/20 1144  BP: (!) 123/103 (!) 109/57 124/66   Pulse: 96 94 82   Resp: (!) 23     Temp:    99.4 F (37.4 C)  TempSrc:    Oral  SpO2: 99%      CBC:  Recent Labs  Lab 04/18/20 1010 04/19/20 0500  WBC 4.2 9.3  NEUTROABS 2.4 8.3*  HGB 11.9* 13.0  HCT 36.6 39.9  MCV 86.5 86.4  PLT 195 100   Basic Metabolic Panel:  Recent Labs  Lab 04/18/20 1010 04/19/20 0500  NA 135 140  K 4.0 3.5  CL 103 111  CO2 26 23  GLUCOSE 111* 128*  BUN 18 12  CREATININE 0.91 0.80  CALCIUM 8.5* 8.5*   Lipid Panel:  Recent Labs  Lab 04/19/20 0500  CHOL 135  TRIG 55  HDL 73  CHOLHDL 1.8  VLDL 11  LDLCALC 51   HgbA1c:  Recent Labs  Lab 04/19/20 0500  HGBA1C 5.4   Urine Drug Screen:  Recent Labs  Lab 04/18/20 1010  LABOPIA NONE DETECTED  COCAINSCRNUR NONE DETECTED  LABBENZ NONE DETECTED  AMPHETMU NONE DETECTED  THCU NONE DETECTED  LABBARB NONE DETECTED    Alcohol Level  Recent Labs  Lab 04/18/20 1010  ETH <10    IMAGING past 24 hours CT Head Wo Contrast  Result Date: 04/18/2020 CLINICAL DATA:  Provided history: Status post tPA, worsening symptoms. EXAM: CT HEAD WITHOUT CONTRAST TECHNIQUE: Contiguous axial images were obtained from the base of the skull through the vertex without intravenous  contrast. COMPARISON:  Noncontrast head CT, CT angiogram head/neck and CT perfusion FINDINGS: Brain: Cerebral volume is normal for age. Redemonstrated small amount of ill-defined hypoattenuation within the posterior left frontal lobe head in the setting of an acute distal left M2 MCA branch vessel occlusion, this may reflect a small acute white matter infarct or chronic small vessel ischemic disease. Stable background mild cerebral white matter chronic small vessel ischemic disease. There is no acute intracranial hemorrhage. No demarcated cortical infarct. No extra-axial fluid collection. No evidence of intracranial mass. No midline shift. Vascular: Residual circulating intravascular contrast limits evaluation for hyperdense vessels. Skull: No calvarial fracture. Sinuses/Orbits: Visualized orbits show no acute finding. Tiny mucous retention cyst and small volume frothy secretions within the right sphenoid sinus. These results were called by telephone at the time of interpretation on 04/18/2020 at 12:14 pm to provider Dr. Curly Shores, who verbally acknowledged these results. IMPRESSION: No evidence of acute intracranial hemorrhage status post tPA. No demarcated cortical infarct. As before, there is subtle hypodensity within the posterior left frontal lobe white matter. In the setting of an acute distal left M2 MCA branch occlusion, this may reflect a small acute white matter infarct or chronic  small vessel ischemic disease. Stable background mild cerebral white matter chronic small vessel ischemic disease. Right sphenoid sinusitis. Electronically Signed   By: Kellie Simmering DO   On: 04/18/2020 12:26   ECHOCARDIOGRAM COMPLETE  Result Date: 04/18/2020    ECHOCARDIOGRAM REPORT   Patient Name:   GEMMA RUAN Schmutz Date of Exam: 04/18/2020 Medical Rec #:  [id number]         Height:       65.0 in Accession #:    [id number]        Weight:       217.1 lb Date of Birth:  [birth year]         BSA:          2.048 m Patient Age:    89  years          BP:           130/97 mmHg Patient Gender: F                 HR:           93 bpm. Exam Location:  Inpatient Procedure: 2D Echo, Cardiac Doppler and Color Doppler Indications:    Stroke  History:        Patient has prior history of Echocardiogram examinations. CAD;                 Risk Factors:Dyslipidemia and Former Smoker. GERD.  Sonographer:    Clayton Lefort RDCS (AE) Referring Phys: [id number] Karsten Fells [facility]  Sonographer Comments: No subcostal window and patient is morbidly obese. Image acquisition challenging due to patient body habitus. Unable to adjust patient position. IMPRESSIONS  1. Left ventricular ejection fraction, by estimation, is 45 to 50%. The left ventricle has mildly decreased function. The left ventricle demonstrates regional wall motion abnormalities (see scoring diagram/findings for description). Mid to apical anteroseptal akinesis. Left ventricular diastolic parameters are indeterminate.  2. Right ventricular systolic function is normal. The right ventricular size is normal. Tricuspid regurgitation signal is inadequate for assessing PA pressure.  3. The mitral valve is normal in structure. No evidence of mitral valve regurgitation.  4. The aortic valve was not well visualized. Aortic valve regurgitation is not visualized. No aortic stenosis is present. FINDINGS  Left Ventricle: Left ventricular ejection fraction, by estimation, is 45 to 50%. The left ventricle has mildly decreased function. The left ventricle demonstrates regional wall motion abnormalities. The left ventricular internal cavity size was normal in size. There is no left ventricular hypertrophy. Left ventricular diastolic parameters are indeterminate.  LV Wall Scoring: The mid and distal anterior septum is akinetic. The apex is hypokinetic. The entire anterior wall, entire lateral wall, entire inferior wall, basal anteroseptal segment, mid inferoseptal segment, and basal inferoseptal segment are normal. Right  Ventricle: The right ventricular size is normal. No increase in right ventricular wall thickness. Right ventricular systolic function is normal. Tricuspid regurgitation signal is inadequate for assessing PA pressure. Left Atrium: Left atrial size was normal in size. Right Atrium: Right atrial size was normal in size. Pericardium: Trivial pericardial effusion is present. Mitral Valve: The mitral valve is normal in structure. No evidence of mitral valve regurgitation. Tricuspid Valve: The tricuspid valve is normal in structure. Tricuspid valve regurgitation is not demonstrated. Aortic Valve: The aortic valve was not well visualized. Aortic valve regurgitation is not visualized. No aortic stenosis is present. Aortic valve mean gradient measures 5.0 mmHg. Aortic valve peak gradient measures 9.7 mmHg. Aortic valve area, by VTI measures  1.80 cm. Pulmonic Valve: The pulmonic valve was not well visualized. Pulmonic valve regurgitation is not visualized. Aorta: The aortic root and ascending aorta are structurally normal, with no evidence of dilitation. IAS/Shunts: The interatrial septum was not well visualized.  LEFT VENTRICLE PLAX 2D LVIDd:         4.50 cm  Diastology LVIDs:         3.10 cm  LV e' medial:  8.59 cm/s LV PW:         0.90 cm  LV e' lateral: 10.40 cm/s LV IVS:        1.00 cm LVOT diam:     2.00 cm LV SV:         62 LV SV Index:   30 LVOT Area:     3.14 cm  RIGHT VENTRICLE RV Basal diam:  3.00 cm RV S prime:     12.30 cm/s TAPSE (M-mode): 2.6 cm LEFT ATRIUM             Index       RIGHT ATRIUM           Index LA diam:        3.40 cm 1.66 cm/m  RA Area:     14.60 cm LA Vol (A2C):   39.0 ml 19.04 ml/m RA Volume:   35.00 ml  17.09 ml/m LA Vol (A4C):   29.3 ml 14.30 ml/m LA Biplane Vol: 36.4 ml 17.77 ml/m  AORTIC VALVE AV Area (Vmax):    1.91 cm AV Area (Vmean):   1.93 cm AV Area (VTI):     1.80 cm AV Vmax:           156.00 cm/s AV Vmean:          110.000 cm/s AV VTI:            0.343 m AV Peak Grad:       9.7 mmHg AV Mean Grad:      5.0 mmHg LVOT Vmax:         94.60 cm/s LVOT Vmean:        67.500 cm/s LVOT VTI:          0.197 m LVOT/AV VTI ratio: 0.57  AORTA Ao Asc diam: 3.00 cm  SHUNTS Systemic VTI:  0.20 m Systemic Diam: 2.00 cm Oswaldo Milian MD Electronically signed by Oswaldo Milian MD Signature Date/Time: 04/18/2020/8:29:19 PM    Final     PHYSICAL EXAM Blood pressure 124/66, pulse 82, temperature 99.4 F (37.4 C), temperature source Oral, resp. rate (!) 23, SpO2 99 %.  General: alert and awake, obese caucasian female, has headache causing some distress  Lungs: Symmetrical Chest rise, no labored breathing  Cardio: Regular Rate and Rhythm  Abdomen: Soft, non-tender  Neuro: Alert, oriented, Global Aphasia- answers most questions with yes, appears to have limited understanding able to mimic but requires multiple promptings. Cranial Nerves: II:  Visual fields appear grossly, pupils equal, round, reactive to light and accommodation III,IV, VI: ptosis not present, extra-ocular motions intact bilaterally V,VII: smile symmetric, facial light touch sensation normal bilaterally VIII: hearing normal bilaterally IX,X: uvula rises symmetrically XI: bilateral shoulder shrug XII: midline tongue extension without atrophy or fasciculations  Motor: Right : Upper extremity   4+/5    Left:     Upper extremity   4+/5  Lower extremity   4+/5    Lower extremity   4+/5 Tone and bulk:normal tone throughout; no atrophy noted Sensory: light touch appears intact throughout, bilaterally  but answers yes to all questions Cerebellar: normal finger-to-nose Gait: deferred    ASSESSMENT/PLAN Ms. LINNEA TODISCO is a 65 y.o. female with history of CAD s/p MI and cardiac arrest with stentins 04/2008, restenting 11/2018, claudication, HTN, hypothryoidism, HLD, and anxiety presenting with aphasia. She was at work and seem to go into the bathroom at 9:15 AM.  Later around 9:20 AM she was found on the  floor aphasic and with some right-sided weakness.  She was already improving on EMS arrival and they noted her blood pressure to be in the 140s over 60s with a glucose of 100.  Patient and husband deny any recent focal neurological symptoms or signs or symptoms of infection or signs and symptoms of bleeding.  Husband does note she has generally been complaining of reduced hearing for some time but the patient reported acute onset left sided hearing loss today at the same time as her other symptoms began. She received tPA at [facility] and was transferred to [facility] and underwent revascularization by IR.   Post tPA her symptoms appeared to improve and post thrombectomy was noted to have aphasia. She is worse then when seen yesterday. Will await MRA. Have contacted Cardiology for a TEE. Will get DVT US due to family reporting diagnosis of DVT about 2 weeks ago.  Left MCA M2 branch embolic stroke secondary to small vessel disease VS white matter infarct  CT head Code Stroke - Small amount of white matter hypodensity within the posterior left frontal lobe. In the setting of an acute distal left M2 MCA branch occlusion, this may reflect a small acute white matter infarct or chronic small vessel ischemic disease.  CT head - No evidence of acute intracranial hemorrhage status post tPA. No demarcated cortical infarct. As before, there is subtle hypodensity within the posterior left frontal lobe white matter. In the setting of an acute distal left M2 MCA branch occlusion, this may reflect a small acute white matter infarct or chronic small vessel ischemic disease. Stable background mild cerebral white matter chronic small vessel ischemic disease. Right sphenoid sinusitis.  CTA head & neck - Occluded distal M2 left MCA branch vessel. Mild non-stenotic calcified plaque within the intracranial ICAs bilaterally.  CT perfusion - The perfusion software identifies a 16 mL region of hypoperfusion within the posterior left MCA  territory utilizing the Tmax>6 seconds threshold. The perfusion software identifies no core infarct. Mismatch volume: 16 mL.  MRA - Ordered  2D Echo - EF:45-50%. No wall motion abnormalities. Mid to apical anteroseptal akinesis.  LDL 51  HgbA1c 5.4  VTE prophylaxis - SCD's    Diet   Diet regular Room service appropriate? Yes with Assist; Fluid consistency: Thin    aspirin 81 mg daily prior to admission, now on aspirin 81 mg daily and clopidogrel 75 mg daily for 3 weeks then continue with Plavix alone.  Therapy recommendations:  CIR  Disposition:  Pending  Hypertension  Home meds:  Ramipril, Metoprolol  Currently on Cleviprex  Stable . Long-term BP goal normotensive  Hyperlipidemia  Home meds:  Crestor 40 mg, resumed in hospital  LDL 51, goal < 70  She is currently at goal will not change at this time.  Continue statin at discharge   Other Stroke Risk Factors  Former Cigarette smoker stopped after MI  Obesity, There is no height or weight on file to calculate BMI., BMI >/= 30 associated with increased stroke risk, recommend weight loss, diet and exercise as appropriate   Family  hx stroke (Maternal Grandmother- Stroke)  Coronary artery disease  MI in 2010  Other Active Problems  Hypothyroidism - Levothyroxine 75 mcg  Hospital day # Baldwin MD Resident  ATTENDING NOTE: I reviewed above note and agree with the assessment and plan. Pt was seen and examined.   65 year old female with history of CAD/MI status post stenting, cardiac arrest during stenting procedure, hypertension, hyperlipidemia, anxiety admitted for slurred speech, aphasia.  CT head showed left frontal burning ischemic changes. Status post TPA.  CTA of the neck showed distal M2 occlusion.  CTP 0/16.  After TPA, patient symptoms since getting worse with right neglect and worsening aphasia, which prompted for transfer to [facility] for thrombectomy.  Patient received a partial recanalization  from thrombectomy yesterday.  EF 45 to 50%, LDL 51, A1c 5.4.  UDS negative.  CT showed small more defined area of acute infarct involving left parietal lobe.  LE venous Doppler negative for DVT.  MRI and MRA pending  On exam, patient awake alert, husband and daughter at bedside, patient sitting in chair, able to answer limited questions with one-word answers.  Able to follow limited midline commands but not peripheral commands however patient able to pantomime.  Attending to both sides, tracking bilaterally, blinking to visual threat bilaterally.  Slight right nasolabial fold flattening, tongue protrusion not corporative.  Moving bilateral upper and lower extremities symmetrically. Sensation, coordination not corporative and gait not tested.  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  Patient also had MRI in 04/2008 and 11/2018, making cardioembolic source is also possible.  Etiology for patient current stroke uncertain, large vessel disease versus cardioembolic source.  Recommend further work-up with TEE and loop recorder.  Start DAPT after 24 hours of TPA and continue statin.  For detailed assessment and plan, please refer to above as I have made changes wherever appropriate.   [name], MD PhD Stroke Neurology 04/19/2020 7:03 PM  This patient is critically ill due to left MCA stroke, status post thrombectomy and a TPA, PAD and at significant risk of neurological worsening, death form recurrent stroke, hemorrhagic conversion, bleeding from TPA, seizure. This patient's care requires constant monitoring of vital signs, hemodynamics, respiratory and cardiac monitoring, review of multiple databases, neurological assessment, discussion with family, other specialists and medical decision making of high complexity. I spent 35 minutes of neurocritical care time in the care of this patient. I had long discussion with husband and daughter at bedside, updated pt  current condition, treatment plan and potential prognosis, and answered all the questions.  They expressed understanding and appreciation.     To contact Stroke Continuity provider, please refer to [email or web address] After hours, contact General Neurology

## 2020-04-19 NOTE — Progress Notes (Signed)
Chief Complaint: Patient was seen today for follow up CVA intervention   Supervising Physician: Luanne Bras  Patient Status: [facility] - In-pt  Subjective: S/P partial revascular revascularization of occluded Lt MCA Inf division distal M2  to distal M3/M4 junction Pt up with PT this am, able to ambulate, then sit in chair. Still has significant aphasia, but improved comprehension Family at bedside.  Objective: Physical Exam: BP (!) 109/57   Pulse 94   Temp 98.5 F (36.9 C) (Oral)   Resp (!) 23   SpO2 99%  Awake, responsive to commands.  Moving all extremities   Current Facility-Administered Medications:  .   stroke: mapping our early stages of recovery book, , Does not apply, Once, Kirby-Graham, Karsten Fells, NP .  0.9 %  sodium chloride infusion, , Intravenous, Continuous, Kirby-Graham, Karsten Fells, NP, Last Rate: 75 mL/hr at 04/19/20 0730, Infusion Verify at 04/19/20 0730 .  0.9 %  sodium chloride infusion, , Intravenous, Continuous, Deveshwar, Sanjeev, MD .  acetaminophen (TYLENOL) tablet 650 mg, 650 mg, Oral, Q4H PRN **OR** acetaminophen (TYLENOL) 160 MG/5ML solution 650 mg, 650 mg, Per Tube, Q4H PRN **OR** acetaminophen (TYLENOL) suppository 650 mg, 650 mg, Rectal, Q4H PRN, Deveshwar, Sanjeev, MD .  chlorhexidine (PERIDEX) 0.12 % solution 15 mL, 15 mL, Mouth Rinse, BID, Kerney Elbe, MD, 15 mL at 04/19/20 1000 .  Chlorhexidine Gluconate Cloth 2 % PADS 6 each, 6 each, Topical, Daily, Kerney Elbe, MD, 6 each at 04/18/20 1539 .  citalopram (CELEXA) tablet 20 mg, 20 mg, Oral, Daily, Kerney Elbe, MD .  clevidipine (CLEVIPREX) infusion 0.5 mg/mL, 0-21 mg/hr, Intravenous, Continuous, Deveshwar, Sanjeev, MD, Last Rate: 8 mL/hr at 04/19/20 0730, 4 mg/hr at 04/19/20 0730 .  [START ON 04/20/2020] cyanocobalamin ((VITAMIN B-12)) injection 1,000 mcg, 1,000 mcg, Intramuscular, Q30 days, Kerney Elbe, MD .  iohexol (OMNIPAQUE) 300 MG/ML solution 150 mL, 150 mL, Intra-arterial, Once PRN,  Deveshwar, Sanjeev, MD .  iohexol (OMNIPAQUE) 300 MG/ML solution 50 mL, 50 mL, Intra-arterial, Once PRN, Deveshwar, Sanjeev, MD .  levothyroxine (SYNTHROID) tablet 75 mcg, 75 mcg, Oral, Daily, Kerney Elbe, MD, 75 mcg at 04/19/20 0957 .  MEDLINE mouth rinse, 15 mL, Mouth Rinse, q12n4p, Kerney Elbe, MD .  metoprolol succinate (TOPROL-XL) 24 hr tablet 25 mg, 25 mg, Oral, Daily, Kerney Elbe, MD, 25 mg at 04/19/20 0959 .  pantoprazole (PROTONIX) injection 40 mg, 40 mg, Intravenous, QHS, Kirby-Graham, Karsten Fells, NP, 40 mg at 04/18/20 2135 .  ramipril (ALTACE) capsule 5 mg, 5 mg, Oral, Daily, Kerney Elbe, MD .  rosuvastatin (CRESTOR) tablet 40 mg, 40 mg, Oral, Daily, Kerney Elbe, MD, 40 mg at 04/19/20 0957 .  senna-docusate (Senokot-S) tablet 1 tablet, 1 tablet, Oral, BID, Kirby-Graham, Karsten Fells, NP, 1 tablet at 04/19/20 0957  Labs: CBC Recent Labs    04/18/20 1010 04/19/20 0500  WBC 4.2 9.3  HGB 11.9* 13.0  HCT 36.6 39.9  PLT 195 238   BMET Recent Labs    04/18/20 1010 04/19/20 0500  NA 135 140  K 4.0 3.5  CL 103 111  CO2 26 23  GLUCOSE 111* 128*  BUN 18 12  CREATININE 0.91 0.80  CALCIUM 8.5* 8.5*   LFT Recent Labs    04/19/20 0500  PROT 6.0*  ALBUMIN 3.1*  AST 20  ALT 16  ALKPHOS 46  BILITOT 0.7   PT/INR Recent Labs    04/18/20 1010  LABPROT 12.2  INR 0.9     Studies/Results: CT ANGIO HEAD  W OR WO CONTRAST  Result Date: 04/18/2020 CLINICAL DATA:  Neuro deficit, acute, stroke suspected. Additional history provided: Speech difficulty, right facial numbness. EXAM: CT ANGIOGRAPHY HEAD AND NECK CT PERFUSION BRAIN TECHNIQUE: Multidetector CT imaging of the head and neck was performed using the standard protocol during bolus administration of intravenous contrast. Multiplanar CT image reconstructions and MIPs were obtained to evaluate the vascular anatomy. Carotid stenosis measurements (when applicable) are obtained utilizing NASCET criteria, using the distal  internal carotid diameter as the denominator. Multiphase CT imaging of the brain was performed following IV bolus contrast injection. Subsequent parametric perfusion maps were calculated using RAPID software. CONTRAST:  129mL OMNIPAQUE IOHEXOL 350 MG/ML SOLN COMPARISON:  Report from brain MRI 12/15/1998 (images unavailable). FINDINGS: CTA NECK FINDINGS Aortic arch: Standard aortic branching. The visualized aortic arch is normal in caliber. Atherosclerotic plaque within the visualized aortic arch and proximal major branch vessels of the neck. No hemodynamically significant innominate or proximal subclavian artery stenosis. Right carotid system: CCA and ICA patent within the neck without stenosis or significant atherosclerotic disease. Calcified plaque within the proximal ECA. Left carotid system: CCA and ICA patent within the neck without stenosis stenosis. Trace calcified plaque within the carotid bifurcation. Vertebral arteries: Codominant and patent within the neck without stenosis. Skeleton: Cervical spondylosis with multilevel disc space narrowing, disc bulges, uncovertebral hypertrophy. No acute bony abnormality or aggressive osseous lesion. Other neck: No neck mass or cervical lymphadenopathy. Upper chest: No consolidation within the imaged lung apices. Review of the MIP images confirms the above findings CTA HEAD FINDINGS Anterior circulation: The intracranial internal carotid arteries are patent. Mild calcified plaque within both vessels without stenosis. The M1 middle cerebral arteries are patent. There is an occluded distal M2 left MCA branch vessel (series 10, image 52) (series 12, image 27). The anterior cerebral arteries are patent. No intracranial aneurysm is identified. Posterior circulation: The intracranial vertebral arteries are patent. The basilar artery is patent. The posterior cerebral arteries are patent. Posterior communicating arteries are hypoplastic or absent bilaterally. Venous sinuses:  Within the limitations of contrast timing, no convincing thrombus. Anatomic variants: As described Review of the MIP images confirms the above findings CT Brain Perfusion Findings: CBF (<30%) Volume: 100mL Perfusion (Tmax>6.0s) volume: 74mL (posterior left MCA vascular territory) Mismatch Volume: 23mL Infarction Location:None identified These results were called by telephone at the time of interpretation on 04/18/2020 at 10:47 am to provider [facility] , who verbally acknowledged these results. IMPRESSION: CTA neck: The common carotid, internal carotid and vertebral arteries are patent within the neck without hemodynamically significant stenosis. CTA head: 1. Occluded distal M2 left MCA branch vessel. 2. Mild non-stenotic calcified plaque within the intracranial ICAs bilaterally. CT perfusion head: The perfusion software identifies a 16 mL region of hypoperfusion within the posterior left MCA territory utilizing the Tmax>6 seconds threshold. The perfusion software identifies no core infarct. Mismatch volume: 16 mL. Electronically Signed   By: Kellie Simmering DO   On: 04/18/2020 10:59   CT Head Wo Contrast  Result Date: 04/18/2020 CLINICAL DATA:  Provided history: Status post tPA, worsening symptoms. EXAM: CT HEAD WITHOUT CONTRAST TECHNIQUE: Contiguous axial images were obtained from the base of the skull through the vertex without intravenous contrast. COMPARISON:  Noncontrast head CT, CT angiogram head/neck and CT perfusion FINDINGS: Brain: Cerebral volume is normal for age. Redemonstrated small amount of ill-defined hypoattenuation within the posterior left frontal lobe head in the setting of an acute distal left M2 MCA branch vessel occlusion, this  may reflect a small acute white matter infarct or chronic small vessel ischemic disease. Stable background mild cerebral white matter chronic small vessel ischemic disease. There is no acute intracranial hemorrhage. No demarcated cortical infarct. No extra-axial  fluid collection. No evidence of intracranial mass. No midline shift. Vascular: Residual circulating intravascular contrast limits evaluation for hyperdense vessels. Skull: No calvarial fracture. Sinuses/Orbits: Visualized orbits show no acute finding. Tiny mucous retention cyst and small volume frothy secretions within the right sphenoid sinus. These results were called by telephone at the time of interpretation on 04/18/2020 at 12:14 pm to provider Dr. Curly Shores, who verbally acknowledged these results. IMPRESSION: No evidence of acute intracranial hemorrhage status post tPA. No demarcated cortical infarct. As before, there is subtle hypodensity within the posterior left frontal lobe white matter. In the setting of an acute distal left M2 MCA branch occlusion, this may reflect a small acute white matter infarct or chronic small vessel ischemic disease. Stable background mild cerebral white matter chronic small vessel ischemic disease. Right sphenoid sinusitis. Electronically Signed   By: Kellie Simmering DO   On: 04/18/2020 12:26   CT ANGIO NECK W OR WO CONTRAST  Result Date: 04/18/2020 CLINICAL DATA:  Neuro deficit, acute, stroke suspected. Additional history provided: Speech difficulty, right facial numbness. EXAM: CT ANGIOGRAPHY HEAD AND NECK CT PERFUSION BRAIN TECHNIQUE: Multidetector CT imaging of the head and neck was performed using the standard protocol during bolus administration of intravenous contrast. Multiplanar CT image reconstructions and MIPs were obtained to evaluate the vascular anatomy. Carotid stenosis measurements (when applicable) are obtained utilizing NASCET criteria, using the distal internal carotid diameter as the denominator. Multiphase CT imaging of the brain was performed following IV bolus contrast injection. Subsequent parametric perfusion maps were calculated using RAPID software. CONTRAST:  141mL OMNIPAQUE IOHEXOL 350 MG/ML SOLN COMPARISON:  Report from brain MRI 12/15/1998 (images  unavailable). FINDINGS: CTA NECK FINDINGS Aortic arch: Standard aortic branching. The visualized aortic arch is normal in caliber. Atherosclerotic plaque within the visualized aortic arch and proximal major branch vessels of the neck. No hemodynamically significant innominate or proximal subclavian artery stenosis. Right carotid system: CCA and ICA patent within the neck without stenosis or significant atherosclerotic disease. Calcified plaque within the proximal ECA. Left carotid system: CCA and ICA patent within the neck without stenosis stenosis. Trace calcified plaque within the carotid bifurcation. Vertebral arteries: Codominant and patent within the neck without stenosis. Skeleton: Cervical spondylosis with multilevel disc space narrowing, disc bulges, uncovertebral hypertrophy. No acute bony abnormality or aggressive osseous lesion. Other neck: No neck mass or cervical lymphadenopathy. Upper chest: No consolidation within the imaged lung apices. Review of the MIP images confirms the above findings CTA HEAD FINDINGS Anterior circulation: The intracranial internal carotid arteries are patent. Mild calcified plaque within both vessels without stenosis. The M1 middle cerebral arteries are patent. There is an occluded distal M2 left MCA branch vessel (series 10, image 52) (series 12, image 27). The anterior cerebral arteries are patent. No intracranial aneurysm is identified. Posterior circulation: The intracranial vertebral arteries are patent. The basilar artery is patent. The posterior cerebral arteries are patent. Posterior communicating arteries are hypoplastic or absent bilaterally. Venous sinuses: Within the limitations of contrast timing, no convincing thrombus. Anatomic variants: As described Review of the MIP images confirms the above findings CT Brain Perfusion Findings: CBF (<30%) Volume: 67mL Perfusion (Tmax>6.0s) volume: 8mL (posterior left MCA vascular territory) Mismatch Volume: 4mL Infarction  Location:None identified These results were called by telephone at the  time of interpretation on 04/18/2020 at 10:47 am to provider [facility] , who verbally acknowledged these results. IMPRESSION: CTA neck: The common carotid, internal carotid and vertebral arteries are patent within the neck without hemodynamically significant stenosis. CTA head: 1. Occluded distal M2 left MCA branch vessel. 2. Mild non-stenotic calcified plaque within the intracranial ICAs bilaterally. CT perfusion head: The perfusion software identifies a 16 mL region of hypoperfusion within the posterior left MCA territory utilizing the Tmax>6 seconds threshold. The perfusion software identifies no core infarct. Mismatch volume: 16 mL. Electronically Signed   By: Kellie Simmering DO   On: 04/18/2020 10:59   CT CEREBRAL PERFUSION W CONTRAST  Result Date: 04/18/2020 CLINICAL DATA:  Neuro deficit, acute, stroke suspected. Additional history provided: Speech difficulty, right facial numbness. EXAM: CT ANGIOGRAPHY HEAD AND NECK CT PERFUSION BRAIN TECHNIQUE: Multidetector CT imaging of the head and neck was performed using the standard protocol during bolus administration of intravenous contrast. Multiplanar CT image reconstructions and MIPs were obtained to evaluate the vascular anatomy. Carotid stenosis measurements (when applicable) are obtained utilizing NASCET criteria, using the distal internal carotid diameter as the denominator. Multiphase CT imaging of the brain was performed following IV bolus contrast injection. Subsequent parametric perfusion maps were calculated using RAPID software. CONTRAST:  188mL OMNIPAQUE IOHEXOL 350 MG/ML SOLN COMPARISON:  Report from brain MRI 12/15/1998 (images unavailable). FINDINGS: CTA NECK FINDINGS Aortic arch: Standard aortic branching. The visualized aortic arch is normal in caliber. Atherosclerotic plaque within the visualized aortic arch and proximal major branch vessels of the neck. No hemodynamically  significant innominate or proximal subclavian artery stenosis. Right carotid system: CCA and ICA patent within the neck without stenosis or significant atherosclerotic disease. Calcified plaque within the proximal ECA. Left carotid system: CCA and ICA patent within the neck without stenosis stenosis. Trace calcified plaque within the carotid bifurcation. Vertebral arteries: Codominant and patent within the neck without stenosis. Skeleton: Cervical spondylosis with multilevel disc space narrowing, disc bulges, uncovertebral hypertrophy. No acute bony abnormality or aggressive osseous lesion. Other neck: No neck mass or cervical lymphadenopathy. Upper chest: No consolidation within the imaged lung apices. Review of the MIP images confirms the above findings CTA HEAD FINDINGS Anterior circulation: The intracranial internal carotid arteries are patent. Mild calcified plaque within both vessels without stenosis. The M1 middle cerebral arteries are patent. There is an occluded distal M2 left MCA branch vessel (series 10, image 52) (series 12, image 27). The anterior cerebral arteries are patent. No intracranial aneurysm is identified. Posterior circulation: The intracranial vertebral arteries are patent. The basilar artery is patent. The posterior cerebral arteries are patent. Posterior communicating arteries are hypoplastic or absent bilaterally. Venous sinuses: Within the limitations of contrast timing, no convincing thrombus. Anatomic variants: As described Review of the MIP images confirms the above findings CT Brain Perfusion Findings: CBF (<30%) Volume: 4mL Perfusion (Tmax>6.0s) volume: 30mL (posterior left MCA vascular territory) Mismatch Volume: 38mL Infarction Location:None identified These results were called by telephone at the time of interpretation on 04/18/2020 at 10:47 am to provider [facility] , who verbally acknowledged these results. IMPRESSION: CTA neck: The common carotid, internal carotid and  vertebral arteries are patent within the neck without hemodynamically significant stenosis. CTA head: 1. Occluded distal M2 left MCA branch vessel. 2. Mild non-stenotic calcified plaque within the intracranial ICAs bilaterally. CT perfusion head: The perfusion software identifies a 16 mL region of hypoperfusion within the posterior left MCA territory utilizing the Tmax>6 seconds threshold. The perfusion software identifies  no core infarct. Mismatch volume: 16 mL. Electronically Signed   By: Kellie Simmering DO   On: 04/18/2020 10:59   ECHOCARDIOGRAM COMPLETE  Result Date: 04/18/2020    ECHOCARDIOGRAM REPORT   Patient Name:   Tracey [name] Date of Exam: 04/18/2020 Medical Rec #:  [id number]         Height:       65.0 in Accession #:    [id number]        Weight:       217.1 lb Date of Birth:  05/29/1955         BSA:          2.048 m Patient Age:    65 years          BP:           130/97 mmHg Patient Gender: F                 HR:           93 bpm. Exam Location:  Inpatient Procedure: 2D Echo, Cardiac Doppler and Color Doppler Indications:    Stroke  History:        Patient has prior history of Echocardiogram examinations. CAD;                 Risk Factors:Dyslipidemia and Former Smoker. GERD.  Sonographer:    Clayton Lefort RDCS (AE) Referring Phys: [id number] Karsten Fells [facility]  Sonographer Comments: No subcostal window and patient is morbidly obese. Image acquisition challenging due to patient body habitus. Unable to adjust patient position. IMPRESSIONS  1. Left ventricular ejection fraction, by estimation, is 45 to 50%. The left ventricle has mildly decreased function. The left ventricle demonstrates regional wall motion abnormalities (see scoring diagram/findings for description). Mid to apical anteroseptal akinesis. Left ventricular diastolic parameters are indeterminate.  2. Right ventricular systolic function is normal. The right ventricular size is normal. Tricuspid regurgitation signal is inadequate for assessing PA  pressure.  3. The mitral valve is normal in structure. No evidence of mitral valve regurgitation.  4. The aortic valve was not well visualized. Aortic valve regurgitation is not visualized. No aortic stenosis is present. FINDINGS  Left Ventricle: Left ventricular ejection fraction, by estimation, is 45 to 50%. The left ventricle has mildly decreased function. The left ventricle demonstrates regional wall motion abnormalities. The left ventricular internal cavity size was normal in size. There is no left ventricular hypertrophy. Left ventricular diastolic parameters are indeterminate.  LV Wall Scoring: The mid and distal anterior septum is akinetic. The apex is hypokinetic. The entire anterior wall, entire lateral wall, entire inferior wall, basal anteroseptal segment, mid inferoseptal segment, and basal inferoseptal segment are normal. Right Ventricle: The right ventricular size is normal. No increase in right ventricular wall thickness. Right ventricular systolic function is normal. Tricuspid regurgitation signal is inadequate for assessing PA pressure. Left Atrium: Left atrial size was normal in size. Right Atrium: Right atrial size was normal in size. Pericardium: Trivial pericardial effusion is present. Mitral Valve: The mitral valve is normal in structure. No evidence of mitral valve regurgitation. Tricuspid Valve: The tricuspid valve is normal in structure. Tricuspid valve regurgitation is not demonstrated. Aortic Valve: The aortic valve was not well visualized. Aortic valve regurgitation is not visualized. No aortic stenosis is present. Aortic valve mean gradient measures 5.0 mmHg. Aortic valve peak gradient measures 9.7 mmHg. Aortic valve area, by VTI measures 1.80 cm. Pulmonic Valve: The pulmonic valve was not well visualized. Pulmonic  valve regurgitation is not visualized. Aorta: The aortic root and ascending aorta are structurally normal, with no evidence of dilitation. IAS/Shunts: The interatrial septum  was not well visualized.  LEFT VENTRICLE PLAX 2D LVIDd:         4.50 cm  Diastology LVIDs:         3.10 cm  LV e' medial:  8.59 cm/s LV PW:         0.90 cm  LV e' lateral: 10.40 cm/s LV IVS:        1.00 cm LVOT diam:     2.00 cm LV SV:         62 LV SV Index:   30 LVOT Area:     3.14 cm  RIGHT VENTRICLE RV Basal diam:  3.00 cm RV S prime:     12.30 cm/s TAPSE (M-mode): 2.6 cm LEFT ATRIUM             Index       RIGHT ATRIUM           Index LA diam:        3.40 cm 1.66 cm/m  RA Area:     14.60 cm LA Vol (A2C):   39.0 ml 19.04 ml/m RA Volume:   35.00 ml  17.09 ml/m LA Vol (A4C):   29.3 ml 14.30 ml/m LA Biplane Vol: 36.4 ml 17.77 ml/m  AORTIC VALVE AV Area (Vmax):    1.91 cm AV Area (Vmean):   1.93 cm AV Area (VTI):     1.80 cm AV Vmax:           156.00 cm/s AV Vmean:          110.000 cm/s AV VTI:            0.343 m AV Peak Grad:      9.7 mmHg AV Mean Grad:      5.0 mmHg LVOT Vmax:         94.60 cm/s LVOT Vmean:        67.500 cm/s LVOT VTI:          0.197 m LVOT/AV VTI ratio: 0.57  AORTA Ao Asc diam: 3.00 cm  SHUNTS Systemic VTI:  0.20 m Systemic Diam: 2.00 cm Oswaldo Milian MD Electronically signed by Oswaldo Milian MD Signature Date/Time: 04/18/2020/8:29:19 PM    Final    CT HEAD CODE STROKE WO CONTRAST  Addendum Date: 04/18/2020   ADDENDUM REPORT: 04/18/2020 11:12 ADDENDUM: Small amount of white matter hypodensity within the posterior left frontal lobe. In the setting of an acute distal left M2 MCA branch occlusion, this may reflect a small acute white matter infarct or chronic small vessel ischemic disease. These results were called by telephone at the time of interpretation on 04/18/2020 at 11:11 am to provider Dr. Curly Shores, who verbally acknowledged these results. Electronically Signed   By: Kellie Simmering DO   On: 04/18/2020 11:12   Addendum Date: 04/18/2020   ADDENDUM REPORT: 04/18/2020 10:30 ADDENDUM: These results were called by telephone at the time of interpretation on 04/18/2020 at  10:28 am to provider [facility] , who verbally acknowledged these results. Electronically Signed   By: Kellie Simmering DO   On: 04/18/2020 10:30   Result Date: 04/18/2020 CLINICAL DATA:  Code stroke.  Slurred speech. EXAM: CT HEAD WITHOUT CONTRAST TECHNIQUE: Contiguous axial images were obtained from the base of the skull through the vertex without intravenous contrast. COMPARISON:  Report from brain MRI 12/15/1998 (images unavailable). FINDINGS:  Brain: Cerebral volume is normal for age. Mild patchy and ill-defined hypoattenuation within the cerebral white matter is nonspecific, but compatible chronic small vessel ischemic disease. There is no acute intracranial hemorrhage. No demarcated cortical infarct. No extra-axial fluid collection. No evidence of intracranial mass. No midline shift. Partially empty sella turcica. Vascular: No hyperdense vessel.  Atherosclerotic calcifications Skull: Normal. Negative for fracture or focal lesion. Sinuses/Orbits: Visualized orbits show no acute finding. Small volume frothy secretions within the right sphenoid sinus. ASPECTS [facility] Stroke Program Early CT Score) - Ganglionic level infarction (caudate, lentiform nuclei, internal capsule, insula, M1-M3 cortex): 7 - Supraganglionic infarction (M4-M6 cortex): 3 Total score (0-10 with 10 being normal): 10 IMPRESSION: No evidence of acute intracranial abnormality.  ASPECTS is 10. Mild cerebral white matter chronic small vessel ischemic disease. Right sphenoid sinusitis. Electronically Signed: By: Kellie Simmering DO On: 04/18/2020 10:26    Assessment/Plan: S/P partial revascular revascularization of occluded Lt MCA Inf division distal M2  to distal M3/M4 junction Much improved from yesterday, including aphasia For MRI/MRA today     LOS: 1 day   I spent a total of 15 minutes in face to face in clinical consultation, greater than 50% of which was counseling/coordinating care for CVA post intervention  Ascencion Dike  PA-C 04/19/2020 10:10 AM

## 2020-04-19 NOTE — Progress Notes (Signed)
Received report from [facility].

## 2020-04-19 NOTE — Anesthesia Postprocedure Evaluation (Signed)
Anesthesia Post Note  Patient: Tracey [name]  Procedure(s) Performed: IR WITH ANESTHESIA (N/A )     Patient location during evaluation: PACU Anesthesia Type: General Level of consciousness: awake and sedated Pain management: pain level controlled Vital Signs Assessment: post-procedure vital signs reviewed and stable Respiratory status: spontaneous breathing Cardiovascular status: stable Postop Assessment: no apparent nausea or vomiting Anesthetic complications: no   No complications documented.  Last Vitals:  Vitals:   04/19/20 1300 04/19/20 1330  BP: 99/74 (!) 110/57  Pulse: 80 73  Resp:    Temp:    SpO2: 98% 96%    Last Pain:  Vitals:   04/19/20 1144  TempSrc: Oral  PainSc:                  [name]

## 2020-04-19 NOTE — Progress Notes (Signed)
    [facility] has been requested to perform a transesophageal echocardiogram on Tracey [name] for stoke.  After careful review of history and examination, the risks and benefits of transesophageal echocardiogram have been explained to the patient and family including risks of esophageal damage, perforation (1:10,000 risk), bleeding, pharyngeal hematoma as well as other potential complications associated with conscious sedation including aspiration, arrhythmia, respiratory failure and death. Alternatives to treatment were discussed, questions were answered. Patient and family are willing to proceed.   Kathyrn Drown, NP  04/19/2020 4:31 PM

## 2020-04-19 NOTE — Evaluation (Signed)
Speech Language Pathology Evaluation Patient Details Name: Tracey [name] MRN: [id number] DOB: 01/16/1956 Today's Date: 04/19/2020 Time: 0912-0930 SLP Time Calculation (min) (ACUTE ONLY): 18 min  Problem List:  Patient Active Problem List   Diagnosis Date Noted  . Acute stroke due to ischemia ([place]) 04/18/2020  . Middle cerebral artery embolism, left 04/18/2020  . Atherosclerosis of native arteries of extremity with intermittent claudication ([place]) 10/05/2019  . GERD (gastroesophageal reflux disease) 09/26/2019  . Low serum vitamin D 08/25/2018  . B12 deficiency 11/21/2015  . Acquired hypothyroidism 08/09/2015  . Cardiomyopathy, ischemic 08/09/2015  . Hyperlipidemia, mixed 08/09/2015  . Dizziness 06/13/2012  . CAD (coronary artery disease) 06/13/2012  . H/O irritable bowel syndrome 10/20/2011   Past Medical History:  Past Medical History:  Diagnosis Date  . Anxiety   . Bronchitis   . CAD (coronary artery disease)    a. 04/2008: cariac arrest with prox LAD occ s/p stenting, arctic sun. b. 11/2008: restenosis s/p repeat stenting. c. 01/2011: abnormal stress test but cath reportedly OK.  Marland Kitchen Hypothyroid   . Myocardial infarction [facility]) 2010   stents and cardiac rehab    Past Surgical History:  Past Surgical History:  Procedure Laterality Date  . ANGIOPLASTY  2010   2 stents (femoral)   . RADIOLOGY WITH ANESTHESIA N/A 04/18/2020   Procedure: IR WITH ANESTHESIA;  Surgeon: Radiologist, Medication, MD;  Location: [place];  Service: Radiology;  Laterality: N/A;  . TUBAL LIGATION     HPI:  Tracey [name] is a 65 y.o. female with a PMHx of CAD s/p MI, cardiac arrest, HTN, hypothryoidism, HLD, and anxiety. Presented to [facility] ED with slurred speech and and word finding difficulty. CTA head and neck showed a distal M2 occlusion in left MCA branch. Per chart tPA emergently stopped when increased dysarthria noted> emergent head CT showed no intracranial hemorrhage. Underwent revascularization  of occluded Lt MCA following left MCA embolism.   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 She comprehended approximately 40% of basic information involving yes/no, 1 step commands further impacted by motor aapraxia. She is dysfluent with significant anomia, difficutly initiating language and could not repeat single words. She has spontaneous relevant phrase, exhibits frustration and emergently recognizes errors some of the time. Challenges with object identification from field of 3. Tracey [name] read words inconsistently but could not attach meaning paired with verbal stimulation. Phrase completion and phonemic cues not effective during eval.    SLP Assessment  SLP Recommendation/Assessment: Patient needs continued Speech Lanaguage Pathology Services SLP Visit Diagnosis: Aphasia (R47.01);Cognitive communication deficit (R41.841)    Follow Up Recommendations  Inpatient Rehab    Frequency and Duration min 2x/week  2 weeks      SLP Evaluation Cognition  Overall Cognitive Status: Difficult to assess Arousal/Alertness: Awake/alert Orientation Level: Oriented to person;Oriented to place (via yes/no) Attention: Sustained Sustained Attention: Impaired Memory:  (TBA) Awareness: Impaired Awareness Impairment: Anticipatory impairment Problem Solving:  (TBA) Safety/Judgment: Impaired       Comprehension  Auditory Comprehension Overall Auditory Comprehension: Impaired Yes/No Questions: Impaired (45%) Basic Biographical Questions: 26-50% accurate Commands: Impaired One Step Basic Commands: 50-74% accurate (50-60% motor apraxia) Visual Recognition/Discrimination Discrimination: Not tested Reading Comprehension Reading Status: Impaired Word level: Impaired    Expression Expression Primary Mode of Expression: Verbal Verbal Expression Overall Verbal Expression:  Impaired Initiation: Impaired Level of Generative/Spontaneous Verbalization: Word;Phrase Repetition: Impaired Level of Impairment:  Word level Naming: Impairment Responsive: Not tested Confrontation: Impaired (0%) Verbal Errors: Aware of errors Pragmatics: No impairment Written Expression Dominant Hand: Right Written Expression:  (TBA)   Oral / Motor  Oral Motor/Sensory Function Overall Oral Motor/Sensory Function: Within functional limits Motor Speech Overall Motor Speech: Appears within functional limits for tasks assessed Respiration: Within functional limits Phonation: Normal Resonance: Within functional limits Articulation: Within functional limitis Intelligibility: Intelligible Motor Planning: Impaired Level of Impairment: Word Motor Speech Errors: Inconsistent                       Houston Siren 04/19/2020, 10:27 AM Orbie Pyo Colvin Caroli.Ed Risk analyst [phone] Office [phone]

## 2020-04-19 NOTE — Progress Notes (Signed)
PT Cancellation Note  Patient Details Name: Tracey [name] MRN: [id number] DOB: November 17, 1955   Cancelled Treatment:    Reason Eval/Treat Not Completed: Active bedrest order this morning. PT will continue to follow and evaluate as appropriate.   Hardie Pulley, DPT   Acute Rehabilitation Department Pager #: [phone]   Otho Bellows 04/19/2020, 7:46 AM

## 2020-04-19 NOTE — Progress Notes (Signed)
Rehab Admissions Coordinator Note:  Patient was screened by Cleatrice Burke for appropriateness for an Inpatient Acute Rehab Consult per therapy recs. .  At this time, we are recommending Inpatient Rehab consult. I will place order per protocol.  Cleatrice Burke RN MSN 04/19/2020, 10:23 AM  I can be reached at [phone].

## 2020-04-19 NOTE — Evaluation (Signed)
Physical Therapy Evaluation Patient Details Name: Tracey [name] MRN: [id number] DOB: Oct 11, 1955 Today's Date: 04/19/2020   History of Present Illness  The pt is a 65 yo female presenting 3/23 with slurred speech and expressive aphasia. Imaging revealed L distal MCA occlusion, and pt is now s/p partial revascularization on 3/23. PMH includes: HLD, PAD, GERD, MI (s/p stent placement and cardiac rehab, followed by [facility]), CAD.  Clinical Impression  Pt in bed upon arrival of PT, agreeable to evaluation at this time. Prior to admission the pt was independent with all mobility and ADLs, worked in [place], lived with husband in a home with 3 steps to enter. The pt now presents with limitations in functional mobility, stability, coordination, strength, and endurance that are further impacted by R-sided inattention and decreased safety awareness due to above dx, and will continue to benefit from skilled PT to address these deficits. The pt is further challenged by expressive aphasia that limits her ability to communicate needs/ask questions of therapists as well as receptive aphasia that results in inconsistent command following through session. The pt was able to complete initial bed mobility and transfers with minA of 2 for safety, but was able to progress to minA of 1 through [facility] with +2 for line management. The pt requires assist to steady, had x2 LOB to R requiring assist to recover due to poor reactions and awareness. The pt was limited to short bout of ambulation in the room due to HR increased to 140s and increased grimacing and indications of increased headache with mobility. The pt will continue to benefit from skilled PT to progress functional mobility, stability and endurance to improve safety and independence with transfers and gait prior to eventual return home.      Follow Up Recommendations CIR    Equipment Recommendations   (defer to post acute)    Recommendations for Other Services Rehab  consult     Precautions / Restrictions Precautions Precautions: Fall Restrictions Weight Bearing Restrictions: No      Mobility  Bed Mobility Overal bed mobility: Needs Assistance Bed Mobility: Supine to Sit     Supine to sit: Mod assist;+2 for physical assistance;HOB elevated     General bed mobility comments: Mod A to bring BELs towards EOB and initate task. Pt then able to bring trunk up with Min support. +2 for safety    Transfers Overall transfer level: Needs assistance Equipment used: 2 person hand held assist Transfers: Sit to/from Stand Sit to Stand: Min assist;+2 physical assistance;+2 safety/equipment         General transfer comment: MIn A for power up and gaining balance, progressed from minA of 2 to minA of 1 within session with +2 for line management  Ambulation/Gait Ambulation/Gait assistance: Min assist;+2 safety/equipment Gait Distance (Feet): 15 Feet (+6) Assistive device: 2 person hand held assist;1 person hand held assist Gait Pattern/deviations: Step-to pattern;Decreased stride length Gait velocity: decreased Gait velocity interpretation: <1.31 ft/sec, indicative of household ambulator General Gait Details: very short, shuffling steps with minimal clearance and minimal stride. no overt LOB, but increased lateral movement.. minA to direct movements, encouragement and directional cues   Modified Rankin (Stroke Patients Only) Modified Rankin (Stroke Patients Only) Pre-Morbid Rankin Score: No symptoms Modified Rankin: Moderately severe disability     Balance Overall balance assessment: Needs assistance Sitting-balance support: No upper extremity supported;Feet supported Sitting balance-Leahy Scale: Good Sitting balance - Comments: Able to don socks   Standing balance support: Single extremity supported Standing balance-Leahy Scale: Poor  Standing balance comment: reliant on UE support                             Pertinent  Vitals/Pain Pain Assessment: Faces Faces Pain Scale: Hurts even more Pain Location: HA; reaching up to touch head and grimacing Pain Descriptors / Indicators: Constant;Discomfort;Grimacing;Headache;Moaning;Guarding Pain Intervention(s): Limited activity within patient's tolerance;Monitored during session;Repositioned    Home Living Family/patient expects to be discharged to:: Private residence Living Arrangements: Spouse/significant other Available Help at Discharge: Family;Available 24 hours/day Type of Home: House Home Access: Stairs to enter Entrance Stairs-Rails: None Entrance Stairs-Number of Steps: 3 Home Layout: One level Home Equipment: None      Prior Function Level of Independence: Independent         Comments: Worked in [employer]     Hand Dominance   Dominant Hand: Right    Extremity/Trunk Assessment   Upper Extremity Assessment Upper Extremity Assessment: Defer to OT evaluation RUE Deficits / Details: Decreased attention to RUE. Poor coorindation and grasp. Unable to follow commands for muscle testing but able to bring hand to mouth to scatch face when not thinking about it. RUE Coordination: decreased fine motor;decreased gross motor    Lower Extremity Assessment Lower Extremity Assessment: Difficult to assess due to impaired cognition;RLE deficits/detail RLE Deficits / Details: pt able to use functionally without knee buckling during gait, pt with difficulty consistently following commands to lift or hold against gravity, but when she does, is able to hold against at least min resistance RLE Sensation:  (pt unable to comment on sensation)    Cervical / Trunk Assessment Cervical / Trunk Assessment: Normal  Communication   Communication: Expressive difficulties;Receptive difficulties  Cognition Arousal/Alertness: Awake/alert Behavior During Therapy: WFL for tasks assessed/performed Overall Cognitive Status: Difficult to assess Area of Impairment: Following  commands;Awareness;Problem solving;Attention                   Current Attention Level: Sustained (short)   Following Commands: Follows one step commands with increased time;Follows one step commands inconsistently   Awareness: Intellectual Problem Solving: Difficulty sequencing;Requires verbal cues;Requires tactile cues;Slow processing General Comments: Pt with decreased attention to R side. During grooming task, pt requiring Max cues to intiate and then attend to comb. Pt with short attention and only engaging in task for ~30-60 seconds. Pt frustrated by communication deficits and aphasia. Able to attend and follow cues with clear direct commands and quiet environment. Becomes over stimulated      General Comments General comments (skin integrity, edema, etc.): HR max 140 with gait and pain (increased HA with mobility), returned to 100s with seated rest    Exercises     Assessment/Plan    PT Assessment Patient needs continued PT services  PT Problem List Decreased strength;Decreased range of motion;Decreased activity tolerance;Decreased balance;Decreased mobility;Decreased coordination;Decreased cognition;Decreased safety awareness       PT Treatment Interventions DME instruction;Gait training;Stair training;Functional mobility training;Therapeutic activities;Therapeutic exercise;Balance training;Neuromuscular re-education;Patient/family education    PT Goals (Current goals can be found in the Care Plan section)  Acute Rehab PT Goals Patient Stated Goal: none stated PT Goal Formulation: With patient Time For Goal Achievement: 05/03/20 Potential to Achieve Goals: Good    Frequency Min 4X/week   Barriers to discharge        Co-evaluation PT/OT/SLP Co-Evaluation/Treatment: Yes Reason for Co-Treatment: For patient/therapist safety;To address functional/ADL transfers PT goals addressed during session: Mobility/safety with mobility;Balance OT goals addressed during  session:  ADL's and self-care       AM-PAC PT "6 Clicks" Mobility  Outcome Measure Help needed turning from your back to your side while in a flat bed without using bedrails?: A Little Help needed moving from lying on your back to sitting on the side of a flat bed without using bedrails?: A Little Help needed moving to and from a bed to a chair (including a wheelchair)?: A Little Help needed standing up from a chair using your arms (e.g., wheelchair or bedside chair)?: A Little Help needed to walk in hospital room?: A Little Help needed climbing 3-5 steps with a railing? : A Lot 6 Click Score: 17    End of Session Equipment Utilized During Treatment: Gait belt Activity Tolerance: Patient tolerated treatment well Patient left: in chair;with family/visitor present Nurse Communication: Mobility status (pt waving to family from window, chair alarm not attached to allow for chair positioned at window) PT Visit Diagnosis: Unsteadiness on feet (R26.81);Other abnormalities of gait and mobility (R26.89);Hemiplegia and hemiparesis Hemiplegia - Right/Left: Right Hemiplegia - dominant/non-dominant: Dominant Hemiplegia - caused by: Cerebral infarction    Time: 0820-0856 PT Time Calculation (min) (ACUTE ONLY): 36 min   Charges:   PT Evaluation $PT Eval Moderate Complexity: 1 Mod          Karma Ganja, PT, DPT   Acute Rehabilitation Department Pager #: [phone]  Otho Bellows 04/19/2020, 10:07 AM

## 2020-04-19 NOTE — Evaluation (Signed)
Clinical/Bedside Swallow Evaluation Patient Details  Name: Tracey [name] MRN: [id number] Date of Birth: 07/28/1955  Today's Date: 04/19/2020 Time: SLP Start Time (ACUTE ONLY): 0901 SLP Stop Time (ACUTE ONLY): 0930 SLP Time Calculation (min) (ACUTE ONLY): 10 min  Past Medical History:  Past Medical History:  Diagnosis Date  . Anxiety   . Bronchitis   . CAD (coronary artery disease)    a. 04/2008: cariac arrest with prox LAD occ s/p stenting, arctic sun. b. 11/2008: restenosis s/p repeat stenting. c. 01/2011: abnormal stress test but cath reportedly OK.  Marland Kitchen Hypothyroid   . Myocardial infarction [facility]) 2010   stents and cardiac rehab    Past Surgical History:  Past Surgical History:  Procedure Laterality Date  . ANGIOPLASTY  2010   2 stents (femoral)   . RADIOLOGY WITH ANESTHESIA N/A 04/18/2020   Procedure: IR WITH ANESTHESIA;  Surgeon: Radiologist, Medication, MD;  Location: [place];  Service: Radiology;  Laterality: N/A;  . TUBAL LIGATION     HPI:  Tracey [name] is a 65 y.o. female with a PMHx of CAD s/p MI, cardiac arrest, HTN, hypothryoidism, HLD, and anxiety. Presented to [facility] ED with slurred speech and and word finding difficulty. CTA head and neck showed a distal M2 occlusion in left MCA branch. Per chart tPA emergently stopped when increased dysarthria noted> emergent head CT showed no intracranial hemorrhage. Underwent revascularization of occluded Lt MCA following left MCA embolism.   Assessment / Plan / Recommendation Clinical Impression  Pt's oral and pharyngeal consumption of applesauce, solid and multiple sips water (close to 3 oz) was timely, subjectively strong and sans overt s/s aspiration. She exhibited one delayed and dry cough, not concerning for aspiration. Therapist ordered a regular texture, thin liquids, pills whole with thin, straws allowed. Reviewed strategies to facilitate safety. Will follow up briefly with recommendations. SLP Visit Diagnosis: Dysphagia,  unspecified (R13.10)    Aspiration Risk  Mild aspiration risk    Diet Recommendation Regular;Thin liquid   Liquid Administration via: Cup;Straw Medication Administration: Whole meds with liquid Supervision: Patient able to self feed;Intermittent supervision to cue for compensatory strategies Compensations: Slow rate;Small sips/bites Postural Changes: Seated upright at 90 degrees    Other  Recommendations Oral Care Recommendations: Oral care BID   Follow up Recommendations Inpatient Rehab      Frequency and Duration min 2x/week  2 weeks       Prognosis Prognosis for Safe Diet Advancement: Good      Swallow Study   General Date of Onset: 04/18/20 HPI: Tracey [name] is a 65 y.o. female with a PMHx of CAD s/p MI, cardiac arrest, HTN, hypothryoidism, HLD, and anxiety. Presented to [facility] ED with slurred speech and and word finding difficulty. CTA head and neck showed a distal M2 occlusion in left MCA branch. Per chart tPA emergently stopped when increased dysarthria noted> emergent head CT showed no intracranial hemorrhage. Underwent revascularization of occluded Lt MCA following left MCA embolism. Type of Study: Bedside Swallow Evaluation Previous Swallow Assessment:  (none) Diet Prior to this Study: NPO Temperature Spikes Noted: No Respiratory Status: Room air History of Recent Intubation: Yes Length of Intubations (days):  (during procedure) Date extubated: 04/18/20 Behavior/Cognition: Alert;Cooperative;Pleasant mood;Requires cueing Oral Cavity Assessment: Within Functional Limits Oral Care Completed by SLP: No Oral Cavity - Dentition: Adequate natural dentition Vision:  (suspect impairment) Self-Feeding Abilities: Able to feed self Patient Positioning: Upright in bed;Upright in chair Baseline Vocal Quality: Normal Volitional Cough: Other (Comment) (unable due to  apraxia) Volitional Swallow:  (apraxia- was unable)    Oral/Motor/Sensory Function Overall Oral  Motor/Sensory Function: Within functional limits   Ice Chips Ice chips: Not tested   Thin Liquid Thin Liquid: Within functional limits Presentation: Cup;Straw    Nectar Thick Nectar Thick Liquid: Not tested   Honey Thick Honey Thick Liquid: Not tested   Puree Puree: Within functional limits   Solid     Solid: Within functional limits      Houston Siren 04/19/2020,9:53 AM  Orbie Pyo Colvin Caroli.Ed Risk analyst [phone] Office [phone]

## 2020-04-19 NOTE — Consult Note (Signed)
Physical Medicine and Rehabilitation Consult Reason for Consult: Expressive aphasia and right side weakness Referring Physician: Dr.Xu   HPI: Tracey [name] is a 65 y.o. right-handed female with history of CAD and stenting, ischemic cardiomyopathy followed at [facility] maintained on aspirin., hypothyroidism, remote tobacco abuse, hypothyroidism.  Per chart review patient lives with spouse.  Independent prior to admission.  Presented 04/18/2020 with aphasia and right-sided weakness.  Blood pressure 140s over 60s..  Cranial CT scan negative for acute changes.  Patient did receive TPA.  CT angiogram head and neck showed occluded distal M2 left MCA branch vessel with mild stenotic calcified plaque within the intracranial ICAs bilaterally.  Echocardiogram with ejection fraction of 45 to 50%.  Patient did undergo revascularization per interventional radiology.  MRI/MRA pending.  Admission chemistries unremarkable except glucose 111, urine drug screen negative.  Cleviprex added for blood pressure control.  Neurology follow-up work-up currently ongoing.  Therapy evaluations completed with recommendations of physical medicine rehab consult.   Review of Systems  Constitutional: Negative for chills and fever.  HENT: Negative for hearing loss.   Eyes: Negative for blurred vision and double vision.  Respiratory: Negative for cough and shortness of breath.   Cardiovascular: Negative for chest pain, palpitations and leg swelling.  Gastrointestinal: Positive for constipation. Negative for heartburn, nausea and vomiting.  Genitourinary: Negative for dysuria, flank pain and hematuria.  Musculoskeletal: Positive for joint pain and myalgias.  Skin: Negative for rash.  Neurological: Positive for speech change and weakness.  Psychiatric/Behavioral:       Anxiety  All other systems reviewed and are negative.  Past Medical History:  Diagnosis Date  . Anxiety   . Bronchitis   . CAD (coronary  artery disease)    a. 04/2008: cariac arrest with prox LAD occ s/p stenting, arctic sun. b. 11/2008: restenosis s/p repeat stenting. c. 01/2011: abnormal stress test but cath reportedly OK.  Marland Kitchen Hypothyroid   . Myocardial infarction [facility]) 2010   stents and cardiac rehab    Past Surgical History:  Procedure Laterality Date  . ANGIOPLASTY  2010   2 stents (femoral)   . RADIOLOGY WITH ANESTHESIA N/A 04/18/2020   Procedure: IR WITH ANESTHESIA;  Surgeon: Radiologist, Medication, MD;  Location: [place];  Service: Radiology;  Laterality: N/A;  . TUBAL LIGATION     Family History  Problem Relation Age of Onset  . Diabetes Mother   . Hypertension Mother   . Stroke Maternal Grandmother   . Diabetes Maternal Grandfather   . Stroke Maternal Grandfather    Social History:  reports that she quit smoking about 11 years ago. Her smoking use included cigarettes. She started smoking about 47 years ago. She smoked 1.50 packs per day. She has never used smokeless tobacco. She reports that she does not drink alcohol and does not use drugs. Allergies:  Allergies  Allergen Reactions  . Sulfa Antibiotics Hives   Medications Prior to Admission  Medication Sig Dispense Refill  . aspirin EC 81 MG tablet Take 81 mg by mouth daily.    . Cholecalciferol 50 MCG (2000 UT) TABS Take 2,000 Units by mouth daily.    . citalopram (CELEXA) 20 MG tablet Take 20 mg by mouth daily.    . cyanocobalamin (,VITAMIN B-12,) 1000 MCG/ML injection Inject 1,000 mcg into the muscle every 30 (thirty) days.    Marland Kitchen levothyroxine (SYNTHROID) 75 MCG tablet Take 75 mcg by mouth daily.    . metoprolol succinate (TOPROL-XL) 25  MG 24 hr tablet Take 25 mg by mouth daily.    . nitroGLYCERIN (NITROSTAT) 0.4 MG SL tablet Place 0.4 mg under the tongue every 5 (five) minutes as needed for chest pain.    . pantoprazole (PROTONIX) 40 MG tablet Take 40 mg by mouth daily.    . ramipril (ALTACE) 5 MG capsule Take 5 mg by mouth daily.    . rosuvastatin  (CRESTOR) 40 MG tablet Take 40 mg by mouth daily.      Home: Home Living Family/patient expects to be discharged to:: Private residence Living Arrangements: Spouse/significant other Available Help at Discharge: Family,Available 24 hours/day Type of Home: House Home Access: Stairs to enter [employer] of Steps: 3 Entrance Stairs-Rails: None Home Layout: One level Bathroom Shower/Tub: Multimedia programmer: [place]: None  Lives With: Spouse  Functional History: Prior Function Level of Independence: Independent Comments: Worked in [place] Status:  Mobility: Bed Mobility Overal bed mobility: Needs Assistance Bed Mobility: Supine to Sit Supine to sit: Mod assist,+2 for physical assistance,HOB elevated General bed mobility comments: Mod A to bring BELs towards EOB and initate task. Pt then able to bring trunk up with Min support. +2 for safety Transfers Overall transfer level: Needs assistance Equipment used: 2 person hand held assist Transfers: Sit to/from Stand Sit to Stand: Min assist,+2 physical assistance,+2 safety/equipment General transfer comment: MIn A for power up and gaining balance, progressed from minA of 2 to minA of 1 within session with +2 for line management Ambulation/Gait Ambulation/Gait assistance: Min assist,+2 safety/equipment Gait Distance (Feet): 15 Feet (+6) Assistive device: 2 person hand held assist,1 person hand held assist Gait Pattern/deviations: Step-to pattern,Decreased stride length General Gait Details: very short, shuffling steps with minimal clearance and minimal stride. no overt LOB, but increased lateral movement.. minA to direct movements, encouragement and directional cues Gait velocity: decreased Gait velocity interpretation: <1.31 ft/sec, indicative of household ambulator    ADL: ADL Overall ADL's : Needs assistance/impaired Eating/Feeding: Minimal assistance,Bed level,Sitting Grooming:  Brushing hair,Minimal assistance,Standing,Maximal assistance,Cueing for sequencing Grooming Details (indicate cue type and reason): Pt requiring Min A for balance due to R inattention and slight lean. Pt requiring Max cues to initate and attend to brushing her hair. Pt initially wiping her hair with a paper towl and requiring max cues for transision.  Once iniated, pt able to brush her hair with comb; short attention. Upper Body Bathing: Minimal assistance,Sitting Lower Body Bathing: Moderate assistance,Sit to/from stand Upper Body Dressing : Minimal assistance,Sitting Lower Body Dressing: Moderate assistance,Sit to/from stand Lower Body Dressing Details (indicate cue type and reason): Pt able to don socks with Min A for initating task. once socks pulled over toes, pt able to then complete task using figure four method. Mod A for dynamic balance in standing Toilet Transfer: Minimal assistance,+2 for safety/equipment,Ambulation,Regular Toilet Toileting- Clothing Manipulation and Hygiene: Moderate assistance,Sit to/from stand Functional mobility during ADLs: Moderate assistance,+2 for safety/equipment General ADL Comments: Pt presenting with decreased balance, strength, attention to  R side, and cognitive deficits.  Cognition: Cognition Overall Cognitive Status: Difficult to assess Arousal/Alertness: Awake/alert Orientation Level: Oriented to person,Oriented to place (via yes/no) Attention: Sustained Sustained Attention: Impaired Memory:  (TBA) Awareness: Impaired Awareness Impairment: Anticipatory impairment Problem Solving:  (TBA) Safety/Judgment: Impaired Cognition Arousal/Alertness: Awake/alert Behavior During Therapy: WFL for tasks assessed/performed Overall Cognitive Status: Difficult to assess Area of Impairment: Following commands,Awareness,Problem solving,Attention Current Attention Level: Sustained (short) Following Commands: Follows one step commands with increased time,Follows  one step commands inconsistently  Awareness: Intellectual Problem Solving: Difficulty sequencing,Requires verbal cues,Requires tactile cues,Slow processing General Comments: Pt with decreased attention to R side. During grooming task, pt requiring Max cues to intiate and then attend to comb. Pt with short attention and only engaging in task for ~30-60 seconds. Pt frustrated by communication deficits and aphasia. Able to attend and follow cues with clear direct commands and quiet environment. Becomes over stimulated Difficult to assess due to: Impaired communication  Blood pressure (!) 109/57, pulse 94, temperature 98.5 F (36.9 C), temperature source Oral, resp. rate (!) 23, SpO2 99 %. Physical Exam Constitutional:      Appearance: Normal appearance.  HENT:     Head: Normocephalic and atraumatic.     Nose: Nose normal.  Eyes:     Extraocular Movements: Extraocular movements intact.     Conjunctiva/sclera: Conjunctivae normal.     Pupils: Pupils are equal, round, and reactive to light.  Cardiovascular:     Rate and Rhythm: Normal rate and regular rhythm.     Pulses: Normal pulses.  Pulmonary:     Effort: Pulmonary effort is normal.  Abdominal:     Palpations: Abdomen is soft.  Musculoskeletal:        General: Normal range of motion.     Cervical back: Normal range of motion.  Skin:    General: Skin is warm.     Comments: Multiple ecchymoses on arms and legs  Neurological:     Mental Status: She is alert.     Comments: Alert. Makes good eye contact. Exp>receptive aphasia with apraxia. Mild Right pronator drift. Strength grossly 4/5 RUE and 5/5 LUE. Difficult to MMT in LE as she did not consistently participate but she moved both limbs. Pt with motor apraxia. Senses pain in all 4's, but left more than right. DTR's 1+  Psychiatric:     Comments: Emotional, tearful at times but overall very pleasant     Results for orders placed or performed during the hospital encounter of 04/18/20  (from the past 24 hour(s))  MRSA PCR Screening     Status: None   Collection Time: 04/18/20  3:21 PM   Specimen: Nasal Mucosa; Nasopharyngeal  Result Value Ref Range   MRSA by PCR NEGATIVE NEGATIVE  Hemoglobin A1c     Status: None   Collection Time: 04/19/20  5:00 AM  Result Value Ref Range   Hgb A1c MFr Bld 5.4 4.8 - 5.6 %   Mean Plasma Glucose 108.28 mg/dL  Lipid panel     Status: None   Collection Time: 04/19/20  5:00 AM  Result Value Ref Range   Cholesterol 135 0 - 200 mg/dL   Triglycerides 55 <150 mg/dL   HDL 73 >40 mg/dL   Total CHOL/HDL Ratio 1.8 RATIO   VLDL 11 0 - 40 mg/dL   LDL Cholesterol 51 0 - 99 mg/dL  Comprehensive metabolic panel     Status: Abnormal   Collection Time: 04/19/20  5:00 AM  Result Value Ref Range   Sodium 140 135 - 145 mmol/L   Potassium 3.5 3.5 - 5.1 mmol/L   Chloride 111 98 - 111 mmol/L   CO2 23 22 - 32 mmol/L   Glucose, Bld 128 (H) 70 - 99 mg/dL   BUN 12 8 - 23 mg/dL   Creatinine, Ser 0.80 0.44 - 1.00 mg/dL   Calcium 8.5 (L) 8.9 - 10.3 mg/dL   Total Protein 6.0 (L) 6.5 - 8.1 g/dL   Albumin 3.1 (L) 3.5 - 5.0 g/dL  AST 20 15 - 41 U/L   ALT 16 0 - 44 U/L   Alkaline Phosphatase 46 38 - 126 U/L   Total Bilirubin 0.7 0.3 - 1.2 mg/dL   GFR, Estimated >60 >60 mL/min   Anion gap 6 5 - 15  TSH     Status: None   Collection Time: 04/19/20  5:00 AM  Result Value Ref Range   TSH 0.425 0.350 - 4.500 uIU/mL  CBC with Differential/Platelet     Status: Abnormal   Collection Time: 04/19/20  5:00 AM  Result Value Ref Range   WBC 9.3 4.0 - 10.5 K/uL   RBC 4.62 3.87 - 5.11 MIL/uL   Hemoglobin 13.0 12.0 - 15.0 g/dL   HCT 39.9 36.0 - 46.0 %   MCV 86.4 80.0 - 100.0 fL   MCH 28.1 26.0 - 34.0 pg   MCHC 32.6 30.0 - 36.0 g/dL   RDW 15.4 11.5 - 15.5 %   Platelets 238 150 - 400 K/uL   nRBC 0.0 0.0 - 0.2 %   Neutrophils Relative % 90 %   Neutro Abs 8.3 (H) 1.7 - 7.7 K/uL   Lymphocytes Relative 6 %   Lymphs Abs 0.6 (L) 0.7 - 4.0 K/uL   Monocytes Relative  4 %   Monocytes Absolute 0.4 0.1 - 1.0 K/uL   Eosinophils Relative 0 %   Eosinophils Absolute 0.0 0.0 - 0.5 K/uL   Basophils Relative 0 %   Basophils Absolute 0.0 0.0 - 0.1 K/uL   Immature Granulocytes 0 %   Abs Immature Granulocytes 0.02 0.00 - 0.07 K/uL   CT ANGIO HEAD W OR WO CONTRAST  Result Date: 04/18/2020 CLINICAL DATA:  Neuro deficit, acute, stroke suspected. Additional history provided: Speech difficulty, right facial numbness. EXAM: CT ANGIOGRAPHY HEAD AND NECK CT PERFUSION BRAIN TECHNIQUE: Multidetector CT imaging of the head and neck was performed using the standard protocol during bolus administration of intravenous contrast. Multiplanar CT image reconstructions and MIPs were obtained to evaluate the vascular anatomy. Carotid stenosis measurements (when applicable) are obtained utilizing NASCET criteria, using the distal internal carotid diameter as the denominator. Multiphase CT imaging of the brain was performed following IV bolus contrast injection. Subsequent parametric perfusion maps were calculated using RAPID software. CONTRAST:  15mL OMNIPAQUE IOHEXOL 350 MG/ML SOLN COMPARISON:  Report from brain MRI 12/15/1998 (images unavailable). FINDINGS: CTA NECK FINDINGS Aortic arch: Standard aortic branching. The visualized aortic arch is normal in caliber. Atherosclerotic plaque within the visualized aortic arch and proximal major branch vessels of the neck. No hemodynamically significant innominate or proximal subclavian artery stenosis. Right carotid system: CCA and ICA patent within the neck without stenosis or significant atherosclerotic disease. Calcified plaque within the proximal ECA. Left carotid system: CCA and ICA patent within the neck without stenosis stenosis. Trace calcified plaque within the carotid bifurcation. Vertebral arteries: Codominant and patent within the neck without stenosis. Skeleton: Cervical spondylosis with multilevel disc space narrowing, disc bulges,  uncovertebral hypertrophy. No acute bony abnormality or aggressive osseous lesion. Other neck: No neck mass or cervical lymphadenopathy. Upper chest: No consolidation within the imaged lung apices. Review of the MIP images confirms the above findings CTA HEAD FINDINGS Anterior circulation: The intracranial internal carotid arteries are patent. Mild calcified plaque within both vessels without stenosis. The M1 middle cerebral arteries are patent. There is an occluded distal M2 left MCA branch vessel (series 10, image 52) (series 12, image 27). The anterior cerebral arteries are patent. No intracranial aneurysm is identified.  Posterior circulation: The intracranial vertebral arteries are patent. The basilar artery is patent. The posterior cerebral arteries are patent. Posterior communicating arteries are hypoplastic or absent bilaterally. Venous sinuses: Within the limitations of contrast timing, no convincing thrombus. Anatomic variants: As described Review of the MIP images confirms the above findings CT Brain Perfusion Findings: CBF (<30%) Volume: 46mL Perfusion (Tmax>6.0s) volume: 71mL (posterior left MCA vascular territory) Mismatch Volume: 15mL Infarction Location:None identified These results were called by telephone at the time of interpretation on 04/18/2020 at 10:47 am to provider [facility] , who verbally acknowledged these results. IMPRESSION: CTA neck: The common carotid, internal carotid and vertebral arteries are patent within the neck without hemodynamically significant stenosis. CTA head: 1. Occluded distal M2 left MCA branch vessel. 2. Mild non-stenotic calcified plaque within the intracranial ICAs bilaterally. CT perfusion head: The perfusion software identifies a 16 mL region of hypoperfusion within the posterior left MCA territory utilizing the Tmax>6 seconds threshold. The perfusion software identifies no core infarct. Mismatch volume: 16 mL. Electronically Signed   By: Kellie Simmering DO   On:  04/18/2020 10:59   CT Head Wo Contrast  Result Date: 04/18/2020 CLINICAL DATA:  Provided history: Status post tPA, worsening symptoms. EXAM: CT HEAD WITHOUT CONTRAST TECHNIQUE: Contiguous axial images were obtained from the base of the skull through the vertex without intravenous contrast. COMPARISON:  Noncontrast head CT, CT angiogram head/neck and CT perfusion FINDINGS: Brain: Cerebral volume is normal for age. Redemonstrated small amount of ill-defined hypoattenuation within the posterior left frontal lobe head in the setting of an acute distal left M2 MCA branch vessel occlusion, this may reflect a small acute white matter infarct or chronic small vessel ischemic disease. Stable background mild cerebral white matter chronic small vessel ischemic disease. There is no acute intracranial hemorrhage. No demarcated cortical infarct. No extra-axial fluid collection. No evidence of intracranial mass. No midline shift. Vascular: Residual circulating intravascular contrast limits evaluation for hyperdense vessels. Skull: No calvarial fracture. Sinuses/Orbits: Visualized orbits show no acute finding. Tiny mucous retention cyst and small volume frothy secretions within the right sphenoid sinus. These results were called by telephone at the time of interpretation on 04/18/2020 at 12:14 pm to provider Dr. Curly Sh[place], who verbally acknowledged these results. IMPRESSION: No evidence of acute intracranial hemorrhage status post tPA. No demarcated cortical infarct. As before, there is subtle hypodensity within the posterior left frontal lobe white matter. In the setting of an acute distal left M2 MCA branch occlusion, this may reflect a small acute white matter infarct or chronic small vessel ischemic disease. Stable background mild cerebral white matter chronic small vessel ischemic disease. Right sphenoid sinusitis. Electronically Signed   By: Kellie Simmering DO   On: 04/18/2020 12:26   CT ANGIO NECK W OR WO CONTRAST  Result  Date: 04/18/2020 CLINICAL DATA:  Neuro deficit, acute, stroke suspected. Additional history provided: Speech difficulty, right facial numbness. EXAM: CT ANGIOGRAPHY HEAD AND NECK CT PERFUSION BRAIN TECHNIQUE: Multidetector CT imaging of the head and neck was performed using the standard protocol during bolus administration of intravenous contrast. Multiplanar CT image reconstructions and MIPs were obtained to evaluate the vascular anatomy. Carotid stenosis measurements (when applicable) are obtained utilizing NASCET criteria, using the distal internal carotid diameter as the denominator. Multiphase CT imaging of the brain was performed following IV bolus contrast injection. Subsequent parametric perfusion maps were calculated using RAPID software. CONTRAST:  147mL OMNIPAQUE IOHEXOL 350 MG/ML SOLN COMPARISON:  Report from brain MRI 12/15/1998 (images unavailable).  FINDINGS: CTA NECK FINDINGS Aortic arch: Standard aortic branching. The visualized aortic arch is normal in caliber. Atherosclerotic plaque within the visualized aortic arch and proximal major branch vessels of the neck. No hemodynamically significant innominate or proximal subclavian artery stenosis. Right carotid system: CCA and ICA patent within the neck without stenosis or significant atherosclerotic disease. Calcified plaque within the proximal ECA. Left carotid system: CCA and ICA patent within the neck without stenosis stenosis. Trace calcified plaque within the carotid bifurcation. Vertebral arteries: Codominant and patent within the neck without stenosis. Skeleton: Cervical spondylosis with multilevel disc space narrowing, disc bulges, uncovertebral hypertrophy. No acute bony abnormality or aggressive osseous lesion. Other neck: No neck mass or cervical lymphadenopathy. Upper chest: No consolidation within the imaged lung apices. Review of the MIP images confirms the above findings CTA HEAD FINDINGS Anterior circulation: The intracranial internal  carotid arteries are patent. Mild calcified plaque within both vessels without stenosis. The M1 middle cerebral arteries are patent. There is an occluded distal M2 left MCA branch vessel (series 10, image 52) (series 12, image 27). The anterior cerebral arteries are patent. No intracranial aneurysm is identified. Posterior circulation: The intracranial vertebral arteries are patent. The basilar artery is patent. The posterior cerebral arteries are patent. Posterior communicating arteries are hypoplastic or absent bilaterally. Venous sinuses: Within the limitations of contrast timing, no convincing thrombus. Anatomic variants: As described Review of the MIP images confirms the above findings CT Brain Perfusion Findings: CBF (<30%) Volume: 26mL Perfusion (Tmax>6.0s) volume: 41mL (posterior left MCA vascular territory) Mismatch Volume: 57mL Infarction Location:None identified These results were called by telephone at the time of interpretation on 04/18/2020 at 10:47 am to provider [facility] , who verbally acknowledged these results. IMPRESSION: CTA neck: The common carotid, internal carotid and vertebral arteries are patent within the neck without hemodynamically significant stenosis. CTA head: 1. Occluded distal M2 left MCA branch vessel. 2. Mild non-stenotic calcified plaque within the intracranial ICAs bilaterally. CT perfusion head: The perfusion software identifies a 16 mL region of hypoperfusion within the posterior left MCA territory utilizing the Tmax>6 seconds threshold. The perfusion software identifies no core infarct. Mismatch volume: 16 mL. Electronically Signed   By: Kellie Simmering DO   On: 04/18/2020 10:59   CT CEREBRAL PERFUSION W CONTRAST  Result Date: 04/18/2020 CLINICAL DATA:  Neuro deficit, acute, stroke suspected. Additional history provided: Speech difficulty, right facial numbness. EXAM: CT ANGIOGRAPHY HEAD AND NECK CT PERFUSION BRAIN TECHNIQUE: Multidetector CT imaging of the head and neck  was performed using the standard protocol during bolus administration of intravenous contrast. Multiplanar CT image reconstructions and MIPs were obtained to evaluate the vascular anatomy. Carotid stenosis measurements (when applicable) are obtained utilizing NASCET criteria, using the distal internal carotid diameter as the denominator. Multiphase CT imaging of the brain was performed following IV bolus contrast injection. Subsequent parametric perfusion maps were calculated using RAPID software. CONTRAST:  178mL OMNIPAQUE IOHEXOL 350 MG/ML SOLN COMPARISON:  Report from brain MRI 12/15/1998 (images unavailable). FINDINGS: CTA NECK FINDINGS Aortic arch: Standard aortic branching. The visualized aortic arch is normal in caliber. Atherosclerotic plaque within the visualized aortic arch and proximal major branch vessels of the neck. No hemodynamically significant innominate or proximal subclavian artery stenosis. Right carotid system: CCA and ICA patent within the neck without stenosis or significant atherosclerotic disease. Calcified plaque within the proximal ECA. Left carotid system: CCA and ICA patent within the neck without stenosis stenosis. Trace calcified plaque within the carotid bifurcation. Vertebral arteries: Codominant  and patent within the neck without stenosis. Skeleton: Cervical spondylosis with multilevel disc space narrowing, disc bulges, uncovertebral hypertrophy. No acute bony abnormality or aggressive osseous lesion. Other neck: No neck mass or cervical lymphadenopathy. Upper chest: No consolidation within the imaged lung apices. Review of the MIP images confirms the above findings CTA HEAD FINDINGS Anterior circulation: The intracranial internal carotid arteries are patent. Mild calcified plaque within both vessels without stenosis. The M1 middle cerebral arteries are patent. There is an occluded distal M2 left MCA branch vessel (series 10, image 52) (series 12, image 27). The anterior cerebral  arteries are patent. No intracranial aneurysm is identified. Posterior circulation: The intracranial vertebral arteries are patent. The basilar artery is patent. The posterior cerebral arteries are patent. Posterior communicating arteries are hypoplastic or absent bilaterally. Venous sinuses: Within the limitations of contrast timing, no convincing thrombus. Anatomic variants: As described Review of the MIP images confirms the above findings CT Brain Perfusion Findings: CBF (<30%) Volume: 27mL Perfusion (Tmax>6.0s) volume: 62mL (posterior left MCA vascular territory) Mismatch Volume: 51mL Infarction Location:None identified These results were called by telephone at the time of interpretation on 04/18/2020 at 10:47 am to provider [facility] , who verbally acknowledged these results. IMPRESSION: CTA neck: The common carotid, internal carotid and vertebral arteries are patent within the neck without hemodynamically significant stenosis. CTA head: 1. Occluded distal M2 left MCA branch vessel. 2. Mild non-stenotic calcified plaque within the intracranial ICAs bilaterally. CT perfusion head: The perfusion software identifies a 16 mL region of hypoperfusion within the posterior left MCA territory utilizing the Tmax>6 seconds threshold. The perfusion software identifies no core infarct. Mismatch volume: 16 mL. Electronically Signed   By: Kellie Simmering DO   On: 04/18/2020 10:59   ECHOCARDIOGRAM COMPLETE  Result Date: 04/18/2020    ECHOCARDIOGRAM REPORT   Patient Name:   MAYRA JOLLIFFE Kohli Date of Exam: 04/18/2020 Medical Rec #:  [id number]         Height:       65.0 in Accession #:    [id number]        Weight:       217.1 lb Date of Birth:  08/26/1955         BSA:          2.048 m Patient Age:    4 years          BP:           130/97 mmHg Patient Gender: F                 HR:           93 bpm. Exam Location:  Inpatient Procedure: 2D Echo, Cardiac Doppler and Color Doppler Indications:    Stroke  History:        Patient  has prior history of Echocardiogram examinations. CAD;                 Risk Factors:Dyslipidemia and Former Smoker. GERD.  Sonographer:    Clayton Lefort RDCS (AE) Referring Phys: [id number] Karsten Fells [facility]  Sonographer Comments: No subcostal window and patient is morbidly obese. Image acquisition challenging due to patient body habitus. Unable to adjust patient position. IMPRESSIONS  1. Left ventricular ejection fraction, by estimation, is 45 to 50%. The left ventricle has mildly decreased function. The left ventricle demonstrates regional wall motion abnormalities (see scoring diagram/findings for description). Mid to apical anteroseptal akinesis. Left ventricular diastolic parameters are indeterminate.  2. Right ventricular systolic  function is normal. The right ventricular size is normal. Tricuspid regurgitation signal is inadequate for assessing PA pressure.  3. The mitral valve is normal in structure. No evidence of mitral valve regurgitation.  4. The aortic valve was not well visualized. Aortic valve regurgitation is not visualized. No aortic stenosis is present. FINDINGS  Left Ventricle: Left ventricular ejection fraction, by estimation, is 45 to 50%. The left ventricle has mildly decreased function. The left ventricle demonstrates regional wall motion abnormalities. The left ventricular internal cavity size was normal in size. There is no left ventricular hypertrophy. Left ventricular diastolic parameters are indeterminate.  LV Wall Scoring: The mid and distal anterior septum is akinetic. The apex is hypokinetic. The entire anterior wall, entire lateral wall, entire inferior wall, basal anteroseptal segment, mid inferoseptal segment, and basal inferoseptal segment are normal. Right Ventricle: The right ventricular size is normal. No increase in right ventricular wall thickness. Right ventricular systolic function is normal. Tricuspid regurgitation signal is inadequate for assessing PA pressure. Left Atrium: Left  atrial size was normal in size. Right Atrium: Right atrial size was normal in size. Pericardium: Trivial pericardial effusion is present. Mitral Valve: The mitral valve is normal in structure. No evidence of mitral valve regurgitation. Tricuspid Valve: The tricuspid valve is normal in structure. Tricuspid valve regurgitation is not demonstrated. Aortic Valve: The aortic valve was not well visualized. Aortic valve regurgitation is not visualized. No aortic stenosis is present. Aortic valve mean gradient measures 5.0 mmHg. Aortic valve peak gradient measures 9.7 mmHg. Aortic valve area, by VTI measures 1.80 cm. Pulmonic Valve: The pulmonic valve was not well visualized. Pulmonic valve regurgitation is not visualized. Aorta: The aortic root and ascending aorta are structurally normal, with no evidence of dilitation. IAS/Shunts: The interatrial septum was not well visualized.  LEFT VENTRICLE PLAX 2D LVIDd:         4.50 cm  Diastology LVIDs:         3.10 cm  LV e' medial:  8.59 cm/s LV PW:         0.90 cm  LV e' lateral: 10.40 cm/s LV IVS:        1.00 cm LVOT diam:     2.00 cm LV SV:         62 LV SV Index:   30 LVOT Area:     3.14 cm  RIGHT VENTRICLE RV Basal diam:  3.00 cm RV S prime:     12.30 cm/s TAPSE (M-mode): 2.6 cm LEFT ATRIUM             Index       RIGHT ATRIUM           Index LA diam:        3.40 cm 1.66 cm/m  RA Area:     14.60 cm LA Vol (A2C):   39.0 ml 19.04 ml/m RA Volume:   35.00 ml  17.09 ml/m LA Vol (A4C):   29.3 ml 14.30 ml/m LA Biplane Vol: 36.4 ml 17.77 ml/m  AORTIC VALVE AV Area (Vmax):    1.91 cm AV Area (Vmean):   1.93 cm AV Area (VTI):     1.80 cm AV Vmax:           156.00 cm/s AV Vmean:          110.000 cm/s AV VTI:            0.343 m AV Peak Grad:      9.7 mmHg AV Mean Grad:  5.0 mmHg LVOT Vmax:         94.60 cm/s LVOT Vmean:        67.500 cm/s LVOT VTI:          0.197 m LVOT/AV VTI ratio: 0.57  AORTA Ao Asc diam: 3.00 cm  SHUNTS Systemic VTI:  0.20 m Systemic Diam: 2.00 cm  Oswaldo Milian MD Electronically signed by Oswaldo Milian MD Signature Date/Time: 04/18/2020/8:29:19 PM    Final    CT HEAD CODE STROKE WO CONTRAST  Addendum Date: 04/18/2020   ADDENDUM REPORT: 04/18/2020 11:12 ADDENDUM: Small amount of white matter hypodensity within the posterior left frontal lobe. In the setting of an acute distal left M2 MCA branch occlusion, this may reflect a small acute white matter infarct or chronic small vessel ischemic disease. These results were called by telephone at the time of interpretation on 04/18/2020 at 11:11 am to provider Dr. Curly Shores, who verbally acknowledged these results. Electronically Signed   By: Kellie Simmering DO   On: 04/18/2020 11:12   Addendum Date: 04/18/2020   ADDENDUM REPORT: 04/18/2020 10:30 ADDENDUM: These results were called by telephone at the time of interpretation on 04/18/2020 at 10:28 am to provider [facility] , who verbally acknowledged these results. Electronically Signed   By: Kellie Simmering DO   On: 04/18/2020 10:30   Result Date: 04/18/2020 CLINICAL DATA:  Code stroke.  Slurred speech. EXAM: CT HEAD WITHOUT CONTRAST TECHNIQUE: Contiguous axial images were obtained from the base of the skull through the vertex without intravenous contrast. COMPARISON:  Report from brain MRI 12/15/1998 (images unavailable). FINDINGS: Brain: Cerebral volume is normal for age. Mild patchy and ill-defined hypoattenuation within the cerebral white matter is nonspecific, but compatible chronic small vessel ischemic disease. There is no acute intracranial hemorrhage. No demarcated cortical infarct. No extra-axial fluid collection. No evidence of intracranial mass. No midline shift. Partially empty sella turcica. Vascular: No hyperdense vessel.  Atherosclerotic calcifications Skull: Normal. Negative for fracture or focal lesion. Sinuses/Orbits: Visualized orbits show no acute finding. Small volume frothy secretions within the right sphenoid sinus. ASPECTS [facility]  Stroke Program Early CT Score) - Ganglionic level infarction (caudate, lentiform nuclei, internal capsule, insula, M1-M3 cortex): 7 - Supraganglionic infarction (M4-M6 cortex): 3 Total score (0-10 with 10 being normal): 10 IMPRESSION: No evidence of acute intracranial abnormality.  ASPECTS is 10. Mild cerebral white matter chronic small vessel ischemic disease. Right sphenoid sinusitis. 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 Does the need for close, 24 hr/day medical supervision in concert with the patient's rehab needs make it unreasonable for this patient to be served in a less intensive setting? Yes and Potentially 2. Co-Morbidities requiring supervision/potential complications: CAD, CM, hypothyroid 3. Due to bladder management, bowel management, safety, skin/wound care, disease management, medication administration, pain management and patient education, does the patient require 24 hr/day rehab nursing? Yes 4. Does the patient require coordinated care of a physician, rehab nurse, therapy disciplines of PT, OT, SLP to address physical and functional deficits in the context of the above medical diagnosis(es)? Yes and Potentially Addressing deficits in the following areas: balance, endurance, locomotion, strength, transferring, bowel/bladder control, bathing, dressing, feeding, grooming, toileting, cognition, language and psychosocial support 5. Can the patient actively participate in an intensive therapy program of at least 3 hrs of therapy per day at least 5 days per week? Yes 6. The  potential for patient to make measurable gains while on inpatient rehab is excellent 7. Anticipated functional outcomes upon discharge from inpatient rehab are supervision  with PT, supervision with OT, supervision and min assist with SLP. 8. Estimated rehab length of  stay to reach the above functional goals is: 7-10 days 9. Anticipated discharge destination: Home 10. Overall Rehab/Functional Prognosis: excellent  RECOMMENDATIONS: This patient's condition is appropriate for continued rehabilitative care in the following setting: CIR Patient has agreed to participate in recommended program. Potentially Note that insurance prior authorization may be required for reimbursement for recommended care.  Comment: MRI/MRA, further work up still pending. Rehab Admissions Coordinator to follow up.  Thanks,  Meredith Staggers, MD, Mellody Drown  I have personally performed a face to face diagnostic evaluation of this patient. Additionally, I have examined pertinent labs and radiographic images. I have reviewed and concur with the physician assistant's documentation above.    Lavon Paganini Angiulli, PA-C 04/19/2020

## 2020-04-19 NOTE — Progress Notes (Signed)
Occupational Therapy Evaluation Note  PTA, pt was living with her husband and was independent and working. Pt currently requiring Min-Mod A for UB ADLs, Mod A for LB ADLs, and Min-Mod A +2 for functional mobility. Pt also requiring Max cues to initiate ADLs and attend to tasks. Pt presenting with aphasia and poor cognition, balance, attention to R side, and strength. Pt presenting with high motivation to participate in therapy and return to PLOF. Pt with supportive family. Pt will require further acute OT to facilitate safe dc. Due to pt's PLOF, age, and high motivation, highly recommend dc to CIR for intensive OT to optimize safety, independence with ADLs, and return to PLOF.     04/19/20 0900  OT Visit Information  Last OT Received On 04/19/20  Assistance Needed +2 (Line management and safety)  PT/OT/SLP Co-Evaluation/Treatment Yes  Reason for Co-Treatment To address functional/ADL transfers;For patient/therapist safety  OT goals addressed during session ADL's and self-care  History of Present Illness 65 yo female presenting 3/23 with slurred speech and expressive aphasia. Imaging revealed L distal MCA occlusion, and pt is now s/p partial revascularization on 3/23. PMH includes: HLD, PAD, GERD, MI (s/p stent placement and cardiac rehab, followed by [facility]), CAD.  Precautions  Precautions Fall  Home Living  Family/patient expects to be discharged to: Private residence  Living Arrangements Spouse/significant other  Available Help at Discharge Family;Available 24 hours/day  Type of Home House  Home Access Stairs to enter  Entrance Stairs-Number of Steps 3  Entrance Stairs-Rails None  Home Layout One level  Barrister's clerk None  Prior Function  Level of Independence Independent  Comments Worked in [employer]  Communication  Communication Expressive difficulties;Receptive difficulties  Pain Assessment  Pain Assessment Faces  Faces  Pain Scale 6  Pain Location HA; reaching up to touch head and grimacing  Pain Descriptors / Indicators Constant;Discomfort;Grimacing;Headache;Moaning;Guarding  Pain Intervention(s) Monitored during session;Limited activity within patient's tolerance;Repositioned  Cognition  Arousal/Alertness Awake/alert  Behavior During Therapy WFL for tasks assessed/performed  Overall Cognitive Status Difficult to assess  Area of Impairment Following commands;Awareness;Problem solving;Attention  Current Attention Level Sustained (short)  Following Commands Follows one step commands with increased time;Follows one step commands inconsistently  Awareness Intellectual  Problem Solving Difficulty sequencing;Requires verbal cues;Requires tactile cues;Slow processing  General Comments Pt with decreased attention to R side. During grooming task, pt requiring Max cues to intiate and then attend to comb. Pt with short attention and only engaging in task for ~30-60 seconds. Pt frustrated by communication deficits and aphasia. Able to attend and follow cues with clear direct commands and quiet environment. Becomes over stimulated  Difficult to assess due to Impaired communication  Upper Extremity Assessment  Upper Extremity Assessment RUE deficits/detail  RUE Deficits / Details Decreased attention to RUE. Poor coorindation and grasp. Unable to follow commands for muscle testing but able to bring hand to mouth to scatch face when not thinking about it.  RUE Coordination decreased fine motor;decreased gross motor  Lower Extremity Assessment  Lower Extremity Assessment Defer to PT evaluation  ADL  Overall ADL's  Needs assistance/impaired  Eating/Feeding Minimal assistance;Bed level;Sitting  Grooming Brushing hair;Minimal assistance;Standing;Maximal assistance;Cueing for sequencing  Grooming Details (indicate cue type and reason) Pt requiring Min A for balance due to R inattention and slight lean. Pt requiring Max cues  to initate and attend to brushing her hair. Pt initially wiping her hair with a paper towl and requiring max cues for  transision.  Once iniated, pt able to brush her hair with comb; short attention.  Upper Body Bathing Minimal assistance;Sitting  Lower Body Bathing Moderate assistance;Sit to/from stand  Upper Body Dressing  Minimal assistance;Sitting  Lower Body Dressing Moderate assistance;Sit to/from stand  Lower Body Dressing Details (indicate cue type and reason) Pt able to don socks with Min A for initating task. once socks pulled over toes, pt able to then complete task using figure four method. Mod A for dynamic balance in standing  Toilet Transfer Minimal assistance;+2 for safety/equipment;Ambulation;Regular Toilet  Toileting- Clothing Manipulation and Hygiene Moderate assistance;Sit to/from stand  Functional mobility during ADLs Moderate assistance;+2 for safety/equipment  General ADL Comments Pt presenting with decreased balance, strength, attention to  R side, and cognitive deficits.  Vision- History  Baseline Vision/History Wears glasses  Wears Glasses Reading only  Patient Visual Report No change from baseline  Vision- Assessment  Additional Comments Need to further assess  Perception  Perception Tested? Yes  Perception Deficits Inattention/neglect  Inattention/Neglect Does not attend to right side of body  Bed Mobility  Overal bed mobility Needs Assistance  Bed Mobility Supine to Sit  Supine to sit Mod assist;+2 for physical assistance;HOB elevated  General bed mobility comments Mod A to bring BELs towards EOB and initate task. Pt then able to bring trunk up with Min support. +2 for safety  Transfers  Overall transfer level Needs assistance  Equipment used 2 person hand held assist  Transfers Sit to/from Stand  Sit to Stand Min assist;+2 physical assistance;+2 safety/equipment  General transfer comment MIn A for power up and gaining balance  Balance  Overall balance  assessment Needs assistance  Sitting-balance support No upper extremity supported;Feet supported  Sitting balance-Leahy Scale Good  Sitting balance - Comments Able to don socks  Standing balance support Single extremity supported;During functional activity  Standing balance-Leahy Scale Poor  Standing balance comment reliant on UE support  General Comments  General comments (skin integrity, edema, etc.) HR max 140 with gait and pain (increased HA with mobility), returned to 100s with seated rest. Daughter Joelene Millin) and husband Audry Pili).  OT - End of Session  Equipment Utilized During Treatment Gait belt  Activity Tolerance Patient tolerated treatment well  Patient left in chair;with call bell/phone within reach;with chair alarm set  Nurse Communication Mobility status  OT Assessment  OT Recommendation/Assessment Patient needs continued OT Services  OT Visit Diagnosis Unsteadiness on feet (R26.81);Other abnormalities of gait and mobility (R26.89);Muscle weakness (generalized) (M62.81)  OT Problem List Decreased strength;Decreased range of motion;Decreased activity tolerance;Impaired balance (sitting and/or standing);Decreased cognition;Decreased safety awareness;Decreased knowledge of use of DME or AE;Decreased knowledge of precautions;Impaired UE functional use;Pain  OT Plan  OT Frequency (ACUTE ONLY) Min 2X/week  OT Treatment/Interventions (ACUTE ONLY) Self-care/ADL training;Therapeutic exercise;Energy conservation;DME and/or AE instruction;Therapeutic activities;Patient/family education;Balance training;Cognitive remediation/compensation  AM-PAC OT "6 Clicks" Daily Activity Outcome Measure (Version 2)  Help from another person eating meals? 3  Help from another person taking care of personal grooming? 2  Help from another person toileting, which includes using toliet, bedpan, or urinal? 2  Help from another person bathing (including washing, rinsing, drying)? 2  Help from another person to  put on and taking off regular upper body clothing? 3  Help from another person to put on and taking off regular lower body clothing? 2  6 Click Score 14  OT Recommendation  Recommendations for Other Services Rehab consult;PT consult;Speech consult  Follow Up Recommendations CIR  OT Equipment Other (comment);Tub/shower seat (  Defer to next venue)  Individuals Consulted  Consulted and Agree with Results and Recommendations Family member/caregiver;Patient  Family Member Consulted Husband, Ricky  Acute Rehab OT Goals  Patient Stated Goal Family, "get back to her normal and return home"  OT Goal Formulation With patient/family  Time For Goal Achievement 05/03/20  Potential to Achieve Goals Good  OT Time Calculation  OT Start Time (ACUTE ONLY) 0820  OT Stop Time (ACUTE ONLY) 0855  OT Time Calculation (min) 35 min  OT General Charges  $OT Visit 1 Visit  OT Evaluation  $OT Eval Moderate Complexity 1 Mod  Written Expression  Dominant Hand Right   Coin, OTR/L Acute Rehab Pager: [phone] Office: [phone]

## 2020-04-19 NOTE — Progress Notes (Signed)
Inpatient Rehabilitation Admissions Coordinator  I met at bedside with patient, daughter, spouse and Dr. Naaman Plummer. We discussed goals and expectations of a possible CIR admit. They would like to discuss further and see how she does functionally over next 24 hrs before deciding CIR vs home with OP therapy. She has great family support as she sees them extended family outside of her ICU window.  Danne Baxter, RN, MSN Rehab Admissions Coordinator [phone] 04/19/2020 12:36 PM

## 2020-04-20 ENCOUNTER — Inpatient Hospital Stay (HOSPITAL_COMMUNITY): Payer: 59

## 2020-04-20 LAB — RAPID HIV SCREEN (HIV 1/2 AB+AG)
HIV 1/2 Antibodies: NONREACTIVE
HIV-1 P24 Antigen - HIV24: NONREACTIVE

## 2020-04-20 LAB — CBC
HCT: 36.2 % (ref 36.0–46.0)
Hemoglobin: 11.8 g/dL — ABNORMAL LOW (ref 12.0–15.0)
MCH: 28.6 pg (ref 26.0–34.0)
MCHC: 32.6 g/dL (ref 30.0–36.0)
MCV: 87.7 fL (ref 80.0–100.0)
Platelets: 237 10*3/uL (ref 150–400)
RBC: 4.13 MIL/uL (ref 3.87–5.11)
RDW: 15.6 % — ABNORMAL HIGH (ref 11.5–15.5)
WBC: 9.3 10*3/uL (ref 4.0–10.5)
nRBC: 0 % (ref 0.0–0.2)

## 2020-04-20 LAB — BASIC METABOLIC PANEL
Anion gap: 7 (ref 5–15)
BUN: 18 mg/dL (ref 8–23)
CO2: 24 mmol/L (ref 22–32)
Calcium: 8.4 mg/dL — ABNORMAL LOW (ref 8.9–10.3)
Chloride: 106 mmol/L (ref 98–111)
Creatinine, Ser: 0.88 mg/dL (ref 0.44–1.00)
GFR, Estimated: >60 mL/min (ref >60)
Glucose, Bld: 96 mg/dL (ref 70–99)
Potassium: 3.4 mmol/L — ABNORMAL LOW (ref 3.5–5.1)
Sodium: 137 mmol/L (ref 135–145)

## 2020-04-20 NOTE — Progress Notes (Signed)
Inpatient Rehabilitation Admissions Coordinator  I was notified by PT that their recommendations are for outpatient at this time. We will sign off.  Danne Baxter, RN, MSN Rehab Admissions Coordinator [phone] 04/20/2020 3:14 PM

## 2020-04-20 NOTE — Progress Notes (Signed)
  Speech Language Pathology Treatment: Dysphagia;Cognitive-Linquistic (Aphasia)  Patient Details Name: Tracey [name] MRN: [id number] DOB: 1955/08/08 Today's Date: 04/20/2020 Time: 1600-1640 SLP Time Calculation (min) (ACUTE ONLY): 40 min  Assessment / Plan / Recommendation Clinical Impression  Pt was seen for aphasia treatment with her husband present. She was alert and cooperative during the session. Pt's verbal output was notably improved compared to the initial evaluation. Pt reported that her daughter is an SLP and that she will be working with her following discharge. She was advised that use outpatient SLP services would likely still be beneficial to facilitate further improvement and pt was in agreement with this. Pt and her husband were educated regarding the nature of aphasia, the severity of the pt's deficits, and strategies to improve auditory comprehension, facilitate communication with the pt, and to reduce the pt's frustration. Both parties verbalized understanding as well as agreement and was noted to use some of the strategies during the session. Pt demonstrated 60% accuracy with confrontational naming increasing to 80% accuracy with self-correction and to 100% with part-word cues. 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 She demonstrated 80% accuracy with sentence formulation increasing to 100% with cues. To increase kinestitic awareness and spatial targeting, she produced words with increasing length with 20% accuracy increasing to 100% with verbal prompts and models. She achieved 100% accuracy with reading comprehension and with spelling at the word level. Pt was seen for dysphagia treatment and was cooperative throughout the session. Pt, nursing, and her husband reported that the pt has been tolerating the current diet without overt s/sx of aspiration. Pt tolerated regular texture solids, and thin liquids via straw using consecutive  swallows without symptoms of oropharyngeal dysphagia. It is recommended that the current diet be continued. SLP will continue to follow pt for aphasia, but further services are not clinically indicated for swallowing at this time.    HPI HPI: Tracey [name] is a 65 y.o. female with a PMHx of CAD s/p MI, cardiac arrest, HTN, hypothryoidism, HLD, and anxiety. Presented to [facility] ED with slurred speech and and word finding difficulty. CTA head and neck showed a distal M2 occlusion in left MCA branch. Per chart tPA emergently stopped when increased dysarthria noted> emergent head CT showed no intracranial hemorrhage. Underwent revascularization of occluded Lt MCA following left MCA embolism.      SLP Plan  Continue with current plan of care       Recommendations  Diet recommendations: Regular;Thin liquid Liquids provided via: Cup;Straw Medication Administration: Whole meds with liquid Supervision: Patient able to self feed Compensations: Slow rate;Small sips/bites Postural Changes and/or Swallow Maneuvers: Seated upright 90 degrees                Oral Care Recommendations: Oral care BID Follow up Recommendations: Outpatient SLP SLP Visit Diagnosis: Aphasia (R47.01) Plan: Continue with current plan of care       Noora Locascio I. Hardin Negus, [place], [place] Office number [phone] Pager [place] 04/20/2020, 4:56 PM

## 2020-04-20 NOTE — Discharge Instructions (Addendum)
Heart monitor implant site: wound care Keep incision clean and dry for 3 days. You can remove outer dressing tomorrow. Leave steri-strips (little pieces of tape) on until seen in the office for wound check appointment. Call the office [phone]) for redness, drainage, swelling, or fever.    Femoral Site Care This sheet gives you information about how to care for yourself after your procedure. Your health care provider may also give you more specific instructions. If you have problems or questions, contact your health care provider. What can I expect after the procedure? After the procedure, it is common to have:  Bruising that usually fades within 1-2 weeks.  Tenderness at the site. Follow these instructions at home: Wound care 1. Follow instructions from your health care provider about how to take care of your insertion site. Make sure you: ? Wash your hands with soap and water before you change your bandage (dressing). If soap and water are not available, use hand sanitizer. ? Change your dressing as directed- pressure dressing removed 24 hours post-procedure (and switch for bandaid), bandaid removed 72 hours post-procedure 2. Do not take baths, swim, or use a hot tub for 7 days post-procedure. 3. You may shower 48 hours after the procedure or as told by your health care provider. ? Gently wash the site with plain soap and water. ? Pat the area dry with a clean towel. ? Do not rub the site. This may cause bleeding. 4. Check your site every day for signs of infection. Check for: ? Redness, swelling, or pain. ? Fluid or blood. ? Warmth. ? Pus or a bad smell. Activity  Do not stoop, bend, or lift anything that is heavier than 10 lb (4.5 kg) for 2 weeks post-procedure.  Do not drive self for 2 weeks post-procedure. Contact a health care provider if you have:  A fever or chills.  You have redness, swelling, or pain around your insertion site. Get help right away if:  The  catheter insertion area swells very fast.  You pass out.  You suddenly start to sweat or your skin gets clammy.  The catheter insertion area is bleeding, and the bleeding does not stop when you hold steady pressure on the area.  The area near or just beyond the catheter insertion site becomes pale, cool, tingly, or numb. These symptoms may represent a serious problem that is an emergency. Do not wait to see if the symptoms will go away. Get medical help right away. Call your local emergency services (911 in the [place] Do not drive yourself to the hospital.  This information is not intended to replace advice given to you by your health care provider. Make sure you discuss any questions you have with your health care provider. Document Revised: 01/26/2017 Document Reviewed: 01/26/2017 Elsevier Patient Education  2020 [employer].

## 2020-04-20 NOTE — Progress Notes (Signed)
Physical Therapy Treatment Patient Details Name: Tracey [name] MRN: [id number] DOB: 04-17-1955 Today's Date: 04/20/2020    History of Present Illness The pt is a 65 yo female presenting 3/23 with slurred speech and expressive aphasia. Imaging revealed L distal MCA occlusion, and pt is now s/p partial revascularization on 3/23. PMH includes: HLD, PAD, GERD, MI (s/p stent placement and cardiac rehab, followed by [facility]), CAD.    PT Comments    Pt making excellent improvements and goals met and updated.  Improved strength, R inattention, and balance.  Presents with mild deficits in balance that will benefit from further therapy.  Pt and family agree and feel that largest deficit is speech.  Updated d/c plan to outpt PT.    Follow Up Recommendations  Outpatient PT     Equipment Recommendations  None recommended by PT    Recommendations for Other Services Rehab consult     Precautions / Restrictions Precautions Precautions: Fall    Mobility  Bed Mobility Overal bed mobility: Independent                  Transfers Overall transfer level: Needs assistance Equipment used: None Transfers: Sit to/from Stand Sit to Stand: Supervision         General transfer comment: Supervision for safety  Ambulation/Gait Ambulation/Gait assistance: Supervision;Min guard Gait Distance (Feet): 300 Feet Assistive device: None Gait Pattern/deviations: Step-through pattern;Wide base of support Gait velocity: decreased   General Gait Details: Pt ambulated 300' with PT, additionally ambulated with family earlier.  She demonstrates increased lateral movement but no LOB.  Reports some issues with balance at baseline.   Stairs             Wheelchair Mobility    Modified Rankin (Stroke Patients Only) Modified Rankin (Stroke Patients Only) Pre-Morbid Rankin Score: No symptoms Modified Rankin: Moderate disability     Balance Overall balance assessment: Needs assistance    Sitting balance-Leahy Scale: Normal     Standing balance support: No upper extremity supported Standing balance-Leahy Scale: Good                              Cognition Arousal/Alertness: Awake/alert Behavior During Therapy: WFL for tasks assessed/performed Overall Cognitive Status: Within Functional Limits for tasks assessed                                 General Comments: Pt following commands and attending to R side today.  Does continue to have expressive aphasia/word finding difficulty      Exercises      General Comments General comments (skin integrity, edema, etc.): VSS; Strength testing 5/5 throughout; some c/o numbness R palm but reports main deficit is speech; family present and supportive - report can provide 24 hr support at d/c.  Discussed good progress and no longer needing CIR from PT perspective      Pertinent Vitals/Pain Pain Assessment: Faces Faces Pain Scale: Hurts little more Pain Location: HA Pain Descriptors / Indicators: Discomfort Pain Intervention(s): Limited activity within patient's tolerance;Monitored during session;Patient requesting pain meds-RN notified    Home Living                      Prior Function            PT Goals (current goals can now be found in the care plan section) Acute  Rehab PT Goals Patient Stated Goal: return home PT Goal Formulation: With patient/family Time For Goal Achievement: 05/04/20 Potential to Achieve Goals: Good Progress towards PT goals: Goals met and updated - see care plan    Frequency    Min 4X/week      PT Plan Discharge plan needs to be updated    Co-evaluation              AM-PAC PT "6 Clicks" Mobility   Outcome Measure  Help needed turning from your back to your side while in a flat bed without using bedrails?: None Help needed moving from lying on your back to sitting on the side of a flat bed without using bedrails?: None Help needed moving to  and from a bed to a chair (including a wheelchair)?: A Little Help needed standing up from a chair using your arms (e.g., wheelchair or bedside chair)?: A Little Help needed to walk in hospital room?: A Little Help needed climbing 3-5 steps with a railing? : A Little 6 Click Score: 20    End of Session   Activity Tolerance: Patient tolerated treatment well Patient left: in bed;with family/visitor present;with call bell/phone within reach Nurse Communication: Mobility status;Patient requests pain meds PT Visit Diagnosis: Unsteadiness on feet (R26.81);Other abnormalities of gait and mobility (R26.89);Hemiplegia and hemiparesis Hemiplegia - Right/Left: Right Hemiplegia - dominant/non-dominant: Dominant Hemiplegia - caused by: Cerebral infarction     Time: 1440-1503 PT Time Calculation (min) (ACUTE ONLY): 23 min  Charges:  $Gait Training: 8-22 mins $Therapeutic Activity: 8-22 mins                     Abran Richard, PT Acute Rehab Services Pager [phone] [name] 04/20/2020, 3:14 PM

## 2020-04-20 NOTE — Progress Notes (Signed)
Referring Physician(s): Code stroke -  [name] (Neuro)  Supervising Physician: Luanne Bras  Patient Status:  [facility] - In-pt  Chief Complaint: stroke f/u  Subjective:  History of CVA s/p cerebral arteriogram and partial revascularization of left MCA inferior division distal M2 to distal M3/M4 junction on 04/18/20 with Dr. Estanislado Pandy.  Patient laying bed with family members and neurology team at the bedside.  Patient is alert ans oriented, and speech has improved.  Able to follow commands and able to move all extremities freely.  Right groin puncture site soft with no sign of hematoma or active bleeding.    Allergies: Sulfa antibiotics  Medications: Prior to Admission medications   Medication Sig Start Date End Date Taking? Authorizing Provider  aspirin EC 81 MG tablet Take 81 mg by mouth daily.   Yes [provider]  Cholecalciferol 50 MCG (2000 UT) TABS Take 2,000 Units by mouth daily.   Yes [provider]  citalopram (CELEXA) 20 MG tablet Take 20 mg by mouth daily. 04/17/20  Yes [provider]  cyanocobalamin (,VITAMIN B-12,) 1000 MCG/ML injection Inject 1,000 mcg into the muscle every 30 (thirty) days. 10/17/15  Yes [provider]  levothyroxine (SYNTHROID) 75 MCG tablet Take 75 mcg by mouth daily. 04/17/20  Yes [provider]  metoprolol succinate (TOPROL-XL) 25 MG 24 hr tablet Take 25 mg by mouth daily.   Yes [provider]  nitroGLYCERIN (NITROSTAT) 0.4 MG SL tablet Place 0.4 mg under the tongue every 5 (five) minutes as needed for chest pain.   Yes [provider]  pantoprazole (PROTONIX) 40 MG tablet Take 40 mg by mouth daily.   Yes [provider]  ramipril (ALTACE) 5 MG capsule Take 5 mg by mouth daily.   Yes [provider]  rosuvastatin (CRESTOR) 40 MG tablet Take 40 mg by mouth daily.   Yes [provider]     Vital Signs: BP 112/89 (BP Location: Right Arm)   Pulse  88   Temp 98.1 F (36.7 C) (Oral)   Resp 16   Wt 215 lb 2.7 oz (97.6 kg)   SpO2 96%   BMI 35.81 kg/m   Physical Exam Vitals and nursing note reviewed.  Constitutional:      General: She is not in acute distress.    Appearance: Normal appearance.  Skin:    Comments: Right groin puncture site soft with no sign of hematoma or active bleeding.   Neurological:     Mental Status: She is alert.     Comments: Alert, awake, and oriented  Speech and comprehension improved PERRL  EOMs no nystagmus or subjective diplopia. No facial asymmetry. Motor power intact  Distal pulses 1+      Imaging: CT ANGIO HEAD W OR WO CONTRAST  Result Date: 04/18/2020 CLINICAL DATA:  Neuro deficit, acute, stroke suspected. Additional history provided: Speech difficulty, right facial numbness. EXAM: CT ANGIOGRAPHY HEAD AND NECK CT PERFUSION BRAIN TECHNIQUE: Multidetector CT imaging of the head and neck was performed using the standard protocol during bolus administration of intravenous contrast. Multiplanar CT image reconstructions and MIPs were obtained to evaluate the vascular anatomy. Carotid stenosis measurements (when applicable) are obtained utilizing NASCET criteria, using the distal internal carotid diameter as the denominator. Multiphase CT imaging of the brain was performed following IV bolus contrast injection. Subsequent parametric perfusion maps were calculated using RAPID software. CONTRAST:  133mL OMNIPAQUE IOHEXOL 350 MG/ML SOLN COMPARISON:  Report from brain MRI 12/15/1998 (images unavailable).  FINDINGS: CTA NECK FINDINGS Aortic arch: Standard aortic branching. The visualized aortic arch is normal in caliber. Atherosclerotic plaque within the visualized aortic arch and proximal major branch vessels of the neck. No hemodynamically significant innominate or proximal subclavian artery stenosis. Right carotid system: CCA and ICA patent within the neck without stenosis or significant atherosclerotic  disease. Calcified plaque within the proximal ECA. Left carotid system: CCA and ICA patent within the neck without stenosis stenosis. Trace calcified plaque within the carotid bifurcation. Vertebral arteries: Codominant and patent within the neck without stenosis. Skeleton: Cervical spondylosis with multilevel disc space narrowing, disc bulges, uncovertebral hypertrophy. No acute bony abnormality or aggressive osseous lesion. Other neck: No neck mass or cervical lymphadenopathy. Upper chest: No consolidation within the imaged lung apices. Review of the MIP images confirms the above findings CTA HEAD FINDINGS Anterior circulation: The intracranial internal carotid arteries are patent. Mild calcified plaque within both vessels without stenosis. The M1 middle cerebral arteries are patent. There is an occluded distal M2 left MCA branch vessel (series 10, image 52) (series 12, image 27). The anterior cerebral arteries are patent. No intracranial aneurysm is identified. Posterior circulation: The intracranial vertebral arteries are patent. The basilar artery is patent. The posterior cerebral arteries are patent. Posterior communicating arteries are hypoplastic or absent bilaterally. Venous sinuses: Within the limitations of contrast timing, no convincing thrombus. Anatomic variants: As described Review of the MIP images confirms the above findings CT Brain Perfusion Findings: CBF (<30%) Volume: 39mL Perfusion (Tmax>6.0s) volume: 19mL (posterior left MCA vascular territory) Mismatch Volume: 69mL Infarction Location:None identified These results were called by telephone at the time of interpretation on 04/18/2020 at 10:47 am to provider [facility] , who verbally acknowledged these results. IMPRESSION: CTA neck: The common carotid, internal carotid and vertebral arteries are patent within the neck without hemodynamically significant stenosis. CTA head: 1. Occluded distal M2 left MCA branch vessel. 2. Mild non-stenotic  calcified plaque within the intracranial ICAs bilaterally. CT perfusion head: The perfusion software identifies a 16 mL region of hypoperfusion within the posterior left MCA territory utilizing the Tmax>6 seconds threshold. The perfusion software identifies no core infarct. Mismatch volume: 16 mL. Electronically Signed   By: Kellie Simmering DO   On: 04/18/2020 10:59   CT HEAD WO CONTRAST  Result Date: 04/20/2020 INDICATION: New onset global aphasia, with worsening right-sided weakness. Occluded inferior division left middle cerebral artery mid M2 segment on CT angiogram of the head and neck. EXAM: 1. EMERGENT LARGE VESSEL OCCLUSION THROMBOLYSIS (anterior CIRCULATION) COMPARISON:  CT angiogram of the head and neck of April 18, 2020. MEDICATIONS: Ancef 2 g IV antibiotic was administered within 1 hour of the procedure. ANESTHESIA/SEDATION: General anesthesia CONTRAST:  Isovue 300 approximately 125 mL FLUOROSCOPY TIME:  Fluoroscopy Time: 24 minutes 36 seconds (1652 mGy). COMPLICATIONS: None immediate. TECHNIQUE: Following a full explanation of the procedure along with the potential associated complications, an informed witnessed consent was obtained. The risks of intracranial hemorrhage of 10%, worsening neurological deficit, ventilator dependency, death and inability to revascularize were all reviewed in detail with the patient's spouse. The patient was then put under general anesthesia by the Department of Anesthesiology at [facility] The right groin was prepped and draped in the usual sterile fashion. Thereafter using modified Seldinger technique, transfemoral access into the right common femoral artery was obtained without difficulty. Over a 0.035 inch guidewire an 8 French 25 cm Pinnacle sheath was inserted. Through this, and also over a 0.035 inch guidewire a 5  [place] JB 1 catheter was advanced to the aortic arch region and selectively positioned in the left common carotid artery. FINDINGS: Left common  carotid arteriogram demonstrates the left external carotid artery and its major branches to be widely patent. The left internal carotid artery at the bulb to the cranial skull base is widely patent. The left middle cerebral artery opacifies into the capillary and venous phases. Occlusion of the left middle cerebral artery inferior division prominent parietal branch is seen in the mid M2 to distal M2 region. This is associated with a moderate sized wedge shaped hypo perfusion defect. Left anterior cerebral artery opacifies into the capillary and venous phases. PROCEDURE: Diagnostic 5 [place] JB 1 catheter in the left common carotid artery was then exchanged for a 0.035 inch 300 cm Rosen exchange guidewire for an 087 balloon guide catheter which had been prepped with 50% contrast and 50% heparinized saline infusion and advanced to the mid cervical left ICA. The guidewire was removed. Good aspiration obtained from the hub of the balloon guide catheter. Through this, and over a 0.014 inch Synchro standard micro guidewire a combination of a 136 cm 071 Zoom aspiration catheter and an 021 162 cm Trevo microcatheter was advanced to the supraclinoid left ICA. Micro guidewire was then gently manipulated with a torque device and advanced without difficulty through the occluded mid to distal M2 segment with a wire and the microcatheter being advanced to the M3 M4 junction. The micro guidewire was removed. There was slow aspiration of blood. A gentle contrast injection through the microcatheter demonstrated slow antegrade flow distal to the microcatheter. The micro guidewire was reintroduced and advanced further distally with the microcatheter also advanced slightly distally. The guidewire was removed. Again there was moderately improved aspiration of blood at the hub of the microcatheter which was then connected to continuous heparinized saline infusion. The 071 Zoom aspiration catheter was advanced just inside the prominent  proximal inferior division. With proximal flow arrest slow aspiration was performed with a 20 mL syringe at hub of the microcatheter, and with Penumbra aspiration device at the hub of the Zoom aspiration catheter for approximately 1-1/2 minutes. Thereafter, the combination of the microcatheter, the Zoom aspiration device was then gently retrieved and removed. No aspiration was noted at the hub of the microcatheter. Flow arrest was then reversed. A gentle control arteriogram performed through the balloon guide catheter in the distal left internal carotid artery demonstrated patent the left MCA distribution with moderately improved recanalization of the previously noted occluded inferior division branch with the clot now noted more distally in the M3 M4 junction. Also noted was a moderate vasospasm of the left middle cerebral artery M1 segment. This responded to 2.5 mg of verapamil IA, and also 2 aliquots of 25 mcg of nitroglycerin intra-arterially. Given the increased risk of hemorrhagic complication with further mechanical thrombectomy of the small caliber of the distal occluded branch in the M3 M4 region it was not undertaken. Additional super selective IA infusion of tPA it was not performed for the same reason. The balloon guide was then retrieved into the left common carotid artery. Arteriogram performed through this continued to demonstrate wide patency of the left internal carotid artery proximally and distally with significantly improved caliber of the left middle cerebral artery due to vasospasm. Further recanalization of the previously occluded branch was noted on this arteriogram. A TICI 2C revascularization was achieved. The balloon guide was removed. An 8 French Angio-Seal hemostasis device was applied at the right groin  puncture site. Distal pulses remained Dopplerable in all 4 extremities. A CT of the brain demonstrated cortical enhancement of the left frontotemporal, and left parietal regions probably  representing hyperemia. Also noted was tubular high focal density in the distal cortical vasculature probably representing a vacuum phenomenon. No mass-effect or midline shift was noted. Patient's general anesthesia was reversed and the patient was then extubated without event. The patient was able to move all 4 extremities with the right upper extremity weaker. Patient continued to be globally aphasic. She was then transferred to the neuro ICU for post revascularization care. IMPRESSION: Status post endovascular partial recanalization of occluded left middle cerebral artery inferior division branch in the mid M2 to distal M2 region to the M3 M4 region with contact aspiration and proximal aspiration achieving a TICI 2C revascularization. PLAN: Follow-up in the clinic 2-4 weeks post discharge. Electronically Signed   By: Luanne Bras M.D.   On: 04/19/2020 11:48   CT HEAD WO CONTRAST  Result Date: 04/19/2020 CLINICAL DATA:  Stroke post tPA and thrombectomy EXAM: CT HEAD WITHOUT CONTRAST TECHNIQUE: Contiguous axial images were obtained from the base of the skull through the vertex without intravenous contrast. COMPARISON:  [place] FINDINGS: Brain: There is no acute intracranial hemorrhage. A more well-defined area of hypoattenuation is present in the left parietal lobe. No mass effect. Ventricles are stable in size. Vascular: No new findings. Skull: Calvarium is unremarkable. Sinuses/Orbits: No acute finding. Other: None. IMPRESSION: No acute hemorrhage. Small more well-defined area of evolving acute infarction is present in the left parietal lobe. Electronically Signed   By: Macy Mis M.D.   On: 04/19/2020 11:53   CT Head Wo Contrast  Result Date: 04/18/2020 CLINICAL DATA:  Provided history: Status post tPA, worsening symptoms. EXAM: CT HEAD WITHOUT CONTRAST TECHNIQUE: Contiguous axial images were obtained from the base of the skull through the vertex without intravenous contrast. COMPARISON:   Noncontrast head CT, CT angiogram head/neck and CT perfusion FINDINGS: Brain: Cerebral volume is normal for age. Redemonstrated small amount of ill-defined hypoattenuation within the posterior left frontal lobe head in the setting of an acute distal left M2 MCA branch vessel occlusion, this may reflect a small acute white matter infarct or chronic small vessel ischemic disease. Stable background mild cerebral white matter chronic small vessel ischemic disease. There is no acute intracranial hemorrhage. No demarcated cortical infarct. No extra-axial fluid collection. No evidence of intracranial mass. No midline shift. Vascular: Residual circulating intravascular contrast limits evaluation for hyperdense vessels. Skull: No calvarial fracture. Sinuses/Orbits: Visualized orbits show no acute finding. Tiny mucous retention cyst and small volume frothy secretions within the right sphenoid sinus. These results were called by telephone at the time of interpretation on 04/18/2020 at 12:14 pm to provider Dr. Curly [place], who verbally acknowledged these results. IMPRESSION: No evidence of acute intracranial hemorrhage status post tPA. No demarcated cortical infarct. As before, there is subtle hypodensity within the posterior left frontal lobe white matter. In the setting of an acute distal left M2 MCA branch occlusion, this may reflect a small acute white matter infarct or chronic small vessel ischemic disease. Stable background mild cerebral white matter chronic small vessel ischemic disease. Right sphenoid sinusitis. Electronically Signed   By: Kellie Simmering DO   On: 04/18/2020 12:26   CT ANGIO NECK W OR WO CONTRAST  Result Date: 04/18/2020 CLINICAL DATA:  Neuro deficit, acute, stroke suspected. Additional history provided: Speech difficulty, right facial numbness. EXAM: CT ANGIOGRAPHY HEAD AND NECK CT PERFUSION BRAIN  TECHNIQUE: Multidetector CT imaging of the head and neck was performed using the standard protocol during bolus  administration of intravenous contrast. Multiplanar CT image reconstructions and MIPs were obtained to evaluate the vascular anatomy. Carotid stenosis measurements (when applicable) are obtained utilizing NASCET criteria, using the distal internal carotid diameter as the denominator. Multiphase CT imaging of the brain was performed following IV bolus contrast injection. Subsequent parametric perfusion maps were calculated using RAPID software. CONTRAST:  145mL OMNIPAQUE IOHEXOL 350 MG/ML SOLN COMPARISON:  Report from brain MRI 12/15/1998 (images unavailable). FINDINGS: CTA NECK FINDINGS Aortic arch: Standard aortic branching. The visualized aortic arch is normal in caliber. Atherosclerotic plaque within the visualized aortic arch and proximal major branch vessels of the neck. No hemodynamically significant innominate or proximal subclavian artery stenosis. Right carotid system: CCA and ICA patent within the neck without stenosis or significant atherosclerotic disease. Calcified plaque within the proximal ECA. Left carotid system: CCA and ICA patent within the neck without stenosis stenosis. Trace calcified plaque within the carotid bifurcation. Vertebral arteries: Codominant and patent within the neck without stenosis. Skeleton: Cervical spondylosis with multilevel disc space narrowing, disc bulges, uncovertebral hypertrophy. No acute bony abnormality or aggressive osseous lesion. Other neck: No neck mass or cervical lymphadenopathy. Upper chest: No consolidation within the imaged lung apices. Review of the MIP images confirms the above findings CTA HEAD FINDINGS Anterior circulation: The intracranial internal carotid arteries are patent. Mild calcified plaque within both vessels without stenosis. The M1 middle cerebral arteries are patent. There is an occluded distal M2 left MCA branch vessel (series 10, image 52) (series 12, image 27). The anterior cerebral arteries are patent. No intracranial aneurysm is  identified. Posterior circulation: The intracranial vertebral arteries are patent. The basilar artery is patent. The posterior cerebral arteries are patent. Posterior communicating arteries are hypoplastic or absent bilaterally. Venous sinuses: Within the limitations of contrast timing, no convincing thrombus. Anatomic variants: As described Review of the MIP images confirms the above findings CT Brain Perfusion Findings: CBF (<30%) Volume: 10mL Perfusion (Tmax>6.0s) volume: 69mL (posterior left MCA vascular territory) Mismatch Volume: 77mL Infarction Location:None identified These results were called by telephone at the time of interpretation on 04/18/2020 at 10:47 am to provider [facility] , who verbally acknowledged these results. IMPRESSION: CTA neck: The common carotid, internal carotid and vertebral arteries are patent within the neck without hemodynamically significant stenosis. CTA head: 1. Occluded distal M2 left MCA branch vessel. 2. Mild non-stenotic calcified plaque within the intracranial ICAs bilaterally. CT perfusion head: The perfusion software identifies a 16 mL region of hypoperfusion within the posterior left MCA territory utilizing the Tmax>6 seconds threshold. The perfusion software identifies no core infarct. Mismatch volume: 16 mL. Electronically Signed   By: Kellie Simmering DO   On: 04/18/2020 10:59   IR CT Head Ltd  Result Date: 04/20/2020 INDICATION: New onset global aphasia, with worsening right-sided weakness. Occluded inferior division left middle cerebral artery mid M2 segment on CT angiogram of the head and neck. EXAM: 1. EMERGENT LARGE VESSEL OCCLUSION THROMBOLYSIS (anterior CIRCULATION) COMPARISON:  CT angiogram of the head and neck of April 18, 2020. MEDICATIONS: Ancef 2 g IV antibiotic was administered within 1 hour of the procedure. ANESTHESIA/SEDATION: General anesthesia CONTRAST:  Isovue 300 approximately 125 mL FLUOROSCOPY TIME:  Fluoroscopy Time: 24 minutes 36 seconds (1652  mGy). COMPLICATIONS: None immediate. TECHNIQUE: Following a full explanation of the procedure along with the potential associated complications, an informed witnessed consent was obtained. The risks of intracranial  hemorrhage of 10%, worsening neurological deficit, ventilator dependency, death and inability to revascularize were all reviewed in detail with the patient's spouse. The patient was then put under general anesthesia by the Department of Anesthesiology at [facility]. The right groin was prepped and draped in the usual sterile fashion. Thereafter using modified Seldinger technique, transfemoral access into the right common femoral artery was obtained without difficulty. Over a 0.035 inch guidewire an 8 French 25 cm Pinnacle sheath was inserted. Through this, and also over a 0.035 inch guidewire a 5 [place] JB 1 catheter was advanced to the aortic arch region and selectively positioned in the left common carotid artery. FINDINGS: Left common carotid arteriogram demonstrates the left external carotid artery and its major branches to be widely patent. The left internal carotid artery at the bulb to the cranial skull base is widely patent. The left middle cerebral artery opacifies into the capillary and venous phases. Occlusion of the left middle cerebral artery inferior division prominent parietal branch is seen in the mid M2 to distal M2 region. This is associated with a moderate sized wedge shaped hypo perfusion defect. Left anterior cerebral artery opacifies into the capillary and venous phases. PROCEDURE: Diagnostic 5 [place] JB 1 catheter in the left common carotid artery was then exchanged for a 0.035 inch 300 cm Rosen exchange guidewire for an 087 balloon guide catheter which had been prepped with 50% contrast and 50% heparinized saline infusion and advanced to the mid cervical left ICA. The guidewire was removed. Good aspiration obtained from the hub of the balloon guide catheter. Through this,  and over a 0.014 inch Synchro standard micro guidewire a combination of a 136 cm 071 Zoom aspiration catheter and an 021 162 cm Trevo microcatheter was advanced to the supraclinoid left ICA. Micro guidewire was then gently manipulated with a torque device and advanced without difficulty through the occluded mid to distal M2 segment with a wire and the microcatheter being advanced to the M3 M4 junction. The micro guidewire was removed. There was slow aspiration of blood. A gentle contrast injection through the microcatheter demonstrated slow antegrade flow distal to the microcatheter. The micro guidewire was reintroduced and advanced further distally with the microcatheter also advanced slightly distally. The guidewire was removed. Again there was moderately improved aspiration of blood at the hub of the microcatheter which was then connected to continuous heparinized saline infusion. The 071 Zoom aspiration catheter was advanced just inside the prominent proximal inferior division. With proximal flow arrest slow aspiration was performed with a 20 mL syringe at hub of the microcatheter, and with Penumbra aspiration device at the hub of the Zoom aspiration catheter for approximately 1-1/2 minutes. Thereafter, the combination of the microcatheter, the Zoom aspiration device was then gently retrieved and removed. No aspiration was noted at the hub of the microcatheter. Flow arrest was then reversed. A gentle control arteriogram performed through the balloon guide catheter in the distal left internal carotid artery demonstrated patent the left MCA distribution with moderately improved recanalization of the previously noted occluded inferior division branch with the clot now noted more distally in the M3 M4 junction. Also noted was a moderate vasospasm of the left middle cerebral artery M1 segment. This responded to 2.5 mg of verapamil IA, and also 2 aliquots of 25 mcg of nitroglycerin intra-arterially. Given the  increased risk of hemorrhagic complication with further mechanical thrombectomy of the small caliber of the distal occluded branch in the M3 M4 region it was not undertaken.  Additional super selective IA infusion of tPA it was not performed for the same reason. The balloon guide was then retrieved into the left common carotid artery. Arteriogram performed through this continued to demonstrate wide patency of the left internal carotid artery proximally and distally with significantly improved caliber of the left middle cerebral artery due to vasospasm. Further recanalization of the previously occluded branch was noted on this arteriogram. A TICI 2C revascularization was achieved. The balloon guide was removed. An 8 French Angio-Seal hemostasis device was applied at the right groin puncture site. Distal pulses remained Dopplerable in all 4 extremities. A CT of the brain demonstrated cortical enhancement of the left frontotemporal, and left parietal regions probably representing hyperemia. Also noted was tubular high focal density in the distal cortical vasculature probably representing a vacuum phenomenon. No mass-effect or midline shift was noted. Patient's general anesthesia was reversed and the patient was then extubated without event. The patient was able to move all 4 extremities with the right upper extremity weaker. Patient continued to be globally aphasic. She was then transferred to the neuro ICU for post revascularization care. IMPRESSION: Status post endovascular partial recanalization of occluded left middle cerebral artery inferior division branch in the mid M2 to distal M2 region to the M3 M4 region with contact aspiration and proximal aspiration achieving a TICI 2C revascularization. PLAN: Follow-up in the clinic 2-4 weeks post discharge. Electronically Signed   By: Luanne Bras M.D.   On: 04/19/2020 11:48   CT CEREBRAL PERFUSION W CONTRAST  Result Date: 04/18/2020 CLINICAL DATA:  Neuro  deficit, acute, stroke suspected. Additional history provided: Speech difficulty, right facial numbness. EXAM: CT ANGIOGRAPHY HEAD AND NECK CT PERFUSION BRAIN TECHNIQUE: Multidetector CT imaging of the head and neck was performed using the standard protocol during bolus administration of intravenous contrast. Multiplanar CT image reconstructions and MIPs were obtained to evaluate the vascular anatomy. Carotid stenosis measurements (when applicable) are obtained utilizing NASCET criteria, using the distal internal carotid diameter as the denominator. Multiphase CT imaging of the brain was performed following IV bolus contrast injection. Subsequent parametric perfusion maps were calculated using RAPID software. CONTRAST:  157mL OMNIPAQUE IOHEXOL 350 MG/ML SOLN COMPARISON:  Report from brain MRI 12/15/1998 (images unavailable). FINDINGS: CTA NECK FINDINGS Aortic arch: Standard aortic branching. The visualized aortic arch is normal in caliber. Atherosclerotic plaque within the visualized aortic arch and proximal major branch vessels of the neck. No hemodynamically significant innominate or proximal subclavian artery stenosis. Right carotid system: CCA and ICA patent within the neck without stenosis or significant atherosclerotic disease. Calcified plaque within the proximal ECA. Left carotid system: CCA and ICA patent within the neck without stenosis stenosis. Trace calcified plaque within the carotid bifurcation. Vertebral arteries: Codominant and patent within the neck without stenosis. Skeleton: Cervical spondylosis with multilevel disc space narrowing, disc bulges, uncovertebral hypertrophy. No acute bony abnormality or aggressive osseous lesion. Other neck: No neck mass or cervical lymphadenopathy. Upper chest: No consolidation within the imaged lung apices. Review of the MIP images confirms the above findings CTA HEAD FINDINGS Anterior circulation: The intracranial internal carotid arteries are patent. Mild  calcified plaque within both vessels without stenosis. The M1 middle cerebral arteries are patent. There is an occluded distal M2 left MCA branch vessel (series 10, image 52) (series 12, image 27). The anterior cerebral arteries are patent. No intracranial aneurysm is identified. Posterior circulation: The intracranial vertebral arteries are patent. The basilar artery is patent. The posterior cerebral arteries are patent. Posterior communicating  arteries are hypoplastic or absent bilaterally. Venous sinuses: Within the limitations of contrast timing, no convincing thrombus. Anatomic variants: As described Review of the MIP images confirms the above findings CT Brain Perfusion Findings: CBF (<30%) Volume: 20mL Perfusion (Tmax>6.0s) volume: 23mL (posterior left MCA vascular territory) Mismatch Volume: 1mL Infarction Location:None identified These results were called by telephone at the time of interpretation on 04/18/2020 at 10:47 am to provider [facility] , who verbally acknowledged these results. IMPRESSION: CTA neck: The common carotid, internal carotid and vertebral arteries are patent within the neck without hemodynamically significant stenosis. CTA head: 1. Occluded distal M2 left MCA branch vessel. 2. Mild non-stenotic calcified plaque within the intracranial ICAs bilaterally. CT perfusion head: The perfusion software identifies a 16 mL region of hypoperfusion within the posterior left MCA territory utilizing the Tmax>6 seconds threshold. The perfusion software identifies no core infarct. Mismatch volume: 16 mL. Electronically Signed   By: Kellie Simmering DO   On: 04/18/2020 10:59   ECHOCARDIOGRAM COMPLETE  Result Date: 04/18/2020    ECHOCARDIOGRAM REPORT   Patient Name:   Tracey [name] Date of Exam: 04/18/2020 Medical Rec #:  [id number]         Height:       65.0 in Accession #:    [id number]        Weight:       217.1 lb Date of Birth:  1955/07/04         BSA:          2.048 m Patient Age:    65 years           BP:           130/97 mmHg Patient Gender: F                 HR:           93 bpm. Exam Location:  Inpatient Procedure: 2D Echo, Cardiac Doppler and Color Doppler Indications:    Stroke  History:        Patient has prior history of Echocardiogram examinations. CAD;                 Risk Factors:Dyslipidemia and Former Smoker. GERD.  Sonographer:    Clayton Lefort RDCS (AE) Referring Phys: [id number] Karsten Fells [facility]  Sonographer Comments: No subcostal window and patient is morbidly obese. Image acquisition challenging due to patient body habitus. Unable to adjust patient position. IMPRESSIONS  1. Left ventricular ejection fraction, by estimation, is 45 to 50%. The left ventricle has mildly decreased function. The left ventricle demonstrates regional wall motion abnormalities (see scoring diagram/findings for description). Mid to apical anteroseptal akinesis. Left ventricular diastolic parameters are indeterminate.  2. Right ventricular systolic function is normal. The right ventricular size is normal. Tricuspid regurgitation signal is inadequate for assessing PA pressure.  3. The mitral valve is normal in structure. No evidence of mitral valve regurgitation.  4. The aortic valve was not well visualized. Aortic valve regurgitation is not visualized. No aortic stenosis is present. FINDINGS  Left Ventricle: Left ventricular ejection fraction, by estimation, is 45 to 50%. The left ventricle has mildly decreased function. The left ventricle demonstrates regional wall motion abnormalities. The left ventricular internal cavity size was normal in size. There is no left ventricular hypertrophy. Left ventricular diastolic parameters are indeterminate.  LV Wall Scoring: The mid and distal anterior septum is akinetic. The apex is hypokinetic. The entire anterior wall, entire lateral wall, entire inferior wall, basal  anteroseptal segment, mid inferoseptal segment, and basal inferoseptal segment are normal. Right Ventricle: The  right ventricular size is normal. No increase in right ventricular wall thickness. Right ventricular systolic function is normal. Tricuspid regurgitation signal is inadequate for assessing PA pressure. Left Atrium: Left atrial size was normal in size. Right Atrium: Right atrial size was normal in size. Pericardium: Trivial pericardial effusion is present. Mitral Valve: The mitral valve is normal in structure. No evidence of mitral valve regurgitation. Tricuspid Valve: The tricuspid valve is normal in structure. Tricuspid valve regurgitation is not demonstrated. Aortic Valve: The aortic valve was not well visualized. Aortic valve regurgitation is not visualized. No aortic stenosis is present. Aortic valve mean gradient measures 5.0 mmHg. Aortic valve peak gradient measures 9.7 mmHg. Aortic valve area, by VTI measures 1.80 cm. Pulmonic Valve: The pulmonic valve was not well visualized. Pulmonic valve regurgitation is not visualized. Aorta: The aortic root and ascending aorta are structurally normal, with no evidence of dilitation. IAS/Shunts: The interatrial septum was not well visualized.  LEFT VENTRICLE PLAX 2D LVIDd:         4.50 cm  Diastology LVIDs:         3.10 cm  LV e' medial:  8.59 cm/s LV PW:         0.90 cm  LV e' lateral: 10.40 cm/s LV IVS:        1.00 cm LVOT diam:     2.00 cm LV SV:         62 LV SV Index:   30 LVOT Area:     3.14 cm  RIGHT VENTRICLE RV Basal diam:  3.00 cm RV S prime:     12.30 cm/s TAPSE (M-mode): 2.6 cm LEFT ATRIUM             Index       RIGHT ATRIUM           Index LA diam:        3.40 cm 1.66 cm/m  RA Area:     14.60 cm LA Vol (A2C):   39.0 ml 19.04 ml/m RA Volume:   35.00 ml  17.09 ml/m LA Vol (A4C):   29.3 ml 14.30 ml/m LA Biplane Vol: 36.4 ml 17.77 ml/m  AORTIC VALVE AV Area (Vmax):    1.91 cm AV Area (Vmean):   1.93 cm AV Area (VTI):     1.80 cm AV Vmax:           156.00 cm/s AV Vmean:          110.000 cm/s AV VTI:            0.343 m AV Peak Grad:      9.7 mmHg AV  Mean Grad:      5.0 mmHg LVOT Vmax:         94.60 cm/s LVOT Vmean:        67.500 cm/s LVOT VTI:          0.197 m LVOT/AV VTI ratio: 0.57  AORTA Ao Asc diam: 3.00 cm  SHUNTS Systemic VTI:  0.20 m Systemic Diam: 2.00 cm Oswaldo Milian MD Electronically signed by Oswaldo Milian MD Signature Date/Time: 04/18/2020/8:29:19 PM    Final    IR PERCUTANEOUS ART THROMBECTOMY/INFUSION INTRACRANIAL INC DIAG ANGIO  Result Date: 04/20/2020 INDICATION: New onset global aphasia, with worsening right-sided weakness. Occluded inferior division left middle cerebral artery mid M2 segment on CT angiogram of the head and neck. EXAM: 1. EMERGENT LARGE VESSEL OCCLUSION THROMBOLYSIS (  anterior CIRCULATION) COMPARISON:  CT angiogram of the head and neck of April 18, 2020. MEDICATIONS: Ancef 2 g IV antibiotic was administered within 1 hour of the procedure. ANESTHESIA/SEDATION: General anesthesia CONTRAST:  Isovue 300 approximately 125 mL FLUOROSCOPY TIME:  Fluoroscopy Time: 24 minutes 36 seconds (1652 mGy). COMPLICATIONS: None immediate. TECHNIQUE: Following a full explanation of the procedure along with the potential associated complications, an informed witnessed consent was obtained. The risks of intracranial hemorrhage of 10%, worsening neurological deficit, ventilator dependency, death and inability to revascularize were all reviewed in detail with the patient's spouse. The patient was then put under general anesthesia by the Department of Anesthesiology at [facility]. The right groin was prepped and draped in the usual sterile fashion. Thereafter using modified Seldinger technique, transfemoral access into the right common femoral artery was obtained without difficulty. Over a 0.035 inch guidewire an 8 French 25 cm Pinnacle sheath was inserted. Through this, and also over a 0.035 inch guidewire a 5 [place] JB 1 catheter was advanced to the aortic arch region and selectively positioned in the left common carotid  artery. FINDINGS: Left common carotid arteriogram demonstrates the left external carotid artery and its major branches to be widely patent. The left internal carotid artery at the bulb to the cranial skull base is widely patent. The left middle cerebral artery opacifies into the capillary and venous phases. Occlusion of the left middle cerebral artery inferior division prominent parietal branch is seen in the mid M2 to distal M2 region. This is associated with a moderate sized wedge shaped hypo perfusion defect. Left anterior cerebral artery opacifies into the capillary and venous phases. PROCEDURE: Diagnostic 5 [place] JB 1 catheter in the left common carotid artery was then exchanged for a 0.035 inch 300 cm Rosen exchange guidewire for an 087 balloon guide catheter which had been prepped with 50% contrast and 50% heparinized saline infusion and advanced to the mid cervical left ICA. The guidewire was removed. Good aspiration obtained from the hub of the balloon guide catheter. Through this, and over a 0.014 inch Synchro standard micro guidewire a combination of a 136 cm 071 Zoom aspiration catheter and an 021 162 cm Trevo microcatheter was advanced to the supraclinoid left ICA. Micro guidewire was then gently manipulated with a torque device and advanced without difficulty through the occluded mid to distal M2 segment with a wire and the microcatheter being advanced to the M3 M4 junction. The micro guidewire was removed. There was slow aspiration of blood. A gentle contrast injection through the microcatheter demonstrated slow antegrade flow distal to the microcatheter. The micro guidewire was reintroduced and advanced further distally with the microcatheter also advanced slightly distally. The guidewire was removed. Again there was moderately improved aspiration of blood at the hub of the microcatheter which was then connected to continuous heparinized saline infusion. The 071 Zoom aspiration catheter was advanced  just inside the prominent proximal inferior division. With proximal flow arrest slow aspiration was performed with a 20 mL syringe at hub of the microcatheter, and with Penumbra aspiration device at the hub of the Zoom aspiration catheter for approximately 1-1/2 minutes. Thereafter, the combination of the microcatheter, the Zoom aspiration device was then gently retrieved and removed. No aspiration was noted at the hub of the microcatheter. Flow arrest was then reversed. A gentle control arteriogram performed through the balloon guide catheter in the distal left internal carotid artery demonstrated patent the left MCA distribution with moderately improved recanalization of the previously noted  occluded inferior division branch with the clot now noted more distally in the M3 M4 junction. Also noted was a moderate vasospasm of the left middle cerebral artery M1 segment. This responded to 2.5 mg of verapamil IA, and also 2 aliquots of 25 mcg of nitroglycerin intra-arterially. Given the increased risk of hemorrhagic complication with further mechanical thrombectomy of the small caliber of the distal occluded branch in the M3 M4 region it was not undertaken. Additional super selective IA infusion of tPA it was not performed for the same reason. The balloon guide was then retrieved into the left common carotid artery. Arteriogram performed through this continued to demonstrate wide patency of the left internal carotid artery proximally and distally with significantly improved caliber of the left middle cerebral artery due to vasospasm. Further recanalization of the previously occluded branch was noted on this arteriogram. A TICI 2C revascularization was achieved. The balloon guide was removed. An 8 French Angio-Seal hemostasis device was applied at the right groin puncture site. Distal pulses remained Dopplerable in all 4 extremities. A CT of the brain demonstrated cortical enhancement of the left frontotemporal, and left  parietal regions probably representing hyperemia. Also noted was tubular high focal density in the distal cortical vasculature probably representing a vacuum phenomenon. No mass-effect or midline shift was noted. Patient's general anesthesia was reversed and the patient was then extubated without event. The patient was able to move all 4 extremities with the right upper extremity weaker. Patient continued to be globally aphasic. She was then transferred to the neuro ICU for post revascularization care. IMPRESSION: Status post endovascular partial recanalization of occluded left middle cerebral artery inferior division branch in the mid M2 to distal M2 region to the M3 M4 region with contact aspiration and proximal aspiration achieving a TICI 2C revascularization. PLAN: Follow-up in the clinic 2-4 weeks post discharge. Electronically Signed   By: Luanne Bras M.D.   On: 04/19/2020 11:48   CT HEAD CODE STROKE WO CONTRAST  Addendum Date: 04/18/2020   ADDENDUM REPORT: 04/18/2020 11:12 ADDENDUM: Small amount of white matter hypodensity within the posterior left frontal lobe. In the setting of an acute distal left M2 MCA branch occlusion, this may reflect a small acute white matter infarct or chronic small vessel ischemic disease. These results were called by telephone at the time of interpretation on 04/18/2020 at 11:11 am to provider Dr. Curly Shores, who verbally acknowledged these results. Electronically Signed   By: Kellie Simmering DO   On: 04/18/2020 11:12   Addendum Date: 04/18/2020   ADDENDUM REPORT: 04/18/2020 10:30 ADDENDUM: These results were called by telephone at the time of interpretation on 04/18/2020 at 10:28 am to provider [facility] , who verbally acknowledged these results. Electronically Signed   By: Kellie Simmering DO   On: 04/18/2020 10:30   Result Date: 04/18/2020 CLINICAL DATA:  Code stroke.  Slurred speech. EXAM: CT HEAD WITHOUT CONTRAST TECHNIQUE: Contiguous axial images were obtained from the  base of the skull through the vertex without intravenous contrast. COMPARISON:  Report from brain MRI 12/15/1998 (images unavailable). FINDINGS: Brain: Cerebral volume is normal for age. Mild patchy and ill-defined hypoattenuation within the cerebral white matter is nonspecific, but compatible chronic small vessel ischemic disease. There is no acute intracranial hemorrhage. No demarcated cortical infarct. No extra-axial fluid collection. No evidence of intracranial mass. No midline shift. Partially empty sella turcica. Vascular: No hyperdense vessel.  Atherosclerotic calcifications Skull: Normal. Negative for fracture or focal lesion. Sinuses/Orbits: Visualized orbits show no  acute finding. Small volume frothy secretions within the right sphenoid sinus. ASPECTS [facility] Stroke Program Early CT Score) - Ganglionic level infarction (caudate, lentiform nuclei, internal capsule, insula, M1-M3 cortex): 7 - Supraganglionic infarction (M4-M6 cortex): 3 Total score (0-10 with 10 being normal): 10 IMPRESSION: No evidence of acute intracranial abnormality.  ASPECTS is 10. Mild cerebral white matter chronic small vessel ischemic disease. Right sphenoid sinusitis. Electronically Signed: By: Kellie Simmering DO On: 04/18/2020 10:26   VAS [place] LOWER EXTREMITY VENOUS (DVT)  Result Date: 04/19/2020  Lower Venous DVT Study Indications: Stroke.  Comparison Study: No prior Performing Technologist: Oda Cogan RDMS, RVT  Examination Guidelines: A complete evaluation includes B-mode imaging, spectral Doppler, color Doppler, and power Doppler as needed of all accessible portions of each vessel. Bilateral testing is considered an integral part of a complete examination. Limited examinations for reoccurring indications may be performed as noted. The reflux portion of the exam is performed with the patient in reverse Trendelenburg.  +---------+---------------+---------+-----------+----------+--------------+ RIGHT     CompressibilityPhasicitySpontaneityPropertiesThrombus Aging +---------+---------------+---------+-----------+----------+--------------+ CFV      Full           Yes      Yes                                 +---------+---------------+---------+-----------+----------+--------------+ SFJ      Full                                                        +---------+---------------+---------+-----------+----------+--------------+ FV Prox  Full                                                        +---------+---------------+---------+-----------+----------+--------------+ FV Mid   Full                                                        +---------+---------------+---------+-----------+----------+--------------+ FV DistalFull                                                        +---------+---------------+---------+-----------+----------+--------------+ PFV      Full                                                        +---------+---------------+---------+-----------+----------+--------------+ POP      Full           Yes      Yes                                 +---------+---------------+---------+-----------+----------+--------------+ PTV  Full                                                        +---------+---------------+---------+-----------+----------+--------------+ PERO     Full                                                        +---------+---------------+---------+-----------+----------+--------------+   +---------+---------------+---------+-----------+----------+--------------+ LEFT     CompressibilityPhasicitySpontaneityPropertiesThrombus Aging +---------+---------------+---------+-----------+----------+--------------+ CFV      Full           Yes      Yes                                 +---------+---------------+---------+-----------+----------+--------------+ SFJ      Full                                                         +---------+---------------+---------+-----------+----------+--------------+ FV Prox  Full                                                        +---------+---------------+---------+-----------+----------+--------------+ FV Mid   Full                                                        +---------+---------------+---------+-----------+----------+--------------+ FV DistalFull                                                        +---------+---------------+---------+-----------+----------+--------------+ PFV      Full                                                        +---------+---------------+---------+-----------+----------+--------------+ POP      Full           Yes      Yes                                 +---------+---------------+---------+-----------+----------+--------------+ PTV      Full                                                        +---------+---------------+---------+-----------+----------+--------------+  PERO     Full                                                        +---------+---------------+---------+-----------+----------+--------------+     Summary: BILATERAL: - No evidence of deep vein thrombosis seen in the lower extremities, bilaterally. -No evidence of popliteal cyst, bilaterally.   *See table(s) above for measurements and observations. Electronically signed by Monica Martinez MD on 04/19/2020 at 6:23:13 PM.    Final     Labs:  CBC: Recent Labs    04/18/20 1010 04/19/20 0500 04/20/20 0052  WBC 4.2 9.3 9.3  HGB 11.9* 13.0 11.8*  HCT 36.6 39.9 36.2  PLT 195 238 237    COAGS: Recent Labs    04/18/20 1010  INR 0.9  APTT 29    BMP: Recent Labs    04/18/20 1010 04/19/20 0500 04/20/20 0052  NA 135 140 137  K 4.0 3.5 3.4*  CL 103 111 106  CO2 26 23 24   GLUCOSE 111* 128* 96  BUN 18 12 18   CALCIUM 8.5* 8.5* 8.4*  CREATININE 0.91 0.80 0.88  GFRNONAA >60 >60 >60    LIVER  FUNCTION TESTS: Recent Labs    04/18/20 1010 04/19/20 0500  BILITOT 0.5 0.7  AST 18 20  ALT 19 16  ALKPHOS 46 46  PROT 6.3* 6.0*  ALBUMIN 3.4* 3.1*    Assessment and Plan:  History of CVA s/p cerebral arteriogram and partial revascularization of left MCA inferior division distal M2 to distal M3/M4 junction on 04/18/20 with Dr. Estanislado Pandy.  Patient laying bed with family members and neurology team at the bedside.  Patient is alert ans oriented, and speech has improved.  Able to follow commands and able to move all extremities freely.   Further plan per neurology  Appreciate and agree with the treatment  Please call NIR for concerns and questions  Electronically Signed: Tera Mater, PA-C 04/20/2020, 11:03 AM   I spent a total of 15 Minutes at the the patient's bedside AND on the patient's hospital floor or unit, greater than 50% of which was counseling/coordinating care for CVA s/p partial revascularization

## 2020-04-20 NOTE — Progress Notes (Signed)
Pt arrived to 5w31 from 4N. Pt is A&Ox2 severe expressive apashia. Pt has bruising to sacrum, bilateral arms and bilateral legs. Pt has abrasion to bilateral arms. Oriented pt to room and call bell. Pt verbalized understanding of call bell. Pt placed on bed alarm and telemetry. Called telemetry and notified them that pt had transferred from 4N. Will continue to monitor pt. Racheal Patches RN

## 2020-04-20 NOTE — Progress Notes (Addendum)
STROKE TEAM PROGRESS NOTE   INTERVAL HISTORY No acute events overnight.  Her husband and daughter are at the bedside.  She is much improved from yesterday. She is able to understand everything said to her and able to answer all questions. She still has some difficulty finding words and with speech. She states she was at work and became weak and unable to walk up the stairs. Her coworkers found her and called EMS. Discussed Cardiology will do her TEE on Monday. Discussed given her improvement and if it continues she may be able to do outpatient therapy but if not will continue with the plan to go to CIR.  IR is following. Cardiology is consulted.  Vitals:   04/19/20 2300 04/19/20 2336 04/20/20 0400 04/20/20 0746  BP: 114/60 (!) 115/59 (!) 107/59 112/89  Pulse: 95 86 96 88  Resp: 11 14 12 16   Temp:  97.8 F (36.6 C) 97.8 F (36.6 C) 98.1 F (36.7 C)  TempSrc:  Oral Oral Oral  SpO2: 93% 95% 95% 96%  Weight:  97.6 kg     CBC:  Recent Labs  Lab 04/18/20 1010 04/19/20 0500 04/20/20 0052  WBC 4.2 9.3 9.3  NEUTROABS 2.4 8.3*  --   HGB 11.9* 13.0 11.8*  HCT 36.6 39.9 36.2  MCV 86.5 86.4 87.7  PLT 195 238 119   Basic Metabolic Panel:  Recent Labs  Lab 04/19/20 0500 04/20/20 0052  NA 140 137  K 3.5 3.4*  CL 111 106  CO2 23 24  GLUCOSE 128* 96  BUN 12 18  CREATININE 0.80 0.88  CALCIUM 8.5* 8.4*   Lipid Panel:  Recent Labs  Lab 04/19/20 0500  CHOL 135  TRIG 55  HDL 73  CHOLHDL 1.8  VLDL 11  LDLCALC 51   HgbA1c:  Recent Labs  Lab 04/19/20 0500  HGBA1C 5.4   Urine Drug Screen:  Recent Labs  Lab 04/18/20 1010  LABOPIA NONE DETECTED  COCAINSCRNUR NONE DETECTED  LABBENZ NONE DETECTED  AMPHETMU NONE DETECTED  THCU NONE DETECTED  LABBARB NONE DETECTED    Alcohol Level  Recent Labs  Lab 04/18/20 1010  ETH <10    IMAGING past 24 hours CT HEAD WO CONTRAST  Result Date: 04/19/2020 CLINICAL DATA:  Stroke post tPA and thrombectomy EXAM: CT HEAD WITHOUT  CONTRAST TECHNIQUE: Contiguous axial images were obtained from the base of the skull through the vertex without intravenous contrast. COMPARISON:  [place] FINDINGS: Brain: There is no acute intracranial hemorrhage. A more well-defined area of hypoattenuation is present in the left parietal lobe. No mass effect. Ventricles are stable in size. Vascular: No new findings. Skull: Calvarium is unremarkable. Sinuses/Orbits: No acute finding. Other: None. IMPRESSION: No acute hemorrhage. Small more well-defined area of evolving acute infarction is present in the left parietal lobe. Electronically Signed   By: Macy Mis M.D.   On: 04/19/2020 11:53   VAS [place] LOWER EXTREMITY VENOUS (DVT)  Result Date: 04/19/2020  Lower Venous DVT Study Indications: Stroke.  Comparison Study: No prior Performing Technologist: Oda Cogan RDMS, RVT  Examination Guidelines: A complete evaluation includes B-mode imaging, spectral Doppler, color Doppler, and power Doppler as needed of all accessible portions of each vessel. Bilateral testing is considered an integral part of a complete examination. Limited examinations for reoccurring indications may be performed as noted. The reflux portion of the exam is performed with the patient in reverse Trendelenburg.  +---------+---------------+---------+-----------+----------+--------------+ RIGHT    CompressibilityPhasicitySpontaneityPropertiesThrombus Aging +---------+---------------+---------+-----------+----------+--------------+ CFV  Full           Yes      Yes                                 +---------+---------------+---------+-----------+----------+--------------+ SFJ      Full                                                        +---------+---------------+---------+-----------+----------+--------------+ FV Prox  Full                                                        +---------+---------------+---------+-----------+----------+--------------+ FV Mid    Full                                                        +---------+---------------+---------+-----------+----------+--------------+ FV DistalFull                                                        +---------+---------------+---------+-----------+----------+--------------+ PFV      Full                                                        +---------+---------------+---------+-----------+----------+--------------+ POP      Full           Yes      Yes                                 +---------+---------------+---------+-----------+----------+--------------+ PTV      Full                                                        +---------+---------------+---------+-----------+----------+--------------+ PERO     Full                                                        +---------+---------------+---------+-----------+----------+--------------+   +---------+---------------+---------+-----------+----------+--------------+ LEFT     CompressibilityPhasicitySpontaneityPropertiesThrombus Aging +---------+---------------+---------+-----------+----------+--------------+ CFV      Full           Yes      Yes                                 +---------+---------------+---------+-----------+----------+--------------+  SFJ      Full                                                        +---------+---------------+---------+-----------+----------+--------------+ FV Prox  Full                                                        +---------+---------------+---------+-----------+----------+--------------+ FV Mid   Full                                                        +---------+---------------+---------+-----------+----------+--------------+ FV DistalFull                                                        +---------+---------------+---------+-----------+----------+--------------+ PFV      Full                                                         +---------+---------------+---------+-----------+----------+--------------+ POP      Full           Yes      Yes                                 +---------+---------------+---------+-----------+----------+--------------+ PTV      Full                                                        +---------+---------------+---------+-----------+----------+--------------+ PERO     Full                                                        +---------+---------------+---------+-----------+----------+--------------+     Summary: BILATERAL: - No evidence of deep vein thrombosis seen in the lower extremities, bilaterally. -No evidence of popliteal cyst, bilaterally.   *See table(s) above for measurements and observations. Electronically signed by Monica Martinez MD on 04/19/2020 at 6:23:13 PM.    Final     PHYSICAL EXAM Blood pressure 112/89, pulse 88, temperature 98.1 F (36.7 C), temperature source Oral, resp. rate 16, weight 97.6 kg, SpO2 96 %.  General: alert and awake, overweight caucasian female, no apparent distress  Lungs: Symmetrical Chest rise, no labored breathing  Cardio: Regular Rate and Rhythm  Abdomen: Soft, non-tender  Neuro: Alert, oriented, thought content  appropriate.  Speech improved but still has trouble finding words and speaking fluently.  Able to follow all commands without difficulty. Cranial Nerves: II:  Visual fields grossly normal, pupils equal, round, reactive to light and accommodation III,IV, VI: ptosis not present, extra-ocular motions intact bilaterally V,VII: smile symmetric, facial light touch sensation normal bilaterally VIII: hearing normal bilaterally IX,X: uvula rises symmetrically XI: bilateral shoulder shrug XII: midline tongue extension without atrophy or fasciculations  Motor: Right : Upper extremity   5/5    Left:     Upper extremity   5/5  Lower extremity   5/5     Lower extremity   5/5 Tone and bulk:normal tone  throughout; no atrophy noted Sensory: light touch intact throughout, bilaterally Cerebellar: normal finger-to-nose Gait: deferred    ASSESSMENT/PLAN Tracey [name] is a 65 y.o. female with history of CAD s/p MI and cardiac arrest with stentins 04/2008, restenting 11/2018, claudication, HTN, hypothryoidism, HLD, and anxiety presenting with aphasia. She was at work and seem to go into the bathroom at 9:15 AM. Later around 9:20 AM she was found on the floor aphasic and with some right-sided weakness. She was already improving on EMS arrival and they noted her blood pressure to be in the 140s over 60s with a glucose of 100.Patient and husband deny any recent focal neurological symptoms or signs or symptoms of infection or signs and symptoms of bleeding. Husband does note she has generally been complaining of reduced hearing for some time but the patient reported acute onset left sided hearing loss today at the same time as her other symptoms began. She received tPA at [facility] and was transferred to [facility] and underwent revascularization by IR.   DVT US showed no evidence of DVT. She has not had her MRA yet. She has improved significantly since yesterday. Will continue to monitor her improvement through Monday when she gets her TEE and loop recorder. Currently she is being recommended to go to CIR, however, if she continues to improve she may be able to do outpatient therapy.  Stroke - Left MCA M2 branch stroke secondary to large vessel disease vs. cardioembolic source  CT head Code Stroke - Small amount of white matter hypodensity within the posterior left frontal lobe. In the setting of an acute distal left M2 MCA branch occlusion, this may reflect a small acute white matter infarct or chronic small vessel ischemic disease.  CT head - No evidence of acute intracranial hemorrhage status post tPA. No demarcated cortical infarct. As before, there is subtle hypodensity within the posterior left  frontal lobe white matter. In the setting of an acute distal left M2 MCA branch occlusion, this may reflect a small acute white matter infarct or chronic small vessel ischemic disease. Stable background mild cerebral white matter chronic small vessel ischemic disease. Right sphenoid sinusitis.  CTA head & neck - Occluded distal M2 left MCA branch vessel. Mild non-stenotic calcified plaque within the intracranial ICAs bilaterally.  CT perfusion - The perfusion software identifies a 16 mL region of hypoperfusion within the posterior left MCA territory utilizing the Tmax>6 seconds threshold. The perfusion software identifies no core infarct. Mismatch volume: 16 mL.  MRA - Ordered  2D Echo - EF:45-50%. No wall motion abnormalities. Mid to apical anteroseptal akinesis.  DVT US - No evidence of DVT bilaterally  TEE and loop recorder pending Monday  LDL 51  HgbA1c 5.4  VTE prophylaxis - SCD's     Diet     Diet  regular Room service appropriate? Yes with Assist; Fluid consistency: Thin         aspirin 81 mg daily prior to admission, now on aspirin 81 mg daily and clopidogrel 75 mg daily for 3 weeks then continue with Plavix alone.  Therapy recommendations:  outpt PT  Disposition:  Pending  Hypertension  Home meds:  Ramipril, Metoprolol  Currently on Cleviprex  Stable  Long-term BP goal normotensive  Hyperlipidemia  Home meds:  Crestor 40 mg, resumed in hospital  LDL 51, goal < 70  She is currently at goal will not change at this time.  Continue statin at discharge  Other Stroke Risk Factors  Former Cigarette smoker stopped after MI  Obesity, There is no height or weight on file to calculate BMI., BMI >/= 30 associated with increased stroke risk, recommend weight loss, diet and exercise as appropriate   Family hx stroke (Maternal Grandmother- Stroke)  Coronary artery disease  MI in 2010  Other Active Problems  Hypothyroidism - Levothyroxine 75  mcg   Hospital day # 2  Fatima Sanger MD Resident  ATTENDING NOTE: I reviewed above note and agree with the assessment and plan. Pt was seen and examined.   Patient husband and daughter are at bedside.  Patient lying in bed, speech much improved, able to speak in sentences wheeze intermittent paraphasic errors and word finding difficulty.  Much improved from yesterday.  Follow all simple and two-step commands.  In good spirits and make jokes.  No significant facial droop, moving all extremities symmetrically, sensation symmetrical, finger-to-nose intact.  MRI showed left parietal cortical infarct, MRI showed preserved but still decreased flow at distal left M2/M3.  Given her risk factors, stroke could be due to large vessel atherosclerosis versus cardioembolic source. On aspirin and Plavix DAPT and Crestor 40.  Pending TEE and loop recorder on Monday.  PT now recommend outpatient PT.  For detailed assessment and plan, please refer to above as I have made changes wherever appropriate.   [name], MD PhD Stroke Neurology 04/20/2020 6:19 PM    To contact Stroke Continuity provider, please refer to [email or web address] After hours, contact General Neurology

## 2020-04-21 LAB — CBC
HCT: 35.2 % — ABNORMAL LOW (ref 36.0–46.0)
Hemoglobin: 11.4 g/dL — ABNORMAL LOW (ref 12.0–15.0)
MCH: 28.4 pg (ref 26.0–34.0)
MCHC: 32.4 g/dL (ref 30.0–36.0)
MCV: 87.6 fL (ref 80.0–100.0)
Platelets: 205 10*3/uL (ref 150–400)
RBC: 4.02 MIL/uL (ref 3.87–5.11)
RDW: 15.4 % (ref 11.5–15.5)
WBC: 7.6 10*3/uL (ref 4.0–10.5)
nRBC: 0 % (ref 0.0–0.2)

## 2020-04-21 LAB — BASIC METABOLIC PANEL
Anion gap: 8 (ref 5–15)
BUN: 18 mg/dL (ref 8–23)
CO2: 25 mmol/L (ref 22–32)
Calcium: 8.7 mg/dL — ABNORMAL LOW (ref 8.9–10.3)
Chloride: 107 mmol/L (ref 98–111)
Creatinine, Ser: 0.81 mg/dL (ref 0.44–1.00)
GFR, Estimated: >60 mL/min (ref >60)
Glucose, Bld: 98 mg/dL (ref 70–99)
Potassium: 3.4 mmol/L — ABNORMAL LOW (ref 3.5–5.1)
Sodium: 140 mmol/L (ref 135–145)

## 2020-04-21 LAB — TROPONIN I (HIGH SENSITIVITY)
Troponin I (High Sensitivity): 10 ng/L (ref <18)
Troponin I (High Sensitivity): 10 ng/L (ref <18)

## 2020-04-21 NOTE — Progress Notes (Signed)
Occupational Therapy Treatment Patient Details Name: Tracey [name] MRN: [id number] DOB: [birth year] Today's Date: 04/21/2020    History of present illness The pt is a 65 yo female presenting 3/23 with slurred speech and expressive aphasia. Imaging revealed L distal MCA occlusion, and pt is now s/p partial revascularization on 3/23. PMH includes: HLD, PAD, GERD, MI (s/p stent placement and cardiac rehab, followed by [facility]), CAD.   OT comments  Pt making steady progress towards OT goals this session. Session focus on functional mobility in conjunction with cognitive tasks as well as light therex to increase RUE coordination. Pt making progress with functional mobility with pt able to 2 laps on unit with no AD and supervision, pt able to complete cog task of naming words in relation to letters of alphabet during mobility ( see cog section). Pt additionally completing toileting and grooming tasks upon arrival with supervision with no AD. Worked on [employer] therex with RUE with pt demonstrating improvement from previous OT session. Pt also able to write her name, her husbands name and simple words with R hand. Updated DC recs in light of progression as indicated below. Will continue to follow acutely per POC.    Follow Up Recommendations  No OT follow up;Supervision/Assistance - 24 hour    Equipment Recommendations  Tub/shower seat    Recommendations for Other Services      Precautions / Restrictions Precautions Precautions: Fall Restrictions Weight Bearing Restrictions: No       Mobility Bed Mobility               General bed mobility comments: pt up in room upon arrival, returned pt to EOB at end of session    Transfers Overall transfer level: Needs assistance Equipment used: None Transfers: Sit to/from Stand Sit to Stand: Supervision         General transfer comment: Supervision for safety    Balance Overall balance assessment: Needs assistance Sitting-balance support:  No upper extremity supported;Feet supported Sitting balance-Leahy Scale: Normal     Standing balance support: No upper extremity supported Standing balance-Leahy Scale: Good                             ADL either performed or assessed with clinical judgement   ADL Overall ADL's : Needs assistance/impaired     Grooming: Wash/dry hands;Standing;Supervision/safety Grooming Details (indicate cue type and reason): supervision for safety, no AD or LOB noted. pt additionally able to complete dynamic tasks with no UE support                 Toilet Transfer: Supervision/safety;Ambulation;Regular Glass blower/designer Details (indicate cue type and reason): pt exiting BR upon COTA arrival with family present providing only supervision Toileting- Water quality scientist and Hygiene: Supervision/safety;Sit to/from Nurse, children's Details (indicate cue type and reason): pt reports walkin shower, education provided on recommendation for shower seat for safety and fall prevention Functional mobility during ADLs: Supervision/safety General ADL Comments: pt presenting with improvements in balance ( pt completed standing grooming tasks at sink with no UE support, AD or LOB), attention to R side ( pt ambulating in hallway with no issues navigating around obstacles on R side) and cognition ( pt engaging in cog task where pt instructed to name words that correlated with correct letter of the alphabet).     Vision   Additional Comments: no visual deficits noted during session   Perception  Praxis      Cognition Arousal/Alertness: Awake/alert Behavior During Therapy: WFL for tasks assessed/performed Overall Cognitive Status: Difficult to assess                                 General Comments: laughing and joking appropriately during session. pt completed cog task where pt instructed to state words related to letter of alphabet pt had difficulty at  first needing assist with letter " B" but then able to state words appropriately. noted difficulty with phonating correctly as pt would state "rantula" instead of "tarantula" pt also able to write her name, her husbands name and the word "dog"         Exercises Hand Exercises Digit Composite Flexion: AROM;Right;5 reps;Seated Composite Extension: AROM;Right;5 reps;Seated Digit Composite Abduction: AROM;Right;Seated Digit Composite Adduction: AROM;Right;Seated Digit Lifts: AROM;Right;Seated;5 reps Opposition: AROM;Right;5 reps;Seated   Shoulder Instructions       General Comments  pt HR increase to as much as 159 bpm with mobility but quickly returned to [facility] with standing rest break.     Pertinent Vitals/ Pain       Pain Assessment: No/denies pain  Home Living                                          Prior Functioning/Environment              Frequency  Min 2X/week        Progress Toward Goals  OT Goals(current goals can now be found in the care plan section)  Progress towards OT goals: Progressing toward goals  Acute Rehab OT Goals Patient Stated Goal: return home Time For Goal Achievement: 05/03/20 Potential to Achieve Goals: Good  Plan Discharge plan needs to be updated;Equipment recommendations need to be updated;Frequency needs to be updated    Co-evaluation                 AM-PAC OT "6 Clicks" Daily Activity     Outcome Measure   Help from another person eating meals?: None Help from another person taking care of personal grooming?: None Help from another person toileting, which includes using toliet, bedpan, or urinal?: None Help from another person bathing (including washing, rinsing, drying)?: A Little Help from another person to put on and taking off regular upper body clothing?: None Help from another person to put on and taking off regular lower body clothing?: None 6 Click Score: 23    End of Session    OT Visit  Diagnosis: Unsteadiness on feet (R26.81);Other abnormalities of gait and mobility (R26.89);Muscle weakness (generalized) (M62.81)   Activity Tolerance Patient tolerated treatment well   Patient Left in bed;with call bell/phone within reach;with family/visitor present;Other (comment) (sitting EOB)   Nurse Communication Mobility status        Time: 1427-1450 OT Time Calculation (min): 23 min  Charges: OT General Charges $OT Visit: 1 Visit OT Treatments $Therapeutic Activity: 23-37 mins  Harley Alto., COTA/L Acute Rehabilitation Services [place] 04/21/2020, 4:19 PM

## 2020-04-21 NOTE — Progress Notes (Addendum)
STROKE TEAM PROGRESS NOTE   INTERVAL HISTORY Her husband and daughter are at the bedside.  She reported a brief episode of dizziness, headache, and chest pain. It was just to the left of midline of her chest. She reports the pain was centered there, it did not travel to her back or circle in a band around her chest. She reports the symptoms quickly resolved.  She reports that she does have a history of panic attacks that started after her MI 12 years ago. She reports that her speech has improved and if she speaks slower it improves. She reports difficulty reading large amounts of text but that this also is improving as she can read read small amounts of text.  Cardiology is following for TEE and loop recorder on Monday. Vitals:   04/20/20 2005 04/20/20 2352 04/21/20 0436 04/21/20 0729  BP: 118/64 (!) 112/58 110/63 (!) 112/58  Pulse: 75 94 84 85  Resp: 18 11 16 19   Temp: 97.7 F (36.5 C) 98.4 F (36.9 C) 98 F (36.7 C) 98.3 F (36.8 C)  TempSrc: Oral Oral Oral Oral  SpO2: 100% 100% 100% 100%  Weight:   97.8 kg    CBC:  Recent Labs  Lab 04/18/20 1010 04/19/20 0500 04/20/20 0052 04/21/20 0056  WBC 4.2 9.3 9.3 7.6  NEUTROABS 2.4 8.3*  --   --   HGB 11.9* 13.0 11.8* 11.4*  HCT 36.6 39.9 36.2 35.2*  MCV 86.5 86.4 87.7 87.6  PLT 195 238 237 644   Basic Metabolic Panel:  Recent Labs  Lab 04/20/20 0052 04/21/20 0056  NA 137 140  K 3.4* 3.4*  CL 106 107  CO2 24 25  GLUCOSE 96 98  BUN 18 18  CREATININE 0.88 0.81  CALCIUM 8.4* 8.7*   Lipid Panel:  Recent Labs  Lab 04/19/20 0500  CHOL 135  TRIG 55  HDL 73  CHOLHDL 1.8  VLDL 11  LDLCALC 51   HgbA1c:  Recent Labs  Lab 04/19/20 0500  HGBA1C 5.4   Urine Drug Screen:  Recent Labs  Lab 04/18/20 1010  LABOPIA NONE DETECTED  COCAINSCRNUR NONE DETECTED  LABBENZ NONE DETECTED  AMPHETMU NONE DETECTED  THCU NONE DETECTED  LABBARB NONE DETECTED    Alcohol Level  Recent Labs  Lab 04/18/20 1010  ETH <10     IMAGING past 24 hours MR ANGIO HEAD WO CONTRAST  Result Date: 04/20/2020 CLINICAL DATA:  Stroke post tPA and thrombectomy EXAM: MRI HEAD WITHOUT CONTRAST MRA HEAD WITHOUT CONTRAST TECHNIQUE: Multiplanar, multiecho pulse sequences of the brain and surrounding structures were obtained without intravenous contrast. Angiographic images of the head were obtained using MRA technique without contrast. COMPARISON:  None. FINDINGS: MRI HEAD Brain: Moderate size area of reduced diffusion is present in the left parietal lobe spanning cortex to posterior left lateral ventricle. Few additional areas of left parietal cortical infarction. No evidence of hemorrhagic transformation. No significant mass effect. Additional patchy foci of T2 hyperintensity in the supratentorial white matter are nonspecific but may reflect mild chronic microvascular ischemic changes. Ventricles and sulci are within normal limits in size and configuration. There is no intracranial mass. There is no hydrocephalus or extra-axial fluid collection. Vascular: Major vessel flow voids at the skull base are preserved. Skull and upper cervical spine: Normal marrow signal is preserved. Sinuses/Orbits: Paranasal sinuses are aerated. Orbits are unremarkable. Other: Sella is unremarkable.  Mastoid air cells are clear. MRA HEAD Intracranial internal carotid arteries are patent. Prox middle and anterior  cerebral arteries are patent. Decreased flow related enhancement within the distal left M2/M3 at the site of thrombus on prior CTA. Intracranial vertebral arteries, basilar artery, proximal posterior cerebral arteries are patent. 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 Minimal additional small acute left parietal cortical infarcts. No evidence of hemorrhagic transformation. Mild chronic microvascular ischemic changes. Preserved but decreased flow related enhancement at the site of distal left M2/M3 MCA thrombus on prior CTA.  Electronically Signed   By: Macy Mis M.D.   On: 04/20/2020 15:00   MR BRAIN WO CONTRAST  Result Date: 04/20/2020 CLINICAL DATA:  Stroke post tPA and thrombectomy EXAM: MRI HEAD WITHOUT CONTRAST MRA HEAD WITHOUT CONTRAST TECHNIQUE: Multiplanar, multiecho pulse sequences of the brain and surrounding structures were obtained without intravenous contrast. Angiographic images of the head were obtained using MRA technique without contrast. COMPARISON:  None. FINDINGS: MRI HEAD Brain: Moderate size area of reduced diffusion is present in the left parietal lobe spanning cortex to posterior left lateral ventricle. Few additional areas of left parietal cortical infarction. No evidence of hemorrhagic transformation. No significant mass effect. Additional patchy foci of T2 hyperintensity in the supratentorial white matter are nonspecific but may reflect mild chronic microvascular ischemic changes. Ventricles and sulci are within normal limits in size and configuration. There is no intracranial mass. There is no hydrocephalus or extra-axial fluid collection. Vascular: Major vessel flow voids at the skull base are preserved. Skull and upper cervical spine: Normal marrow signal is preserved. Sinuses/Orbits: Paranasal sinuses are aerated. Orbits are unremarkable. Other: Sella is unremarkable.  Mastoid air cells are clear. MRA HEAD Intracranial internal carotid arteries are patent. Prox middle and anterior cerebral arteries are patent. Decreased flow related enhancement within the distal left M2/M3 at the site of thrombus on prior CTA. Intracranial vertebral arteries, basilar artery, proximal posterior cerebral arteries are patent. 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 Minimal additional small acute left parietal cortical infarcts. No evidence of hemorrhagic transformation. Mild chronic microvascular ischemic changes. Preserved but decreased flow related enhancement at the site of distal  left M2/M3 MCA thrombus on prior CTA. Electronically Signed   By: Macy Mis M.D.   On: 04/20/2020 15:00    PHYSICAL EXAM Blood pressure (!) 112/58, pulse 85, temperature 98.3 F (36.8 C), temperature source Oral, resp. rate 19, weight 97.8 kg, SpO2 100 %.  General: alert and awake, caucasian female, no apparent distress  Lungs: Symmetrical Chest rise, no labored breathing  Cardio: Regular Rate and Rhythm  Abdomen: Soft, non-tender  Neuro: Alert, oriented, thought content appropriate.  Expressive Aphasia still present but improved. Alexia present but improving.  Able to follow all commands without difficulty.  Cranial Nerves: II:  Visual fields grossly normal, pupils equal, round, reactive to light and accommodation III,IV, VI: ptosis not present, extra-ocular motions intact bilaterally V,VII: smile symmetric, facial light touch sensation normal bilaterally VIII: hearing normal bilaterally IX,X: uvula rises symmetrically XI: bilateral shoulder shrug XII: midline tongue extension without atrophy or fasciculations  Motor: Right : Upper extremity   5/5    Left:     Upper extremity   5/5  Lower extremity   5/5     Lower extremity   5/5 Tone and bulk:normal tone throughout; no atrophy noted Sensory: light touch intact throughout, bilaterally Cerebellar: normal finger-to-nose Gait: deferred    ASSESSMENT/PLAN Tracey [name] is a 65 y.o. female with history of CAD s/p MI and cardiac arrest with stentins 04/2008, restenting  11/2018, claudication, HTN, hypothryoidism, HLD, and anxietypresenting with aphasia.She was at work and seem to go into the bathroom at 9:15 AM. Later around 9:20 AM she was found on the floor aphasic and with some right-sided weakness. She was already improving on EMS arrival and they noted her blood pressure to be in the 140s over 60s with a glucose of 100.Patient and husband deny any recent focal neurological symptoms or signs or symptoms of  infection or signs and symptoms of bleeding. Husband does note she has generally been complaining of reduced hearing for some time but the patient reported acute onset left sided hearing loss today at the same time as her other symptoms began.She received tPA at [facility] and was transferred to [facility] and underwent revascularization by IR.   After her episode of chest pain her EKG was normal. Given her history of Panic attacks this is most likely there cause, however, given her history of MI will order a Troponin and repeat in 2 hours to monitor. Plan is still to have cardiology perform a TEE and place a loop recorder on Monday. As she continues to improve with her speech CIR might not be needed instead following up with outpatient therapy. Will continue to follow recommendations.  Stroke - Left MCA M2 branchstrokesecondary to large vessel diseasevs. cardioembolic source  CT head Code Stroke -Small amount of white matter hypodensity within the posterior left frontal lobe. In the setting of an acute distal left M2 MCA branch occlusion, this may reflect a small acute white matter infarct or chronic small vessel ischemic disease.  CT head-No evidence of acute intracranial hemorrhage status post tPA. No demarcated cortical infarct. As before, there is subtle hypodensity within the posterior left frontal lobe white matter. In the setting of an acute distal left M2 MCA branch occlusion, this may reflect a small acute white matter infarct or chronic small vessel ischemic disease. Stable background mild cerebral white matter chronic small vessel ischemic disease. Right sphenoid sinusitis.  CTA head & neck-Occluded distal M2 left MCA branch vessel. Mild non-stenotic calcified plaque within the intracranial ICAs bilaterally.  CT perfusion-The perfusion software identifies a 16 mL region of hypoperfusion within the posterior left MCA territory utilizing the Tmax>6 seconds threshold. The perfusion  software identifies no core infarct. Mismatch volume: 16 mL.  MRA - Ordered  2D Echo- EF:45-50%. No wall motion abnormalities. Mid to apical anteroseptal akinesis.  DVT US - No evidence of DVT bilaterally  TEE and loop recorder pending Monday  LDL51  HgbA1c5.4  VTE prophylaxis -SCD's        Diet     Diet regular Room service appropriate? Yes with Assist; Fluid consistency: Thin         aspirin 81 mg dailyprior to admission, now on aspirin 81 mg daily and clopidogrel 75 mg dailyfor 3 weeks then continue with Plavix alone.  Therapy recommendations:outpt PT  Disposition:Pending  Hypertension  Home meds:Ramipril, Metoprolol  Currently on Cleviprex  Stable  Long-term BP goal normotensive  Hyperlipidemia  Home meds:Crestor 40 mg,resumed in hospital  LDL 51, goal < 70  She is currently at goal will not change at this time.  Continue statin at discharge  Other Stroke Risk Factors  FormerCigarette smokerstopped after MI  Obesity,There is no height or weight on file to calculate BMI., BMI >/= 30 associated with increased stroke risk, recommend weight loss, diet and exercise as appropriate   Family hx stroke (Maternal Grandmother- Stroke)  Coronary artery diseaseMI in 2010  Other  Active Problems  Hypothyroidism - Levothyroxine 75 mcg   Dr. Merlene Laughter -- The patient was seen and examined by me; notes, chart and tests reviewed and discussed with the practice provider, other providers, patient, and family.  Brief episodes of chest pain thought to be due to anxiety. Troponin I levels normal.  Stable depressive aphasia although improved with some naming difficulty and paraphasic errors.   Hospital day # 3  Fatima Sanger MD Resident   To contact Stroke Continuity provider, please refer to [email or web address] After hours, contact General Neurology

## 2020-04-22 LAB — CBC
HCT: 35.8 % — ABNORMAL LOW (ref 36.0–46.0)
Hemoglobin: 11.7 g/dL — ABNORMAL LOW (ref 12.0–15.0)
MCH: 28.6 pg (ref 26.0–34.0)
MCHC: 32.7 g/dL (ref 30.0–36.0)
MCV: 87.5 fL (ref 80.0–100.0)
Platelets: 197 10*3/uL (ref 150–400)
RBC: 4.09 MIL/uL (ref 3.87–5.11)
RDW: 15.2 % (ref 11.5–15.5)
WBC: 5.9 10*3/uL (ref 4.0–10.5)
nRBC: 0 % (ref 0.0–0.2)

## 2020-04-22 LAB — BASIC METABOLIC PANEL
Anion gap: 9 (ref 5–15)
BUN: 18 mg/dL (ref 8–23)
CO2: 23 mmol/L (ref 22–32)
Calcium: 8.5 mg/dL — ABNORMAL LOW (ref 8.9–10.3)
Chloride: 106 mmol/L (ref 98–111)
Creatinine, Ser: 0.84 mg/dL (ref 0.44–1.00)
GFR, Estimated: >60 mL/min (ref >60)
Glucose, Bld: 101 mg/dL — ABNORMAL HIGH (ref 70–99)
Potassium: 4.3 mmol/L (ref 3.5–5.1)
Sodium: 138 mmol/L (ref 135–145)

## 2020-04-22 NOTE — Progress Notes (Signed)
Physical Therapy Treatment Patient Details Name: Tracey [name] MRN: [id number] DOB: [birth year] Today's Date: 04/22/2020    History of Present Illness The pt is a 65 yo female presenting 3/23 with slurred speech and expressive aphasia. Imaging revealed L distal MCA occlusion, and pt is now s/p partial revascularization on 3/23. PMH includes: HLD, PAD, GERD, MI (s/p stent placement and cardiac rehab, followed by [facility]), CAD.    PT Comments    Pt demonstrates independence with bed mobility and transfers. Supervision provided for ambulation 500' without AD. Mild drift R/L but no LOB noted. Addressed balance challenges during gait (head turns, start/stop, obstacles) without difficulty. Max HR 130. Pt reports feeling deconditioned, stating "I can tell I've been in the bed for 3 days." Advised gradual return to baseline activity level due to previous cardiac history. Pt sitting in recliner at end of session. Family present.   Follow Up Recommendations  Outpatient PT     Equipment Recommendations  None recommended by PT    Recommendations for Other Services       Precautions / Restrictions Precautions Precautions: Fall    Mobility  Bed Mobility Overal bed mobility: Independent                  Transfers Overall transfer level: Modified independent                  Ambulation/Gait Ambulation/Gait assistance: Supervision Gait Distance (Feet): 500 Feet Assistive device: None Gait Pattern/deviations: WFL(Within Functional Limits);Drifts right/left Gait velocity: WFL Gait velocity interpretation: >2.62 ft/sec, indicative of community ambulatory General Gait Details: mild drift L/R but no LOB. Max HR 130 during amb.   Stairs             Wheelchair Mobility    Modified Rankin (Stroke Patients Only) Modified Rankin (Stroke Patients Only) Pre-Morbid Rankin Score: No symptoms Modified Rankin: Moderate disability     Balance Overall balance assessment:  Needs assistance Sitting-balance support: No upper extremity supported;Feet supported Sitting balance-Leahy Scale: Normal     Standing balance support: No upper extremity supported;During functional activity Standing balance-Leahy Scale: Good                 High Level Balance Comments: Addressed head turns, starting/stopping, maneuvering around obstacles, and 180 degree turns during gait.            Cognition Arousal/Alertness: Awake/alert Behavior During Therapy: WFL for tasks assessed/performed Overall Cognitive Status: Difficult to assess Area of Impairment: Following commands;Awareness;Problem solving;Attention                   Current Attention Level: Selective   Following Commands: Follows one step commands consistently;Follows multi-step commands with increased time   Awareness: Emergent Problem Solving: Difficulty sequencing;Requires verbal cues        Exercises      General Comments General comments (skin integrity, edema, etc.): Family present and supportive.      Pertinent Vitals/Pain Pain Assessment: No/denies pain    Home Living                      Prior Function            PT Goals (current goals can now be found in the care plan section) Acute Rehab PT Goals Patient Stated Goal: return home Progress towards PT goals: Progressing toward goals    Frequency    Min 4X/week      PT Plan Current plan remains appropriate  Co-evaluation              AM-PAC PT "6 Clicks" Mobility   Outcome Measure  Help needed turning from your back to your side while in a flat bed without using bedrails?: None Help needed moving from lying on your back to sitting on the side of a flat bed without using bedrails?: None Help needed moving to and from a bed to a chair (including a wheelchair)?: None Help needed standing up from a chair using your arms (e.g., wheelchair or bedside chair)?: None Help needed to walk in hospital  room?: A Little Help needed climbing 3-5 steps with a railing? : A Little 6 Click Score: 22    End of Session   Activity Tolerance: Patient tolerated treatment well Patient left: in chair;with call bell/phone within reach;with family/visitor present Nurse Communication: Mobility status PT Visit Diagnosis: Unsteadiness on feet (R26.81);Other abnormalities of gait and mobility (R26.89);Hemiplegia and hemiparesis Hemiplegia - Right/Left: Right Hemiplegia - dominant/non-dominant: Dominant Hemiplegia - caused by: Cerebral infarction     Time: 8887-5797 PT Time Calculation (min) (ACUTE ONLY): 24 min  Charges:  $Gait Training: 23-37 mins                     Lorrin Goodell, PT  Office # [phone] Pager [phone]    Lorriane Shire 04/22/2020, 10:55 AM

## 2020-04-22 NOTE — Progress Notes (Signed)
STROKE TEAM PROGRESS NOTE   INTERVAL HISTORY Her husband and daughter are at the bedside.  No anxiety, and agitation or chest pain reported.  Speech is overall about the same.    Cardiology is following for TEE and loop recorder on Monday. Vitals:   04/21/20 2300 04/22/20 0424 04/22/20 0726 04/22/20 1144  BP: 136/63 (!) 115/58 132/69 131/65  Pulse: 93 87 80 79  Resp: 18 16 20 18   Temp: 98.4 F (36.9 C) 98.8 F (37.1 C) 97.8 F (36.6 C) 98.6 F (37 C)  TempSrc: Oral  Oral Oral  SpO2: 98% 98% 100% 100%  Weight:       CBC:  Recent Labs  Lab 04/18/20 1010 04/19/20 0500 04/20/20 0052 04/21/20 0056 04/22/20 0540  WBC 4.2 9.3   < > 7.6 5.9  NEUTROABS 2.4 8.3*  --   --   --   HGB 11.9* 13.0   < > 11.4* 11.7*  HCT 36.6 39.9   < > 35.2* 35.8*  MCV 86.5 86.4   < > 87.6 87.5  PLT 195 238   < > 205 197   < > = values in this interval not displayed.   Basic Metabolic Panel:  Recent Labs  Lab 04/21/20 0056 04/22/20 0241  NA 140 138  K 3.4* 4.3  CL 107 106  CO2 25 23  GLUCOSE 98 101*  BUN 18 18  CREATININE 0.81 0.84  CALCIUM 8.7* 8.5*   Lipid Panel:  Recent Labs  Lab 04/19/20 0500  CHOL 135  TRIG 55  HDL 73  CHOLHDL 1.8  VLDL 11  LDLCALC 51   HgbA1c:  Recent Labs  Lab 04/19/20 0500  HGBA1C 5.4   Urine Drug Screen:  Recent Labs  Lab 04/18/20 1010  LABOPIA NONE DETECTED  COCAINSCRNUR NONE DETECTED  LABBENZ NONE DETECTED  AMPHETMU NONE DETECTED  THCU NONE DETECTED  LABBARB NONE DETECTED    Alcohol Level  Recent Labs  Lab 04/18/20 1010  ETH <10    IMAGING past 24 hours No results found.  PHYSICAL EXAM Blood pressure 131/65, pulse 79, temperature 98.6 F (37 C), temperature source Oral, resp. rate 18, weight 97.8 kg, SpO2 100 %.  General: alert and awake, caucasian female, no apparent distress  Lungs: Symmetrical Chest rise, no labored breathing  Cardio: Regular Rate and Rhythm  Abdomen: Soft, non-tender  Neuro: Alert, oriented, thought  content appropriate.  Expressive Aphasia.  Still has word finding difficulties with a paraphasic errors but good comprehension. Cranial Nerves: II:  Visual fields grossly normal, pupils equal, round, reactive to light and accommodation III,IV, VI: ptosis not present, extra-ocular motions intact bilaterally V,VII: smile symmetric, facial light touch sensation normal bilaterally VIII: hearing normal bilaterally IX,X: uvula rises symmetrically XI: bilateral shoulder shrug XII: midline tongue extension without atrophy or fasciculations  Motor: Right : Upper extremity   5/5    Left:     Upper extremity   5/5  Lower extremity   5/5     Lower extremity   5/5 Tone and bulk:normal tone throughout; no atrophy noted Sensory: light touch intact throughout, bilaterally Cerebellar: normal finger-to-nose Gait: deferred    ASSESSMENT/PLAN [name] is a 65 y.o. female with history of CAD s/p MI and cardiac arrest with stentins 04/2008, restenting 11/2018, claudication, HTN, hypothryoidism, HLD, and anxietypresenting with aphasia.She was at work and seem to go into the bathroom at 9:15 AM. Later around 9:20 AM she was found on the floor aphasic and with  some right-sided weakness. She was already improving on EMS arrival and they noted her blood pressure to be in the 140s over 60s with a glucose of 100.Patient and husband deny any recent focal neurological symptoms or signs or symptoms of infection or signs and symptoms of bleeding. Husband does note she has generally been complaining of reduced hearing for some time but the patient reported acute onset left sided hearing loss today at the same time as her other symptoms began.She received tPA at [facility] and was transferred to [facility] and underwent revascularization by IR.   After her episode of chest pain her EKG was normal. Given her history of Panic attacks this is most likely there cause, however, given her history of MI will order a  Troponin and repeat in 2 hours to monitor. Plan is still to have cardiology perform a TEE and place a loop recorder on Monday. As she continues to improve with her speech CIR might not be needed instead following up with outpatient therapy. Will continue to follow recommendations.  Stroke - Left MCA M2 branchstrokesecondary to large vessel diseasevs. cardioembolic source  CT head Code Stroke -Small amount of white matter hypodensity within the posterior left frontal lobe. In the setting of an acute distal left M2 MCA branch occlusion, this may reflect a small acute white matter infarct or chronic small vessel ischemic disease.  CT head-No evidence of acute intracranial hemorrhage status post tPA. No demarcated cortical infarct. As before, there is subtle hypodensity within the posterior left frontal lobe white matter. In the setting of an acute distal left M2 MCA branch occlusion, this may reflect a small acute white matter infarct or chronic small vessel ischemic disease. Stable background mild cerebral white matter chronic small vessel ischemic disease. Right sphenoid sinusitis.  CTA head & neck-Occluded distal M2 left MCA branch vessel. Mild non-stenotic calcified plaque within the intracranial ICAs bilaterally.  CT perfusion-The perfusion software identifies a 16 mL region of hypoperfusion within the posterior left MCA territory utilizing the Tmax>6 seconds threshold. The perfusion software identifies no core infarct. Mismatch volume: 16 mL.  MRA - Ordered  2D Echo- EF:45-50%. No wall motion abnormalities. Mid to apical anteroseptal akinesis.  DVT US - No evidence of DVT bilaterally  TEE and loop recorder pending Monday  LDL51  HgbA1c5.4  VTE prophylaxis -SCD's        Diet     Diet regular Room service appropriate? Yes with Assist; Fluid consistency: Thin         aspirin 81 mg dailyprior to admission, now on aspirin 81 mg daily and clopidogrel 75 mg  dailyfor 3 weeks then continue with Plavix alone.  Therapy recommendations:outpt PT  Disposition:Pending  Hypertension  Home meds:Ramipril, Metoprolol  Currently on Cleviprex  Stable  Long-term BP goal normotensive  Hyperlipidemia  Home meds:Crestor 40 mg,resumed in hospital  LDL 51, goal < 70  She is currently at goal will not change at this time.  Continue statin at discharge  Other Stroke Risk Factors  FormerCigarette smokerstopped after MI  Obesity,There is no height or weight on file to calculate BMI., BMI >/= 30 associated with increased stroke risk, recommend weight loss, diet and exercise as appropriate   Family hx stroke (Maternal Grandmother- Stroke)  Coronary artery diseaseMI in 2010  Other Active Problems  Hypothyroidism - Levothyroxine 75 mcg     Hospital day # 4  Fatima Sanger MD Resident   To contact Stroke Continuity provider, please refer to [email or web address] After hours, contact  General Neurology

## 2020-04-22 NOTE — Progress Notes (Signed)
Patient complains of headache 6/10. Patient given tylenol 2 times during shift. Encouraged patient to take tylenol with caffeine if needing tylenol again. Patient able to follow commands. Pupils equal and reactive. Speech continues to improve. Will continue to monitor at this time.

## 2020-04-22 NOTE — Progress Notes (Signed)
Called to room by family. Pt c/o dizziness and chest pain 5/10. EKG done, MD notified. Family at bedside anxious. MD arrived assessed patient, evaluated EKG, spoke with family and patient at bedside, deems pt having anxiety attack which is not a new occurrence for patient. Orders for lab draw.

## 2020-04-23 ENCOUNTER — Inpatient Hospital Stay (HOSPITAL_COMMUNITY): Payer: 59 | Admitting: Anesthesiology

## 2020-04-23 ENCOUNTER — Encounter (HOSPITAL_COMMUNITY)
Admission: EM | Disposition: A | Discharge status: Home / Self Care | Payer: Self-pay | Source: Other Acute Inpatient Hospital | Attending: Neurology

## 2020-04-23 ENCOUNTER — Encounter (HOSPITAL_COMMUNITY): Payer: Self-pay | Admitting: Neurology

## 2020-04-23 ENCOUNTER — Inpatient Hospital Stay (HOSPITAL_COMMUNITY): Payer: 59

## 2020-04-23 DIAGNOSIS — I639 Cerebral infarction, unspecified: Secondary | ICD-10-CM

## 2020-04-23 DIAGNOSIS — I6389 Other cerebral infarction: Secondary | ICD-10-CM

## 2020-04-23 DIAGNOSIS — Q211 Atrial septal defect: Secondary | ICD-10-CM

## 2020-04-23 HISTORY — PX: TEE WITHOUT CARDIOVERSION: SHX5443

## 2020-04-23 HISTORY — PX: LOOP RECORDER INSERTION: EP1214

## 2020-04-23 HISTORY — PX: BUBBLE STUDY: SHX6837

## 2020-04-23 LAB — BASIC METABOLIC PANEL
Anion gap: 6 (ref 5–15)
BUN: 19 mg/dL (ref 8–23)
CO2: 25 mmol/L (ref 22–32)
Calcium: 8.9 mg/dL (ref 8.9–10.3)
Chloride: 109 mmol/L (ref 98–111)
Creatinine, Ser: 0.85 mg/dL (ref 0.44–1.00)
GFR, Estimated: >60 mL/min (ref >60)
Glucose, Bld: 110 mg/dL — ABNORMAL HIGH (ref 70–99)
Potassium: 3.5 mmol/L (ref 3.5–5.1)
Sodium: 140 mmol/L (ref 135–145)

## 2020-04-23 LAB — CBC
HCT: 34.3 % — ABNORMAL LOW (ref 36.0–46.0)
Hemoglobin: 11.3 g/dL — ABNORMAL LOW (ref 12.0–15.0)
MCH: 28.8 pg (ref 26.0–34.0)
MCHC: 32.9 g/dL (ref 30.0–36.0)
MCV: 87.3 fL (ref 80.0–100.0)
Platelets: 219 10*3/uL (ref 150–400)
RBC: 3.93 MIL/uL (ref 3.87–5.11)
RDW: 15.1 % (ref 11.5–15.5)
WBC: 6.5 10*3/uL (ref 4.0–10.5)
nRBC: 0 % (ref 0.0–0.2)

## 2020-04-23 SURGERY — ECHOCARDIOGRAM, TRANSESOPHAGEAL
Anesthesia: Monitor Anesthesia Care

## 2020-04-23 SURGERY — LOOP RECORDER INSERTION

## 2020-04-23 MED ORDER — SODIUM CHLORIDE 0.9 % IV SOLN
INTRAVENOUS | Status: DC
Start: 2020-04-23 — End: 2020-04-23

## 2020-04-23 MED ORDER — BUTAMBEN-TETRACAINE-BENZOCAINE 2-2-14 % EX AERO: INHALATION_SPRAY | CUTANEOUS | Status: DC | PRN

## 2020-04-23 MED ORDER — CLOPIDOGREL BISULFATE 75 MG PO TABS: 75.0000 mg | ORAL_TABLET | Freq: Every day | ORAL | 2 refills | Status: AC

## 2020-04-23 MED ORDER — ASPIRIN 81 MG PO TBEC: 81.0000 mg | DELAYED_RELEASE_TABLET | Freq: Every day | ORAL | 0 refills | Status: AC

## 2020-04-23 MED ORDER — PROPOFOL 10 MG/ML IV BOLUS
INTRAVENOUS | Status: DC | PRN
  Administered 2020-04-23: 20 mg via INTRAVENOUS
  Administered 2020-04-23: 30 mg via INTRAVENOUS
  Administered 2020-04-23: 20 mg via INTRAVENOUS

## 2020-04-23 MED ORDER — DEXMEDETOMIDINE (PRECEDEX) IN NS 20 MCG/5ML (4 MCG/ML) IV SYRINGE
PREFILLED_SYRINGE | INTRAVENOUS | Status: DC | PRN
  Administered 2020-04-23: 12 ug via INTRAVENOUS
  Administered 2020-04-23: 8 ug via INTRAVENOUS

## 2020-04-23 MED ORDER — GLYCOPYRROLATE 0.2 MG/ML IJ SOLN
INTRAMUSCULAR | Status: DC | PRN
  Administered 2020-04-23 (×2): .1 mg via INTRAVENOUS

## 2020-04-23 MED ORDER — LIDOCAINE HCL (CARDIAC) PF 100 MG/5ML IV SOSY
PREFILLED_SYRINGE | INTRAVENOUS | Status: DC | PRN
  Administered 2020-04-23: 60 mg via INTRATRACHEAL

## 2020-04-23 MED ORDER — PROPOFOL 500 MG/50ML IV EMUL
INTRAVENOUS | Status: DC | PRN
  Administered 2020-04-23: 125 ug/kg/min via INTRAVENOUS

## 2020-04-23 MED ORDER — SODIUM CHLORIDE 0.9 % IV SOLN: INTRAVENOUS | Status: DC | PRN

## 2020-04-23 SURGICAL SUPPLY — 2 items
MONITOR REVEAL LINQ II (Prosthesis & Implant Heart) ×2 IMPLANT
PACK LOOP INSERTION (CUSTOM PROCEDURE TRAY) ×2 IMPLANT

## 2020-04-23 NOTE — Anesthesia Postprocedure Evaluation (Signed)
Anesthesia Post Note  Patient: Tracey [name]  Procedure(s) Performed: TRANSESOPHAGEAL ECHOCARDIOGRAM (TEE) (N/A ) BUBBLE STUDY     Patient location during evaluation: Endoscopy Anesthesia Type: MAC Level of consciousness: awake and alert Pain management: pain level controlled Vital Signs Assessment: post-procedure vital signs reviewed and stable Respiratory status: spontaneous breathing, nonlabored ventilation, respiratory function stable and patient connected to nasal cannula oxygen Cardiovascular status: stable and blood pressure returned to baseline Postop Assessment: no apparent nausea or vomiting Anesthetic complications: no   No complications documented.  Last Vitals:  Vitals:   04/23/20 1500 04/23/20 1601  BP: 129/73 122/68  Pulse: 84 90  Resp: (!) 22 20  Temp:  36.6 C  SpO2: 100% 96%    Last Pain:  Vitals:   04/23/20 1601  TempSrc: Oral  PainSc:                  March Rummage Naveen Clardy

## 2020-04-23 NOTE — Anesthesia Preprocedure Evaluation (Addendum)
Anesthesia Evaluation  Patient identified by MRN, date of birth, ID band Patient awake    Reviewed: Allergy & Precautions, H&P , NPO status , Patient's Chart, lab work & pertinent test results, reviewed documented beta blocker date and time   Airway Mallampati: II  TM Distance: >3 FB Neck ROM: Full    Dental no notable dental hx. (+) Upper Dentures, Partial Lower, Dental Advisory Given   Pulmonary neg pulmonary ROS, former smoker,    Pulmonary exam normal breath sounds clear to auscultation       Cardiovascular + CAD, + Cardiac Stents and + Peripheral Vascular Disease  negative cardio ROS   Rhythm:Regular Rate:Normal     Neuro/Psych Anxiety CVA, Residual Symptoms negative neurological ROS  negative psych ROS   GI/Hepatic Neg liver ROS, GERD  Medicated,  Endo/Other  negative endocrine ROSHypothyroidism   Renal/GU negative Renal ROS  negative genitourinary   Musculoskeletal   Abdominal   Peds  Hematology negative hematology ROS (+)   Anesthesia Other Findings   Reproductive/Obstetrics negative OB ROS                           Anesthesia Physical Anesthesia Plan  ASA: III  Anesthesia Plan: MAC   Post-op Pain Management:    Induction: Intravenous  PONV Risk Score and Plan: 2 and Propofol infusion and Treatment may vary due to age or medical condition  Airway Management Planned: Nasal Cannula  Additional Equipment:   Intra-op Plan:   Post-operative Plan:   Informed Consent: I have reviewed the patients History and Physical, chart, labs and discussed the procedure including the risks, benefits and alternatives for the proposed anesthesia with the patient or authorized representative who has indicated his/her understanding and acceptance.     Dental advisory given  Plan Discussed with: CRNA  Anesthesia Plan Comments:         Anesthesia Quick Evaluation

## 2020-04-23 NOTE — Transfer of Care (Signed)
Immediate Anesthesia Transfer of Care Note  Patient: Tracey [name]  Procedure(s) Performed: TRANSESOPHAGEAL ECHOCARDIOGRAM (TEE) (N/A ) BUBBLE STUDY  Patient Location: Endoscopy Unit  Anesthesia Type:MAC  Level of Consciousness: drowsy and patient cooperative  Airway & Oxygen Therapy: Patient Spontanous Breathing  Post-op Assessment: Report given to RN and Post -op Vital signs reviewed and stable  Post vital signs: Reviewed and stable  Last Vitals:  Vitals Value Taken Time  BP 110/58 04/23/20 1441  Temp    Pulse 97 04/23/20 1441  Resp 27 04/23/20 1441  SpO2 100 % 04/23/20 1441  Vitals shown include unvalidated device data.  Last Pain:  Vitals:   04/23/20 1314  TempSrc: Oral  PainSc: 0-No pain      Patients Stated Pain Goal: 2 (15/83/09 4076)  Complications: No complications documented.

## 2020-04-23 NOTE — Progress Notes (Signed)
Kept on NPO post midnight, given sip of water for due oral med

## 2020-04-23 NOTE — Discharge Summary (Addendum)
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 Swallow whole. Stop taking after 05/10/2020 What changed: additional instructions   Cholecalciferol 50 MCG (2000 UT) Tabs Take 2,000 Units by mouth daily.   citalopram 20 MG tablet Commonly known as: CELEXA Take 20 mg by mouth daily.   clopidogrel 75 MG tablet Commonly known as: PLAVIX Take 1 tablet (75 mg total) by mouth daily.   cyanocobalamin 1000 MCG/ML injection Commonly known as: (VITAMIN B-12) Inject 1,000 mcg into the muscle every 30 (thirty) days.   levothyroxine 75 MCG tablet Commonly known as: SYNTHROID Take 75 mcg by mouth daily.   metoprolol succinate 25 MG 24 hr tablet Commonly known as: TOPROL-XL Take 25 mg by mouth daily.   nitroGLYCERIN 0.4 MG SL tablet Commonly known as: NITROSTAT Place 0.4 mg under the tongue every 5 (five) minutes as needed for chest pain.   pantoprazole 40 MG tablet Commonly known as: PROTONIX Take 40 mg by mouth daily.   ramipril 5 MG capsule Commonly known as: ALTACE Take 5 mg by mouth daily.   rosuvastatin 40 MG tablet Commonly known as: CRESTOR Take 40 mg by mouth daily.       LABORATORY STUDIES CBC    Component Value Date/Time   WBC 6.5 04/23/2020 0052   RBC 3.93 04/23/2020 0052   HGB 11.3 (L) 04/23/2020 0052   HCT 34.3 (L) 04/23/2020  0052   PLT 219 04/23/2020 0052   MCV 87.3 04/23/2020 0052   MCH 28.8 04/23/2020 0052   MCHC 32.9 04/23/2020 0052   RDW 15.1 04/23/2020 0052   LYMPHSABS 0.6 (L) 04/19/2020 0500   MONOABS 0.4 04/19/2020 0500   EOSABS 0.0 04/19/2020 0500   BASOSABS 0.0 04/19/2020 0500   CMP    Component Value Date/Time   NA 140 04/23/2020 0052   K 3.5 04/23/2020 0052   CL 109 04/23/2020 0052   CO2 25 04/23/2020 0052   GLUCOSE 110 (H) 04/23/2020 0052   BUN 19 04/23/2020 0052   CREATININE 0.85 04/23/2020 0052   CALCIUM 8.9 04/23/2020 0052   PROT 6.0 (L) 04/19/2020 0500   ALBUMIN 3.1 (L) 04/19/2020 0500   AST 20 04/19/2020 0500   ALT 16 04/19/2020 0500   ALKPHOS 46 04/19/2020 0500   BILITOT 0.7 04/19/2020 0500   GFRNONAA >60 04/23/2020 0052   GFRAA 78 (L) 06/13/2012 0525   COAGS Lab Results  Component Value Date   INR 0.9 04/18/2020   Lipid Panel    Component Value Date/Time   CHOL 135 04/19/2020 0500   TRIG 55 04/19/2020 0500   HDL 73 04/19/2020 0500   CHOLHDL 1.8 04/19/2020 0500   VLDL 11 04/19/2020 0500   LDLCALC 51 04/19/2020 0500   HgbA1C  Lab Results  Component  Value Date   HGBA1C 5.4 04/19/2020   Urinalysis    Component Value Date/Time   COLORURINE STRAW (A) 04/18/2020 1010   APPEARANCEUR CLEAR (A) 04/18/2020 1010   LABSPEC 1.017 04/18/2020 1010   PHURINE 7.0 04/18/2020 1010   GLUCOSEU NEGATIVE 04/18/2020 1010   HGBUR NEGATIVE 04/18/2020 1010   BILIRUBINUR NEGATIVE 04/18/2020 1010   KETONESUR NEGATIVE 04/18/2020 1010   PROTEINUR NEGATIVE 04/18/2020 1010   NITRITE NEGATIVE 04/18/2020 1010   LEUKOCYTESUR NEGATIVE 04/18/2020 1010   Urine Drug Screen     Component Value Date/Time   LABOPIA NONE DETECTED 04/18/2020 1010   COCAINSCRNUR NONE DETECTED 04/18/2020 1010   LABBENZ NONE DETECTED 04/18/2020 1010   AMPHETMU NONE DETECTED 04/18/2020 1010   THCU NONE DETECTED 04/18/2020 1010   LABBARB NONE DETECTED 04/18/2020 1010    Alcohol Level    Component Value  Date/Time   ETH <10 04/18/2020 1010     SIGNIFICANT DIAGNOSTIC STUDIES CT ANGIO HEAD W OR WO CONTRAST  Result Date: 04/18/2020 CLINICAL DATA:  Neuro deficit, acute, stroke suspected. Additional history provided: Speech difficulty, right facial numbness. EXAM: CT ANGIOGRAPHY HEAD AND NECK CT PERFUSION BRAIN TECHNIQUE: Multidetector CT imaging of the head and neck was performed using the standard protocol during bolus administration of intravenous contrast. Multiplanar CT image reconstructions and MIPs were obtained to evaluate the vascular anatomy. Carotid stenosis measurements (when applicable) are obtained utilizing NASCET criteria, using the distal internal carotid diameter as the denominator. Multiphase CT imaging of the brain was performed following IV bolus contrast injection. Subsequent parametric perfusion maps were calculated using RAPID software. CONTRAST:  131mL OMNIPAQUE IOHEXOL 350 MG/ML SOLN COMPARISON:  Report from brain MRI 12/15/1998 (images unavailable). FINDINGS: CTA NECK FINDINGS Aortic arch: Standard aortic branching. The visualized aortic arch is normal in caliber. Atherosclerotic plaque within the visualized aortic arch and proximal major branch vessels of the neck. No hemodynamically significant innominate or proximal subclavian artery stenosis. Right carotid system: CCA and ICA patent within the neck without stenosis or significant atherosclerotic disease. Calcified plaque within the proximal ECA. Left carotid system: CCA and ICA patent within the neck without stenosis stenosis. Trace calcified plaque within the carotid bifurcation. Vertebral arteries: Codominant and patent within the neck without stenosis. Skeleton: Cervical spondylosis with multilevel disc space narrowing, disc bulges, uncovertebral hypertrophy. No acute bony abnormality or aggressive osseous lesion. Other neck: No neck mass or cervical lymphadenopathy. Upper chest: No consolidation within the imaged lung apices.  Review of the MIP images confirms the above findings CTA HEAD FINDINGS Anterior circulation: The intracranial internal carotid arteries are patent. Mild calcified plaque within both vessels without stenosis. The M1 middle cerebral arteries are patent. There is an occluded distal M2 left MCA branch vessel (series 10, image 52) (series 12, image 27). The anterior cerebral arteries are patent. No intracranial aneurysm is identified. Posterior circulation: The intracranial vertebral arteries are patent. The basilar artery is patent. The posterior cerebral arteries are patent. Posterior communicating arteries are hypoplastic or absent bilaterally. Venous sinuses: Within the limitations of contrast timing, no convincing thrombus. Anatomic variants: As described Review of the MIP images confirms the above findings CT Brain Perfusion Findings: CBF (<30%) Volume: 13mL Perfusion (Tmax>6.0s) volume: 54mL (posterior left MCA vascular territory) Mismatch Volume: 80mL Infarction Location:None identified These results were called by telephone at the time of interpretation on 04/18/2020 at 10:47 am to provider [facility] , who verbally acknowledged these results. IMPRESSION: CTA neck: The common carotid, internal carotid and vertebral arteries are patent  within the neck without hemodynamically significant stenosis. CTA head: 1. Occluded distal M2 left MCA branch vessel. 2. Mild non-stenotic calcified plaque within the intracranial ICAs bilaterally. CT perfusion head: The perfusion software identifies a 16 mL region of hypoperfusion within the posterior left MCA territory utilizing the Tmax>6 seconds threshold. The perfusion software identifies no core infarct. Mismatch volume: 16 mL. Electronically Signed   By: Kellie Simmering DO   On: 04/18/2020 10:59   CT HEAD WO CONTRAST  Result Date: 04/20/2020 INDICATION: New onset global aphasia, with worsening right-sided weakness. Occluded inferior division left middle cerebral artery  mid M2 segment on CT angiogram of the head and neck. EXAM: 1. EMERGENT LARGE VESSEL OCCLUSION THROMBOLYSIS (anterior CIRCULATION) COMPARISON:  CT angiogram of the head and neck of April 18, 2020. MEDICATIONS: Ancef 2 g IV antibiotic was administered within 1 hour of the procedure. ANESTHESIA/SEDATION: General anesthesia CONTRAST:  Isovue 300 approximately 125 mL FLUOROSCOPY TIME:  Fluoroscopy Time: 24 minutes 36 seconds (1652 mGy). COMPLICATIONS: None immediate. TECHNIQUE: Following a full explanation of the procedure along with the potential associated complications, an informed witnessed consent was obtained. The risks of intracranial hemorrhage of 10%, worsening neurological deficit, ventilator dependency, death and inability to revascularize were all reviewed in detail with the patient's spouse. The patient was then put under general anesthesia by the Department of Anesthesiology at [facility]. The right groin was prepped and draped in the usual sterile fashion. Thereafter using modified Seldinger technique, transfemoral access into the right common femoral artery was obtained without difficulty. Over a 0.035 inch guidewire an 8 French 25 cm Pinnacle sheath was inserted. Through this, and also over a 0.035 inch guidewire a 5 [place] JB 1 catheter was advanced to the aortic arch region and selectively positioned in the left common carotid artery. FINDINGS: Left common carotid arteriogram demonstrates the left external carotid artery and its major branches to be widely patent. The left internal carotid artery at the bulb to the cranial skull base is widely patent. The left middle cerebral artery opacifies into the capillary and venous phases. Occlusion of the left middle cerebral artery inferior division prominent parietal branch is seen in the mid M2 to distal M2 region. This is associated with a moderate sized wedge shaped hypo perfusion defect. Left anterior cerebral artery opacifies into the capillary  and venous phases. PROCEDURE: Diagnostic 5 [place] JB 1 catheter in the left common carotid artery was then exchanged for a 0.035 inch 300 cm Rosen exchange guidewire for an 087 balloon guide catheter which had been prepped with 50% contrast and 50% heparinized saline infusion and advanced to the mid cervical left ICA. The guidewire was removed. Good aspiration obtained from the hub of the balloon guide catheter. Through this, and over a 0.014 inch Synchro standard micro guidewire a combination of a 136 cm 071 Zoom aspiration catheter and an 021 162 cm Trevo microcatheter was advanced to the supraclinoid left ICA. Micro guidewire was then gently manipulated with a torque device and advanced without difficulty through the occluded mid to distal M2 segment with a wire and the microcatheter being advanced to the M3 M4 junction. The micro guidewire was removed. There was slow aspiration of blood. A gentle contrast injection through the microcatheter demonstrated slow antegrade flow distal to the microcatheter. The micro guidewire was reintroduced and advanced further distally with the microcatheter also advanced slightly distally. The guidewire was removed. Again there was moderately improved aspiration of blood at the hub of the microcatheter  which was then connected to continuous heparinized saline infusion. The 071 Zoom aspiration catheter was advanced just inside the prominent proximal inferior division. With proximal flow arrest slow aspiration was performed with a 20 mL syringe at hub of the microcatheter, and with Penumbra aspiration device at the hub of the Zoom aspiration catheter for approximately 1-1/2 minutes. Thereafter, the combination of the microcatheter, the Zoom aspiration device was then gently retrieved and removed. No aspiration was noted at the hub of the microcatheter. Flow arrest was then reversed. A gentle control arteriogram performed through the balloon guide catheter in the distal left  internal carotid artery demonstrated patent the left MCA distribution with moderately improved recanalization of the previously noted occluded inferior division branch with the clot now noted more distally in the M3 M4 junction. Also noted was a moderate vasospasm of the left middle cerebral artery M1 segment. This responded to 2.5 mg of verapamil IA, and also 2 aliquots of 25 mcg of nitroglycerin intra-arterially. Given the increased risk of hemorrhagic complication with further mechanical thrombectomy of the small caliber of the distal occluded branch in the M3 M4 region it was not undertaken. Additional super selective IA infusion of tPA it was not performed for the same reason. The balloon guide was then retrieved into the left common carotid artery. Arteriogram performed through this continued to demonstrate wide patency of the left internal carotid artery proximally and distally with significantly improved caliber of the left middle cerebral artery due to vasospasm. Further recanalization of the previously occluded branch was noted on this arteriogram. A TICI 2C revascularization was achieved. The balloon guide was removed. An 8 French Angio-Seal hemostasis device was applied at the right groin puncture site. Distal pulses remained Dopplerable in all 4 extremities. A CT of the brain demonstrated cortical enhancement of the left frontotemporal, and left parietal regions probably representing hyperemia. Also noted was tubular high focal density in the distal cortical vasculature probably representing a vacuum phenomenon. No mass-effect or midline shift was noted. Patient's general anesthesia was reversed and the patient was then extubated without event. The patient was able to move all 4 extremities with the right upper extremity weaker. Patient continued to be globally aphasic. She was then transferred to the neuro ICU for post revascularization care. IMPRESSION: Status post endovascular partial recanalization  of occluded left middle cerebral artery inferior division branch in the mid M2 to distal M2 region to the M3 M4 region with contact aspiration and proximal aspiration achieving a TICI 2C revascularization. PLAN: Follow-up in the clinic 2-4 weeks post discharge. Electronically Signed   By: Luanne Bras M.D.   On: 04/19/2020 11:48   CT HEAD WO CONTRAST  Result Date: 04/19/2020 CLINICAL DATA:  Stroke post tPA and thrombectomy EXAM: CT HEAD WITHOUT CONTRAST TECHNIQUE: Contiguous axial images were obtained from the base of the skull through the vertex without intravenous contrast. COMPARISON:  [place] FINDINGS: Brain: There is no acute intracranial hemorrhage. A more well-defined area of hypoattenuation is present in the left parietal lobe. No mass effect. Ventricles are stable in size. Vascular: No new findings. Skull: Calvarium is unremarkable. Sinuses/Orbits: No acute finding. Other: None. IMPRESSION: No acute hemorrhage. Small more well-defined area of evolving acute infarction is present in the left parietal lobe. Electronically Signed   By: Macy Mis M.D.   On: 04/19/2020 11:53   CT Head Wo Contrast  Result Date: 04/18/2020 CLINICAL DATA:  Provided history: Status post tPA, worsening symptoms. EXAM: CT HEAD WITHOUT CONTRAST TECHNIQUE: Contiguous  axial images were obtained from the base of the skull through the vertex without intravenous contrast. COMPARISON:  Noncontrast head CT, CT angiogram head/neck and CT perfusion FINDINGS: Brain: Cerebral volume is normal for age. Redemonstrated small amount of ill-defined hypoattenuation within the posterior left frontal lobe head in the setting of an acute distal left M2 MCA branch vessel occlusion, this may reflect a small acute white matter infarct or chronic small vessel ischemic disease. Stable background mild cerebral white matter chronic small vessel ischemic disease. There is no acute intracranial hemorrhage. No demarcated cortical infarct. No  extra-axial fluid collection. No evidence of intracranial mass. No midline shift. Vascular: Residual circulating intravascular contrast limits evaluation for hyperdense vessels. Skull: No calvarial fracture. Sinuses/Orbits: Visualized orbits show no acute finding. Tiny mucous retention cyst and small volume frothy secretions within the right sphenoid sinus. These results were called by telephone at the time of interpretation on 04/18/2020 at 12:14 pm to provider Dr. Curly [place], who verbally acknowledged these results. IMPRESSION: No evidence of acute intracranial hemorrhage status post tPA. No demarcated cortical infarct. As before, there is subtle hypodensity within the posterior left frontal lobe white matter. In the setting of an acute distal left M2 MCA branch occlusion, this may reflect a small acute white matter infarct or chronic small vessel ischemic disease. Stable background mild cerebral white matter chronic small vessel ischemic disease. Right sphenoid sinusitis. Electronically Signed   By: Kellie Simmering DO   On: 04/18/2020 12:26   CT ANGIO NECK W OR WO CONTRAST  Result Date: 04/18/2020 CLINICAL DATA:  Neuro deficit, acute, stroke suspected. Additional history provided: Speech difficulty, right facial numbness. EXAM: CT ANGIOGRAPHY HEAD AND NECK CT PERFUSION BRAIN TECHNIQUE: Multidetector CT imaging of the head and neck was performed using the standard protocol during bolus administration of intravenous contrast. Multiplanar CT image reconstructions and MIPs were obtained to evaluate the vascular anatomy. Carotid stenosis measurements (when applicable) are obtained utilizing NASCET criteria, using the distal internal carotid diameter as the denominator. Multiphase CT imaging of the brain was performed following IV bolus contrast injection. Subsequent parametric perfusion maps were calculated using RAPID software. CONTRAST:  163mL OMNIPAQUE IOHEXOL 350 MG/ML SOLN COMPARISON:  Report from brain MRI  12/15/1998 (images unavailable). FINDINGS: CTA NECK FINDINGS Aortic arch: Standard aortic branching. The visualized aortic arch is normal in caliber. Atherosclerotic plaque within the visualized aortic arch and proximal major branch vessels of the neck. No hemodynamically significant innominate or proximal subclavian artery stenosis. Right carotid system: CCA and ICA patent within the neck without stenosis or significant atherosclerotic disease. Calcified plaque within the proximal ECA. Left carotid system: CCA and ICA patent within the neck without stenosis stenosis. Trace calcified plaque within the carotid bifurcation. Vertebral arteries: Codominant and patent within the neck without stenosis. Skeleton: Cervical spondylosis with multilevel disc space narrowing, disc bulges, uncovertebral hypertrophy. No acute bony abnormality or aggressive osseous lesion. Other neck: No neck mass or cervical lymphadenopathy. Upper chest: No consolidation within the imaged lung apices. Review of the MIP images confirms the above findings CTA HEAD FINDINGS Anterior circulation: The intracranial internal carotid arteries are patent. Mild calcified plaque within both vessels without stenosis. The M1 middle cerebral arteries are patent. There is an occluded distal M2 left MCA branch vessel (series 10, image 52) (series 12, image 27). The anterior cerebral arteries are patent. No intracranial aneurysm is identified. Posterior circulation: The intracranial vertebral arteries are patent. The basilar artery is patent. The posterior cerebral arteries are patent. Posterior communicating  arteries are hypoplastic or absent bilaterally. Venous sinuses: Within the limitations of contrast timing, no convincing thrombus. Anatomic variants: As described Review of the MIP images confirms the above findings CT Brain Perfusion Findings: CBF (<30%) Volume: 35mL Perfusion (Tmax>6.0s) volume: 58mL (posterior left MCA vascular territory) Mismatch Volume:  52mL Infarction Location:None identified These results were called by telephone at the time of interpretation on 04/18/2020 at 10:47 am to provider [facility] , who verbally acknowledged these results. IMPRESSION: CTA neck: The common carotid, internal carotid and vertebral arteries are patent within the neck without hemodynamically significant stenosis. CTA head: 1. Occluded distal M2 left MCA branch vessel. 2. Mild non-stenotic calcified plaque within the intracranial ICAs bilaterally. CT perfusion head: The perfusion software identifies a 16 mL region of hypoperfusion within the posterior left MCA territory utilizing the Tmax>6 seconds threshold. The perfusion software identifies no core infarct. Mismatch volume: 16 mL. Electronically Signed   By: Kellie Simmering DO   On: 04/18/2020 10:59   MR ANGIO HEAD WO CONTRAST  Result Date: 04/20/2020 CLINICAL DATA:  Stroke post tPA and thrombectomy EXAM: MRI HEAD WITHOUT CONTRAST MRA HEAD WITHOUT CONTRAST TECHNIQUE: Multiplanar, multiecho pulse sequences of the brain and surrounding structures were obtained without intravenous contrast. Angiographic images of the head were obtained using MRA technique without contrast. COMPARISON:  None. FINDINGS: MRI HEAD Brain: Moderate size area of reduced diffusion is present in the left parietal lobe spanning cortex to posterior left lateral ventricle. Few additional areas of left parietal cortical infarction. No evidence of hemorrhagic transformation. No significant mass effect. Additional patchy foci of T2 hyperintensity in the supratentorial white matter are nonspecific but may reflect mild chronic microvascular ischemic changes. Ventricles and sulci are within normal limits in size and configuration. There is no intracranial mass. There is no hydrocephalus or extra-axial fluid collection. Vascular: Major vessel flow voids at the skull base are preserved. Skull and upper cervical spine: Normal marrow signal is preserved.  Sinuses/Orbits: Paranasal sinuses are aerated. Orbits are unremarkable. Other: Sella is unremarkable.  Mastoid air cells are clear. MRA HEAD Intracranial internal carotid arteries are patent. Prox middle and anterior cerebral arteries are patent. Decreased flow related enhancement within the distal left M2/M3 at the site of thrombus on prior CTA. Intracranial vertebral arteries, basilar artery, proximal posterior cerebral arteries are patent. 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 Minimal additional small acute left parietal cortical infarcts. No evidence of hemorrhagic transformation. Mild chronic microvascular ischemic changes. Preserved but decreased flow related enhancement at the site of distal left M2/M3 MCA thrombus on prior CTA. Electronically Signed   By: Macy Mis M.D.   On: 04/20/2020 15:00   MR BRAIN WO CONTRAST  Result Date: 04/20/2020 CLINICAL DATA:  Stroke post tPA and thrombectomy EXAM: MRI HEAD WITHOUT CONTRAST MRA HEAD WITHOUT CONTRAST TECHNIQUE: Multiplanar, multiecho pulse sequences of the brain and surrounding structures were obtained without intravenous contrast. Angiographic images of the head were obtained using MRA technique without contrast. COMPARISON:  None. FINDINGS: MRI HEAD Brain: Moderate size area of reduced diffusion is present in the left parietal lobe spanning cortex to posterior left lateral ventricle. Few additional areas of left parietal cortical infarction. No evidence of hemorrhagic transformation. No significant mass effect. Additional patchy foci of T2 hyperintensity in the supratentorial white matter are nonspecific but may reflect mild chronic microvascular ischemic changes. Ventricles and sulci are within normal limits in size and configuration. There is no intracranial mass. There is no hydrocephalus or extra-axial  fluid collection. Vascular: Major vessel flow voids at the skull base are preserved. Skull and upper cervical spine:  Normal marrow signal is preserved. Sinuses/Orbits: Paranasal sinuses are aerated. Orbits are unremarkable. Other: Sella is unremarkable.  Mastoid air cells are clear. MRA HEAD Intracranial internal carotid arteries are patent. Prox middle and anterior cerebral arteries are patent. Decreased flow related enhancement within the distal left M2/M3 at the site of thrombus on prior CTA. Intracranial vertebral arteries, basilar artery, proximal posterior cerebral arteries are patent. 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 Minimal additional small acute left parietal cortical infarcts. No evidence of hemorrhagic transformation. Mild chronic microvascular ischemic changes. Preserved but decreased flow related enhancement at the site of distal left M2/M3 MCA thrombus on prior CTA. Electronically Signed   By: Macy Mis M.D.   On: 04/20/2020 15:00   IR CT Head Ltd  Result Date: 04/20/2020 INDICATION: New onset global aphasia, with worsening right-sided weakness. Occluded inferior division left middle cerebral artery mid M2 segment on CT angiogram of the head and neck. EXAM: 1. EMERGENT LARGE VESSEL OCCLUSION THROMBOLYSIS (anterior CIRCULATION) COMPARISON:  CT angiogram of the head and neck of April 18, 2020. MEDICATIONS: Ancef 2 g IV antibiotic was administered within 1 hour of the procedure. ANESTHESIA/SEDATION: General anesthesia CONTRAST:  Isovue 300 approximately 125 mL FLUOROSCOPY TIME:  Fluoroscopy Time: 24 minutes 36 seconds (1652 mGy). COMPLICATIONS: None immediate. TECHNIQUE: Following a full explanation of the procedure along with the potential associated complications, an informed witnessed consent was obtained. The risks of intracranial hemorrhage of 10%, worsening neurological deficit, ventilator dependency, death and inability to revascularize were all reviewed in detail with the patient's spouse. The patient was then put under general anesthesia by the Department of  Anesthesiology at [facility]. The right groin was prepped and draped in the usual sterile fashion. Thereafter using modified Seldinger technique, transfemoral access into the right common femoral artery was obtained without difficulty. Over a 0.035 inch guidewire an 8 French 25 cm Pinnacle sheath was inserted. Through this, and also over a 0.035 inch guidewire a 5 [place] JB 1 catheter was advanced to the aortic arch region and selectively positioned in the left common carotid artery. FINDINGS: Left common carotid arteriogram demonstrates the left external carotid artery and its major branches to be widely patent. The left internal carotid artery at the bulb to the cranial skull base is widely patent. The left middle cerebral artery opacifies into the capillary and venous phases. Occlusion of the left middle cerebral artery inferior division prominent parietal branch is seen in the mid M2 to distal M2 region. This is associated with a moderate sized wedge shaped hypo perfusion defect. Left anterior cerebral artery opacifies into the capillary and venous phases. PROCEDURE: Diagnostic 5 [place] JB 1 catheter in the left common carotid artery was then exchanged for a 0.035 inch 300 cm Rosen exchange guidewire for an 087 balloon guide catheter which had been prepped with 50% contrast and 50% heparinized saline infusion and advanced to the mid cervical left ICA. The guidewire was removed. Good aspiration obtained from the hub of the balloon guide catheter. Through this, and over a 0.014 inch Synchro standard micro guidewire a combination of a 136 cm 071 Zoom aspiration catheter and an 021 162 cm Trevo microcatheter was advanced to the supraclinoid left ICA. Micro guidewire was then gently manipulated with a torque device and advanced without difficulty through the occluded mid to distal M2 segment with a wire and  the microcatheter being advanced to the M3 M4 junction. The micro guidewire was removed. There was  slow aspiration of blood. A gentle contrast injection through the microcatheter demonstrated slow antegrade flow distal to the microcatheter. The micro guidewire was reintroduced and advanced further distally with the microcatheter also advanced slightly distally. The guidewire was removed. Again there was moderately improved aspiration of blood at the hub of the microcatheter which was then connected to continuous heparinized saline infusion. The 071 Zoom aspiration catheter was advanced just inside the prominent proximal inferior division. With proximal flow arrest slow aspiration was performed with a 20 mL syringe at hub of the microcatheter, and with Penumbra aspiration device at the hub of the Zoom aspiration catheter for approximately 1-1/2 minutes. Thereafter, the combination of the microcatheter, the Zoom aspiration device was then gently retrieved and removed. No aspiration was noted at the hub of the microcatheter. Flow arrest was then reversed. A gentle control arteriogram performed through the balloon guide catheter in the distal left internal carotid artery demonstrated patent the left MCA distribution with moderately improved recanalization of the previously noted occluded inferior division branch with the clot now noted more distally in the M3 M4 junction. Also noted was a moderate vasospasm of the left middle cerebral artery M1 segment. This responded to 2.5 mg of verapamil IA, and also 2 aliquots of 25 mcg of nitroglycerin intra-arterially. Given the increased risk of hemorrhagic complication with further mechanical thrombectomy of the small caliber of the distal occluded branch in the M3 M4 region it was not undertaken. Additional super selective IA infusion of tPA it was not performed for the same reason. The balloon guide was then retrieved into the left common carotid artery. Arteriogram performed through this continued to demonstrate wide patency of the left internal carotid artery proximally  and distally with significantly improved caliber of the left middle cerebral artery due to vasospasm. Further recanalization of the previously occluded branch was noted on this arteriogram. A TICI 2C revascularization was achieved. The balloon guide was removed. An 8 French Angio-Seal hemostasis device was applied at the right groin puncture site. Distal pulses remained Dopplerable in all 4 extremities. A CT of the brain demonstrated cortical enhancement of the left frontotemporal, and left parietal regions probably representing hyperemia. Also noted was tubular high focal density in the distal cortical vasculature probably representing a vacuum phenomenon. No mass-effect or midline shift was noted. Patient's general anesthesia was reversed and the patient was then extubated without event. The patient was able to move all 4 extremities with the right upper extremity weaker. Patient continued to be globally aphasic. She was then transferred to the neuro ICU for post revascularization care. IMPRESSION: Status post endovascular partial recanalization of occluded left middle cerebral artery inferior division branch in the mid M2 to distal M2 region to the M3 M4 region with contact aspiration and proximal aspiration achieving a TICI 2C revascularization. PLAN: Follow-up in the clinic 2-4 weeks post discharge. Electronically Signed   By: Luanne Bras M.D.   On: 04/19/2020 11:48   CT CEREBRAL PERFUSION W CONTRAST  Result Date: 04/18/2020 CLINICAL DATA:  Neuro deficit, acute, stroke suspected. Additional history provided: Speech difficulty, right facial numbness. EXAM: CT ANGIOGRAPHY HEAD AND NECK CT PERFUSION BRAIN TECHNIQUE: Multidetector CT imaging of the head and neck was performed using the standard protocol during bolus administration of intravenous contrast. Multiplanar CT image reconstructions and MIPs were obtained to evaluate the vascular anatomy. Carotid stenosis measurements (when applicable) are  obtained  utilizing NASCET criteria, using the distal internal carotid diameter as the denominator. Multiphase CT imaging of the brain was performed following IV bolus contrast injection. Subsequent parametric perfusion maps were calculated using RAPID software. CONTRAST:  185mL OMNIPAQUE IOHEXOL 350 MG/ML SOLN COMPARISON:  Report from brain MRI 12/15/1998 (images unavailable). FINDINGS: CTA NECK FINDINGS Aortic arch: Standard aortic branching. The visualized aortic arch is normal in caliber. Atherosclerotic plaque within the visualized aortic arch and proximal major branch vessels of the neck. No hemodynamically significant innominate or proximal subclavian artery stenosis. Right carotid system: CCA and ICA patent within the neck without stenosis or significant atherosclerotic disease. Calcified plaque within the proximal ECA. Left carotid system: CCA and ICA patent within the neck without stenosis stenosis. Trace calcified plaque within the carotid bifurcation. Vertebral arteries: Codominant and patent within the neck without stenosis. Skeleton: Cervical spondylosis with multilevel disc space narrowing, disc bulges, uncovertebral hypertrophy. No acute bony abnormality or aggressive osseous lesion. Other neck: No neck mass or cervical lymphadenopathy. Upper chest: No consolidation within the imaged lung apices. Review of the MIP images confirms the above findings CTA HEAD FINDINGS Anterior circulation: The intracranial internal carotid arteries are patent. Mild calcified plaque within both vessels without stenosis. The M1 middle cerebral arteries are patent. There is an occluded distal M2 left MCA branch vessel (series 10, image 52) (series 12, image 27). The anterior cerebral arteries are patent. No intracranial aneurysm is identified. Posterior circulation: The intracranial vertebral arteries are patent. The basilar artery is patent. The posterior cerebral arteries are patent. Posterior communicating arteries are  hypoplastic or absent bilaterally. Venous sinuses: Within the limitations of contrast timing, no convincing thrombus. Anatomic variants: As described Review of the MIP images confirms the above findings CT Brain Perfusion Findings: CBF (<30%) Volume: 15mL Perfusion (Tmax>6.0s) volume: 45mL (posterior left MCA vascular territory) Mismatch Volume: 54mL Infarction Location:None identified These results were called by telephone at the time of interpretation on 04/18/2020 at 10:47 am to provider [facility] , who verbally acknowledged these results. IMPRESSION: CTA neck: The common carotid, internal carotid and vertebral arteries are patent within the neck without hemodynamically significant stenosis. CTA head: 1. Occluded distal M2 left MCA branch vessel. 2. Mild non-stenotic calcified plaque within the intracranial ICAs bilaterally. CT perfusion head: The perfusion software identifies a 16 mL region of hypoperfusion within the posterior left MCA territory utilizing the Tmax>6 seconds threshold. The perfusion software identifies no core infarct. Mismatch volume: 16 mL. Electronically Signed   By: Kellie Simmering DO   On: 04/18/2020 10:59   ECHOCARDIOGRAM COMPLETE  Result Date: 04/18/2020    ECHOCARDIOGRAM REPORT   Patient Name:   Tracey [name] Date of Exam: 04/18/2020 Medical Rec #:  [id number]         Height:       65.0 in Accession #:    [id number]        Weight:       217.1 lb Date of Birth:  Apr 22, 1955         BSA:          2.048 m Patient Age:    64 years          BP:           130/97 mmHg Patient Gender: F                 HR:           93 bpm. Exam Location:  Inpatient Procedure: 2D Echo, Cardiac  Doppler and Color Doppler Indications:    Stroke  History:        Patient has prior history of Echocardiogram examinations. CAD;                 Risk Factors:Dyslipidemia and Former Smoker. GERD.  Sonographer:    Clayton Lefort RDCS (AE) Referring Phys: [id number] Karsten Fells [facility]  Sonographer Comments: No subcostal window  and patient is morbidly obese. Image acquisition challenging due to patient body habitus. Unable to adjust patient position. IMPRESSIONS  1. Left ventricular ejection fraction, by estimation, is 45 to 50%. The left ventricle has mildly decreased function. The left ventricle demonstrates regional wall motion abnormalities (see scoring diagram/findings for description). Mid to apical anteroseptal akinesis. Left ventricular diastolic parameters are indeterminate.  2. Right ventricular systolic function is normal. The right ventricular size is normal. Tricuspid regurgitation signal is inadequate for assessing PA pressure.  3. The mitral valve is normal in structure. No evidence of mitral valve regurgitation.  4. The aortic valve was not well visualized. Aortic valve regurgitation is not visualized. No aortic stenosis is present. FINDINGS  Left Ventricle: Left ventricular ejection fraction, by estimation, is 45 to 50%. The left ventricle has mildly decreased function. The left ventricle demonstrates regional wall motion abnormalities. The left ventricular internal cavity size was normal in size. There is no left ventricular hypertrophy. Left ventricular diastolic parameters are indeterminate.  LV Wall Scoring: The mid and distal anterior septum is akinetic. The apex is hypokinetic. The entire anterior wall, entire lateral wall, entire inferior wall, basal anteroseptal segment, mid inferoseptal segment, and basal inferoseptal segment are normal. Right Ventricle: The right ventricular size is normal. No increase in right ventricular wall thickness. Right ventricular systolic function is normal. Tricuspid regurgitation signal is inadequate for assessing PA pressure. Left Atrium: Left atrial size was normal in size. Right Atrium: Right atrial size was normal in size. Pericardium: Trivial pericardial effusion is present. Mitral Valve: The mitral valve is normal in structure. No evidence of mitral valve regurgitation.  Tricuspid Valve: The tricuspid valve is normal in structure. Tricuspid valve regurgitation is not demonstrated. Aortic Valve: The aortic valve was not well visualized. Aortic valve regurgitation is not visualized. No aortic stenosis is present. Aortic valve mean gradient measures 5.0 mmHg. Aortic valve peak gradient measures 9.7 mmHg. Aortic valve area, by VTI measures 1.80 cm. Pulmonic Valve: The pulmonic valve was not well visualized. Pulmonic valve regurgitation is not visualized. Aorta: The aortic root and ascending aorta are structurally normal, with no evidence of dilitation. IAS/Shunts: The interatrial septum was not well visualized.  LEFT VENTRICLE PLAX 2D LVIDd:         4.50 cm  Diastology LVIDs:         3.10 cm  LV e' medial:  8.59 cm/s LV PW:         0.90 cm  LV e' lateral: 10.40 cm/s LV IVS:        1.00 cm LVOT diam:     2.00 cm LV SV:         62 LV SV Index:   30 LVOT Area:     3.14 cm  RIGHT VENTRICLE RV Basal diam:  3.00 cm RV S prime:     12.30 cm/s TAPSE (M-mode): 2.6 cm LEFT ATRIUM             Index       RIGHT ATRIUM           Index LA diam:  3.40 cm 1.66 cm/m  RA Area:     14.60 cm LA Vol (A2C):   39.0 ml 19.04 ml/m RA Volume:   35.00 ml  17.09 ml/m LA Vol (A4C):   29.3 ml 14.30 ml/m LA Biplane Vol: 36.4 ml 17.77 ml/m  AORTIC VALVE AV Area (Vmax):    1.91 cm AV Area (Vmean):   1.93 cm AV Area (VTI):     1.80 cm AV Vmax:           156.00 cm/s AV Vmean:          110.000 cm/s AV VTI:            0.343 m AV Peak Grad:      9.7 mmHg AV Mean Grad:      5.0 mmHg LVOT Vmax:         94.60 cm/s LVOT Vmean:        67.500 cm/s LVOT VTI:          0.197 m LVOT/AV VTI ratio: 0.57  AORTA Ao Asc diam: 3.00 cm  SHUNTS Systemic VTI:  0.20 m Systemic Diam: 2.00 cm Oswaldo Milian MD Electronically signed by Oswaldo Milian MD Signature Date/Time: 04/18/2020/8:29:19 PM    Final    IR PERCUTANEOUS ART THROMBECTOMY/INFUSION INTRACRANIAL INC DIAG ANGIO  Result Date: 04/20/2020 INDICATION:  New onset global aphasia, with worsening right-sided weakness. Occluded inferior division left middle cerebral artery mid M2 segment on CT angiogram of the head and neck. EXAM: 1. EMERGENT LARGE VESSEL OCCLUSION THROMBOLYSIS (anterior CIRCULATION) COMPARISON:  CT angiogram of the head and neck of April 18, 2020. MEDICATIONS: Ancef 2 g IV antibiotic was administered within 1 hour of the procedure. ANESTHESIA/SEDATION: General anesthesia CONTRAST:  Isovue 300 approximately 125 mL FLUOROSCOPY TIME:  Fluoroscopy Time: 24 minutes 36 seconds (1652 mGy). COMPLICATIONS: None immediate. TECHNIQUE: Following a full explanation of the procedure along with the potential associated complications, an informed witnessed consent was obtained. The risks of intracranial hemorrhage of 10%, worsening neurological deficit, ventilator dependency, death and inability to revascularize were all reviewed in detail with the patient's spouse. The patient was then put under general anesthesia by the Department of Anesthesiology at [facility]. The right groin was prepped and draped in the usual sterile fashion. Thereafter using modified Seldinger technique, transfemoral access into the right common femoral artery was obtained without difficulty. Over a 0.035 inch guidewire an 8 French 25 cm Pinnacle sheath was inserted. Through this, and also over a 0.035 inch guidewire a 5 [place] JB 1 catheter was advanced to the aortic arch region and selectively positioned in the left common carotid artery. FINDINGS: Left common carotid arteriogram demonstrates the left external carotid artery and its major branches to be widely patent. The left internal carotid artery at the bulb to the cranial skull base is widely patent. The left middle cerebral artery opacifies into the capillary and venous phases. Occlusion of the left middle cerebral artery inferior division prominent parietal branch is seen in the mid M2 to distal M2 region. This is associated  with a moderate sized wedge shaped hypo perfusion defect. Left anterior cerebral artery opacifies into the capillary and venous phases. PROCEDURE: Diagnostic 5 [place] JB 1 catheter in the left common carotid artery was then exchanged for a 0.035 inch 300 cm Rosen exchange guidewire for an 087 balloon guide catheter which had been prepped with 50% contrast and 50% heparinized saline infusion and advanced to the mid cervical left ICA. The guidewire was removed. Good  aspiration obtained from the hub of the balloon guide catheter. Through this, and over a 0.014 inch Synchro standard micro guidewire a combination of a 136 cm 071 Zoom aspiration catheter and an 021 162 cm Trevo microcatheter was advanced to the supraclinoid left ICA. Micro guidewire was then gently manipulated with a torque device and advanced without difficulty through the occluded mid to distal M2 segment with a wire and the microcatheter being advanced to the M3 M4 junction. The micro guidewire was removed. There was slow aspiration of blood. A gentle contrast injection through the microcatheter demonstrated slow antegrade flow distal to the microcatheter. The micro guidewire was reintroduced and advanced further distally with the microcatheter also advanced slightly distally. The guidewire was removed. Again there was moderately improved aspiration of blood at the hub of the microcatheter which was then connected to continuous heparinized saline infusion. The 071 Zoom aspiration catheter was advanced just inside the prominent proximal inferior division. With proximal flow arrest slow aspiration was performed with a 20 mL syringe at hub of the microcatheter, and with Penumbra aspiration device at the hub of the Zoom aspiration catheter for approximately 1-1/2 minutes. Thereafter, the combination of the microcatheter, the Zoom aspiration device was then gently retrieved and removed. No aspiration was noted at the hub of the microcatheter. Flow arrest  was then reversed. A gentle control arteriogram performed through the balloon guide catheter in the distal left internal carotid artery demonstrated patent the left MCA distribution with moderately improved recanalization of the previously noted occluded inferior division branch with the clot now noted more distally in the M3 M4 junction. Also noted was a moderate vasospasm of the left middle cerebral artery M1 segment. This responded to 2.5 mg of verapamil IA, and also 2 aliquots of 25 mcg of nitroglycerin intra-arterially. Given the increased risk of hemorrhagic complication with further mechanical thrombectomy of the small caliber of the distal occluded branch in the M3 M4 region it was not undertaken. Additional super selective IA infusion of tPA it was not performed for the same reason. The balloon guide was then retrieved into the left common carotid artery. Arteriogram performed through this continued to demonstrate wide patency of the left internal carotid artery proximally and distally with significantly improved caliber of the left middle cerebral artery due to vasospasm. Further recanalization of the previously occluded branch was noted on this arteriogram. A TICI 2C revascularization was achieved. The balloon guide was removed. An 8 French Angio-Seal hemostasis device was applied at the right groin puncture site. Distal pulses remained Dopplerable in all 4 extremities. A CT of the brain demonstrated cortical enhancement of the left frontotemporal, and left parietal regions probably representing hyperemia. Also noted was tubular high focal density in the distal cortical vasculature probably representing a vacuum phenomenon. No mass-effect or midline shift was noted. Patient's general anesthesia was reversed and the patient was then extubated without event. The patient was able to move all 4 extremities with the right upper extremity weaker. Patient continued to be globally aphasic. She was then  transferred to the neuro ICU for post revascularization care. IMPRESSION: Status post endovascular partial recanalization of occluded left middle cerebral artery inferior division branch in the mid M2 to distal M2 region to the M3 M4 region with contact aspiration and proximal aspiration achieving a TICI 2C revascularization. PLAN: Follow-up in the clinic 2-4 weeks post discharge. Electronically Signed   By: Luanne Bras M.D.   On: 04/19/2020 11:48   CT HEAD CODE STROKE WO  CONTRAST  Addendum Date: 04/18/2020   ADDENDUM REPORT: 04/18/2020 11:12 ADDENDUM: Small amount of white matter hypodensity within the posterior left frontal lobe. In the setting of an acute distal left M2 MCA branch occlusion, this may reflect a small acute white matter infarct or chronic small vessel ischemic disease. These results were called by telephone at the time of interpretation on 04/18/2020 at 11:11 am to provider Dr. Curly Shores, who verbally acknowledged these results. Electronically Signed   By: Kellie Simmering DO   On: 04/18/2020 11:12   Addendum Date: 04/18/2020   ADDENDUM REPORT: 04/18/2020 10:30 ADDENDUM: These results were called by telephone at the time of interpretation on 04/18/2020 at 10:28 am to provider [facility] , who verbally acknowledged these results. Electronically Signed   By: Kellie Simmering DO   On: 04/18/2020 10:30   Result Date: 04/18/2020 CLINICAL DATA:  Code stroke.  Slurred speech. EXAM: CT HEAD WITHOUT CONTRAST TECHNIQUE: Contiguous axial images were obtained from the base of the skull through the vertex without intravenous contrast. COMPARISON:  Report from brain MRI 12/15/1998 (images unavailable). FINDINGS: Brain: Cerebral volume is normal for age. Mild patchy and ill-defined hypoattenuation within the cerebral white matter is nonspecific, but compatible chronic small vessel ischemic disease. There is no acute intracranial hemorrhage. No demarcated cortical infarct. No extra-axial fluid collection. No  evidence of intracranial mass. No midline shift. Partially empty sella turcica. Vascular: No hyperdense vessel.  Atherosclerotic calcifications Skull: Normal. Negative for fracture or focal lesion. Sinuses/Orbits: Visualized orbits show no acute finding. Small volume frothy secretions within the right sphenoid sinus. ASPECTS [facility] Stroke Program Early CT Score) - Ganglionic level infarction (caudate, lentiform nuclei, internal capsule, insula, M1-M3 cortex): 7 - Supraganglionic infarction (M4-M6 cortex): 3 Total score (0-10 with 10 being normal): 10 IMPRESSION: No evidence of acute intracranial abnormality.  ASPECTS is 10. Mild cerebral white matter chronic small vessel ischemic disease. Right sphenoid sinusitis. Electronically Signed: By: Kellie Simmering DO On: 04/18/2020 10:26   VAS [place] LOWER EXTREMITY VENOUS (DVT)  Result Date: 04/19/2020  Lower Venous DVT Study Indications: Stroke.  Comparison Study: No prior Performing Technologist: Oda Cogan RDMS, RVT  Examination Guidelines: A complete evaluation includes B-mode imaging, spectral Doppler, color Doppler, and power Doppler as needed of all accessible portions of each vessel. Bilateral testing is considered an integral part of a complete examination. Limited examinations for reoccurring indications may be performed as noted. The reflux portion of the exam is performed with the patient in reverse Trendelenburg.  +---------+---------------+---------+-----------+----------+--------------+ RIGHT    CompressibilityPhasicitySpontaneityPropertiesThrombus Aging +---------+---------------+---------+-----------+----------+--------------+ CFV      Full           Yes      Yes                                 +---------+---------------+---------+-----------+----------+--------------+ SFJ      Full                                                        +---------+---------------+---------+-----------+----------+--------------+ FV Prox  Full                                                         +---------+---------------+---------+-----------+----------+--------------+  FV Mid   Full                                                        +---------+---------------+---------+-----------+----------+--------------+ FV DistalFull                                                        +---------+---------------+---------+-----------+----------+--------------+ PFV      Full                                                        +---------+---------------+---------+-----------+----------+--------------+ POP      Full           Yes      Yes                                 +---------+---------------+---------+-----------+----------+--------------+ PTV      Full                                                        +---------+---------------+---------+-----------+----------+--------------+ PERO     Full                                                        +---------+---------------+---------+-----------+----------+--------------+   +---------+---------------+---------+-----------+----------+--------------+ LEFT     CompressibilityPhasicitySpontaneityPropertiesThrombus Aging +---------+---------------+---------+-----------+----------+--------------+ CFV      Full           Yes      Yes                                 +---------+---------------+---------+-----------+----------+--------------+ SFJ      Full                                                        +---------+---------------+---------+-----------+----------+--------------+ FV Prox  Full                                                        +---------+---------------+---------+-----------+----------+--------------+ FV Mid   Full                                                        +---------+---------------+---------+-----------+----------+--------------+  FV DistalFull                                                         +---------+---------------+---------+-----------+----------+--------------+ PFV      Full                                                        +---------+---------------+---------+-----------+----------+--------------+ POP      Full           Yes      Yes                                 +---------+---------------+---------+-----------+----------+--------------+ PTV      Full                                                        +---------+---------------+---------+-----------+----------+--------------+ PERO     Full                                                        +---------+---------------+---------+-----------+----------+--------------+     Summary: BILATERAL: - No evidence of deep vein thrombosis seen in the lower extremities, bilaterally. -No evidence of popliteal cyst, bilaterally.   *See table(s) above for measurements and observations. Electronically signed by Monica Martinez MD on 04/19/2020 at 6:23:13 PM.    Final       HISTORY OF PRESENT ILLNESS Tracey [name] is a 65 y.o. female with history of CAD s/p MI and cardiac arrest with stents in 04/2008, restenting 11/2018, claudication, HTN, hypothryoidism, HLD, and anxietypresenting with aphasia.She was at work and seem to go into the bathroom at 9:15 AM. Later around 9:20 AM she was found on the floor aphasic and with some right-sided weakness. She was already improving on EMS arrival and they noted her blood pressure to be in the 140s over 60s with a glucose of 100.Patient and husband deny any recent focal neurological symptoms or signs or symptoms of infection or signs and symptoms of bleeding. Husband does note she has generally been complaining of reduced hearing for some time but the patient reported acute onset left sided hearing loss today at the same time as her other symptoms began.She received tPA at [facility] and was transferred to [facility] and underwent revascularization by IR.  HOSPITAL  COURSE Patient presented to [facility] ED on 3/23 with confusion and slurred speech. 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 She was then given tPA, near the end of the infusion staff thought her speech worsened so tPA was stopped. Emergent CTH was done which revealed no intracranial hemorrhage. She was transferred to [facility] at this time. She underwent revascularization by IR which was successful. Her speech continued to improve as initially she was recommended for CIR and  improved to the point where outpatient speech therapy was recommended. On discharge she still has some expressive aphasia and does have Alexia present.  RN Pressure Injury Documentation:     DISCHARGE EXAM Blood pressure (!) 160/60, pulse 82, temperature 97.9 F (36.6 C), temperature source Oral, resp. rate 14, height 5\' 5"  (1.651 m), weight 95.3 kg, SpO2 100 %.  General: alert and awake, caucasian female, no apparent distress  Lungs: Symmetrical Chest rise, no labored breathing  Cardio: Regular Rate and Rhythm  Abdomen: Soft, non-tender  Neuro: Alert, oriented, thought content appropriate.  Expressive Aphasia still present but improved. Alexia present but improving.  Able to follow all commands without difficulty.  Cranial Nerves: II:  Visual fields grossly normal, pupils equal, round, reactive to light and accommodation III,IV, VI: ptosis not present, extra-ocular motions intact bilaterally V,VII: smile symmetric, facial light touch sensation normal bilaterally VIII: hearing normal bilaterally IX,X: uvula rises symmetrically XI: bilateral shoulder shrug XII: midline tongue extension without atrophy or fasciculations  Motor: Right :  Upper extremity   5/5                                      Left:     Upper extremity   5/5             Lower extremity   5/5                                                  Lower extremity   5/5 Tone and bulk:normal tone throughout; no atrophy noted Sensory: light touch  intact throughout, bilaterally Cerebellar: normal finger-to-nose Gait: deferred    Discharge Diet       Diet   Diet NPO time specified   liquids  DISCHARGE PLAN  Disposition:  Home with outpatient speech therapy  aspirin 81 mg daily and clopidogrel 75 mg daily for secondary stroke prevention for 3 weeks then Plavix alone.  Ongoing stroke risk factor control by Primary Care Physician at time of discharge  Follow-up PCP Rusty Aus, MD in 2 weeks.  Follow-up in [facility] in 6 weeks, office to schedule an appointment.   35 minutes were spent preparing discharge.  Fatima Sanger MD Resident I have personally obtained history,examined this patient, reviewed notes, independently viewed imaging studies, participated in medical decision making and plan of care.ROS completed by me personally and pertinent positives fully documented  I have made any additions or clarifications directly to the above note. Agree with note above.    Antony Contras, MD Medical Director [facility] Pager: [phone] 04/23/2020 3:48 PM

## 2020-04-23 NOTE — Anesthesia Procedure Notes (Signed)
Procedure Name: MAC Date/Time: 04/23/2020 2:16 PM Performed by: Kathryne Hitch, CRNA Pre-anesthesia Checklist: Patient identified, Emergency Drugs available, Suction available and Patient being monitored Patient Re-evaluated:Patient Re-evaluated prior to induction Oxygen Delivery Method: Nasal cannula Preoxygenation: Pre-oxygenation with 100% oxygen Induction Type: IV induction Placement Confirmation: positive ETCO2 Dental Injury: Teeth and Oropharynx as per pre-operative assessment

## 2020-04-23 NOTE — Progress Notes (Signed)
PT Cancellation Note  Patient Details Name: Tracey [name] MRN: [id number] DOB: Jul 08, 1955   Cancelled Treatment:    Reason Eval/Treat Not Completed: Patient at procedure or test/unavailable - pt at TEE, will check back as schedule allows.  Stacie Glaze, PT Acute Rehabilitation Services Pager [phone]  Office [phone]    Louis Matte 04/23/2020, 1:36 PM

## 2020-04-23 NOTE — Interval H&P Note (Signed)
History and Physical Interval Note:  04/23/2020 1:33 PM  Tracey [name]  has presented today for surgery, with the diagnosis of STORKE.  The various methods of treatment have been discussed with the patient and family. After consideration of risks, benefits and other options for treatment, the patient has consented to  Procedure(s): TRANSESOPHAGEAL ECHOCARDIOGRAM (TEE) (N/A) as a surgical intervention.  The patient's history has been reviewed, patient examined, no change in status, stable for surgery.  I have reviewed the patient's chart and labs.  Questions were answered to the patient's satisfaction.    TEE for stroke. Loop recorder to follow. NPO at midnight.   Lake Bells T. Audie Box, MD, [place]  [address], [place], [place] [address] [phone]  1:34 PM

## 2020-04-23 NOTE — H&P (View-Only) (Signed)
ELECTROPHYSIOLOGY CONSULT NOTE  Patient ID: Tracey [name] MRN: [id number], DOB/AGE: 10/08/1955   Admit date: 04/18/2020 Date of Consult: 04/23/2020  Primary Physician: Rusty Aus, MD Primary Cardiologist: Dr. Charlane Ferretti [facility]) Reason for Consultation: Cryptogenic stroke; recommendations regarding Implantable Loop Recorder, requested by Dr. Erlinda Hong  History of Present Illness Tracey [name] was admitted on 04/18/2020 with acute aphasia, R sided weakness and hearing loss, acute stroke >> tPA >> emergent revascularization.    PMHx includes: CAD (04/2008 STEMI, c/b cardiogenic shock & cardiac arrest, PCI occluded LAD w/BMS,normal heart cath 01/16/2015 with no significant coronary disease, PCWP 7, CI 3.2.), Ischemic Cardiomyopathy (EF 45% 12/2014 ), PVD w/claudication, GERD, HTN, HLD, hypothyroidism  Neurology notes: Left MCA M2 branchstrokesecondary tolarge vesseldiseasevs. cardioembolic source.  she has undergone workup for stroke including echocardiogram and carotid angio.  The patient has been monitored on telemetry which has demonstrated sinus rhythm with no arrhythmias.  Inpatient stroke work-up is to be completed with a TEE.   Echocardiogram this admission demonstrated  IMPRESSIONS  1. Left ventricular ejection fraction, by estimation, is 45 to 50%. The  left ventricle has mildly decreased function. The left ventricle  demonstrates regional wall motion abnormalities (see scoring  diagram/findings for description). Mid to apical  anteroseptal akinesis. Left ventricular diastolic parameters are  indeterminate.  2. Right ventricular systolic function is normal. The right ventricular  size is normal. Tricuspid regurgitation signal is inadequate for assessing  PA pressure.  3. The mitral valve is normal in structure. No evidence of mitral valve  regurgitation.  4. The aortic valve was not well visualized. Aortic valve regurgitation  is not visualized. No aortic stenosis is  present.   2016 TTE LVEF 45% Closest EF: 45%(Estimated)Apical: HYPOCONTRACTILE   LV masses: No Masses Inferior: HYPOCONTRACTILE   LVH: None Posterior: Normal   LV Note: DISTAL ANTEROSEPTUM AKINETIC    Lab work is reviewed. She apparently had an episode of CP vs panic attach, HS Trop 10 and 10  Prior to admission, the patient denies chest pain, shortness of breath, dizziness, palpitations, or syncope.  They are recovering from their stroke with plans to CIR initially though with reports of significant improvement possible HHC at discharge.      Past Medical History:  Diagnosis Date  . Anxiety   . Bronchitis   . CAD (coronary artery disease)    a. 04/2008: cariac arrest with prox LAD occ s/p stenting, arctic sun. b. 11/2008: restenosis s/p repeat stenting. c. 01/2011: abnormal stress test but cath reportedly OK.  Marland Kitchen Hypothyroid   . Myocardial infarction [facility]) 2010   stents and cardiac rehab      Surgical History:  Past Surgical History:  Procedure Laterality Date  . ANGIOPLASTY  2010   2 stents (femoral)   . IR CT HEAD LTD  04/18/2020  . IR PERCUTANEOUS ART THROMBECTOMY/INFUSION INTRACRANIAL INC DIAG ANGIO  04/18/2020  . RADIOLOGY WITH ANESTHESIA N/A 04/18/2020   Procedure: IR WITH ANESTHESIA;  Surgeon: Radiologist, Medication, MD;  Location: [place];  Service: Radiology;  Laterality: N/A;  . TUBAL LIGATION       Medications Prior to Admission  Medication Sig Dispense Refill Last Dose  . aspirin EC 81 MG tablet Take 81 mg by mouth daily.   04/18/2020 at Unknown time  . Cholecalciferol 50 MCG (2000 UT) TABS Take 2,000 Units by mouth daily.   04/18/2020 at Unknown time  . citalopram (CELEXA) 20 MG tablet Take 20 mg by mouth daily.  04/18/2020 at Unknown time  . cyanocobalamin (,VITAMIN B-12,) 1000 MCG/ML injection Inject 1,000 mcg into the muscle every 30 (thirty) days.   03/14/2020   . levothyroxine (SYNTHROID) 75 MCG tablet Take 75 mcg by mouth daily.   04/18/2020 at Unknown time  . metoprolol succinate (TOPROL-XL) 25 MG 24 hr tablet Take 25 mg by mouth daily.   04/17/2020 at 2215  . nitroGLYCERIN (NITROSTAT) 0.4 MG SL tablet Place 0.4 mg under the tongue every 5 (five) minutes as needed for chest pain.   never  . pantoprazole (PROTONIX) 40 MG tablet Take 40 mg by mouth daily.   04/17/2020 at Unknown time  . ramipril (ALTACE) 5 MG capsule Take 5 mg by mouth daily.   04/17/2020 at Unknown time  . rosuvastatin (CRESTOR) 40 MG tablet Take 40 mg by mouth daily.   04/17/2020 at Unknown time    Inpatient Medications:  .  stroke: mapping our early stages of recovery book   Does not apply Once  . aspirin EC  81 mg Oral Daily  . chlorhexidine  15 mL Mouth Rinse BID  . citalopram  20 mg Oral Daily  . clopidogrel  75 mg Oral Daily  . cyanocobalamin  1,000 mcg Intramuscular Q30 days  . levothyroxine  75 mcg Oral Daily  . pantoprazole  40 mg Oral Daily  . rosuvastatin  40 mg Oral Daily  . senna-docusate  1 tablet Oral BID    Allergies:  Allergies  Allergen Reactions  . Sulfa Antibiotics Hives    Social History   Socioeconomic History  . Marital status: Married    Spouse name: Not on file  . Number of children: Not on file  . Years of education: Not on file  . Highest education level: Not on file  Occupational History  . Not on file  Tobacco Use  . Smoking status: Former Smoker    Packs/day: 1.50    Types: Cigarettes    Start date: 03/22/1973    Quit date: 04/27/2008    Years since quitting: 11.9  . Smokeless tobacco: Never Used  Vaping Use  . Vaping Use: Never used  Substance and Sexual Activity  . Alcohol use: No    Alcohol/week: 0.0 standard drinks  . Drug use: Never  . Sexual activity: Yes    Birth control/protection: Post-menopausal  Other Topics Concern  . Not on file  Social History Narrative  . Not on file   Social Determinants of Health    Financial Resource Strain: Not on file  Food Insecurity: Not on file  Transportation Needs: Not on file  Physical Activity: Not on file  Stress: Not on file  Social Connections: Not on file  Intimate Partner Violence: Not on file     Family History  Problem Relation Age of Onset  . Diabetes Mother   . Hypertension Mother   . Stroke Maternal Grandmother   . Diabetes Maternal Grandfather   . Stroke Maternal Grandfather       Review of Systems: All other systems reviewed and are otherwise negative except as noted above.  Physical Exam: Vitals:   04/22/20 2021 04/23/20 0029 04/23/20 0508 04/23/20 0719  BP: 131/64 121/66 128/67 118/82  Pulse: 79 95 95 96  Resp: 16 15 17 18   Temp: 97.6 F (36.4 C) 98.5 F (36.9 C) 98.2 F (36.8 C) 98.6 F (37 C)  TempSrc: Oral Oral Oral Oral  SpO2: 98% 99% 99% 100%  Weight:  GEN- The patient is well appearing, alert and oriented x 3 today.   Head- normocephalic, atraumatic Eyes-  Sclera clear, conjunctiva pink Ears- hearing intact Oropharynx- clear Neck- supple Lungs- CTA b/l, normal work of breathing Heart- RRR, no murmurs, rubs or gallops  GI- soft, NT, ND Extremities- no clubbing, cyanosis, or edema MS- no significant deformity or atrophy Skin- no rash or lesion Psych- euthymic mood, full affect   Labs:   Lab Results  Component Value Date   WBC 6.5 04/23/2020   HGB 11.3 (L) 04/23/2020   HCT 34.3 (L) 04/23/2020   MCV 87.3 04/23/2020   PLT 219 04/23/2020    Recent Labs  Lab 04/19/20 0500 04/20/20 0052 04/23/20 0052  NA 140   < > 140  K 3.5   < > 3.5  CL 111   < > 109  CO2 23   < > 25  BUN 12   < > 19  CREATININE 0.80   < > 0.85  CALCIUM 8.5*   < > 8.9  PROT 6.0*  --   --   BILITOT 0.7  --   --   ALKPHOS 46  --   --   ALT 16  --   --   AST 20  --   --   GLUCOSE 128*   < > 110*   < > = values in this interval not displayed.   Lab Results  Component Value Date   TROPONINI <0.30 06/13/2012   Lab  Results  Component Value Date   CHOL 135 04/19/2020   Lab Results  Component Value Date   HDL 73 04/19/2020   Lab Results  Component Value Date   LDLCALC 51 04/19/2020   Lab Results  Component Value Date   TRIG 55 04/19/2020   Lab Results  Component Value Date   CHOLHDL 1.8 04/19/2020   No results found for: LDLDIRECT  No results found for: DDIMER   Radiology/Studies:   CT ANGIO HEAD W OR WO CONTRAST Result Date: 04/18/2020 CLINICAL DATA:  Neuro deficit, acute, stroke suspected. Additional history provided: Speech difficulty, right facial numbness. EXAM: CT ANGIOGRAPHY HEAD AND NECK CT PERFUSION BRAIN TECHNIQUE: Multidetector CT imaging of the head and neck was performed using the standard protocol during bolus administration of intravenous contrast. Multiplanar CT image reconstructions and MIPs were obtained to evaluate the vascular anatomy. Carotid stenosis measurements (when applicable) are obtained utilizing NASCET criteria, using the distal internal carotid diameter as the denominator. Multiphase CT imaging of the brain was performed following IV bolus contrast injection. Subsequent parametric perfusion maps were calculated using RAPID software. CONTRAST:  126mL OMNIPAQUE IOHEXOL 350 MG/ML SOLN COMPARISON:  Report from brain MRI 12/15/1998 (images unavailable). FINDINGS: CTA NECK FINDINGS Aortic arch: Standard aortic branching. The visualized aortic arch is normal in caliber. Atherosclerotic plaque within the visualized aortic arch and proximal major branch vessels of the neck. No hemodynamically significant innominate or proximal subclavian artery stenosis. Right carotid system: CCA and ICA patent within the neck without stenosis or significant atherosclerotic disease. Calcified plaque within the proximal ECA. Left carotid system: CCA and ICA patent within the neck without stenosis stenosis. Trace calcified plaque within the carotid bifurcation. Vertebral arteries: Codominant and  patent within the neck without stenosis. Skeleton: Cervical spondylosis with multilevel disc space narrowing, disc bulges, uncovertebral hypertrophy. No acute bony abnormality or aggressive osseous lesion. Other neck: No neck mass or cervical lymphadenopathy. Upper chest: No consolidation within the imaged lung apices. Review of the  MIP images confirms the above findings CTA HEAD FINDINGS Anterior circulation: The intracranial internal carotid arteries are patent. Mild calcified plaque within both vessels without stenosis. The M1 middle cerebral arteries are patent. There is an occluded distal M2 left MCA branch vessel (series 10, image 52) (series 12, image 27). The anterior cerebral arteries are patent. No intracranial aneurysm is identified. Posterior circulation: The intracranial vertebral arteries are patent. The basilar artery is patent. The posterior cerebral arteries are patent. Posterior communicating arteries are hypoplastic or absent bilaterally. Venous sinuses: Within the limitations of contrast timing, no convincing thrombus. Anatomic variants: As described Review of the MIP images confirms the above findings CT Brain Perfusion Findings: CBF (<30%) Volume: 27mL Perfusion (Tmax>6.0s) volume: 79mL (posterior left MCA vascular territory) Mismatch Volume: 30mL Infarction Location:None identified These results were called by telephone at the time of interpretation on 04/18/2020 at 10:47 am to provider [facility] , who verbally acknowledged these results. IMPRESSION: CTA neck: The common carotid, internal carotid and vertebral arteries are patent within the neck without hemodynamically significant stenosis. CTA head: 1. Occluded distal M2 left MCA branch vessel. 2. Mild non-stenotic calcified plaque within the intracranial ICAs bilaterally. CT perfusion head: The perfusion software identifies a 16 mL region of hypoperfusion within the posterior left MCA territory utilizing the Tmax>6 seconds threshold. The  perfusion software identifies no core infarct. Mismatch volume: 16 mL. Electronically Signed   By: Kellie Simmering DO   On: 04/18/2020 10:59     CT HEAD WO CONTRAST Result Date: 04/20/2020 INDICATION: New onset global aphasia, with worsening right-sided weakness. Occluded inferior division left middle cerebral artery mid M2 segment on CT angiogram of the head and neck. EXAM: 1. EMERGENT LARGE VESSEL OCCLUSION THROMBOLYSIS (anterior CIRCULATION) COMPARISON:  CT angiogram of the head and neck of April 18, 2020. MEDICATIONS: Ancef 2 g IV antibiotic was administered within 1 hour of the procedure. ANESTHESIA/SEDATION: General anesthesia CONTRAST:  Isovue 300 approximately 125 mL FLUOROSCOPY TIME:  Fluoroscopy Time: 24 minutes 36 seconds (1652 mGy). COMPLICATIONS: None immediate. TECHNIQUE: Following a full explanation of the procedure along with the potential associated complications, an informed witnessed consent was obtained. The risks of intracranial hemorrhage of 10%, worsening neurological deficit, ventilator dependency, death and inability to revascularize were all reviewed in detail with the patient's spouse. The patient was then put under general anesthesia by the Department of Anesthesiology at [facility]. The right groin was prepped and draped in the usual sterile fashion. Thereafter using modified Seldinger technique, transfemoral access into the right common femoral artery was obtained without difficulty. Over a 0.035 inch guidewire an 8 French 25 cm Pinnacle sheath was inserted. Through this, and also over a 0.035 inch guidewire a 5 [place] JB 1 catheter was advanced to the aortic arch region and selectively positioned in the left common carotid artery. FINDINGS: Left common carotid arteriogram demonstrates the left external carotid artery and its major branches to be widely patent. The left internal carotid artery at the bulb to the cranial skull base is widely patent. The left middle cerebral  artery opacifies into the capillary and venous phases. Occlusion of the left middle cerebral artery inferior division prominent parietal branch is seen in the mid M2 to distal M2 region. This is associated with a moderate sized wedge shaped hypo perfusion defect. Left anterior cerebral artery opacifies into the capillary and venous phases. PROCEDURE: Diagnostic 5 [place] JB 1 catheter in the left common carotid artery was then exchanged for a 0.035 inch  300 cm Constance Holster exchange guidewire for an 087 balloon guide catheter which had been prepped with 50% contrast and 50% heparinized saline infusion and advanced to the mid cervical left ICA. The guidewire was removed. Good aspiration obtained from the hub of the balloon guide catheter. Through this, and over a 0.014 inch Synchro standard micro guidewire a combination of a 136 cm 071 Zoom aspiration catheter and an 021 162 cm Trevo microcatheter was advanced to the supraclinoid left ICA. Micro guidewire was then gently manipulated with a torque device and advanced without difficulty through the occluded mid to distal M2 segment with a wire and the microcatheter being advanced to the M3 M4 junction. The micro guidewire was removed. There was slow aspiration of blood. A gentle contrast injection through the microcatheter demonstrated slow antegrade flow distal to the microcatheter. The micro guidewire was reintroduced and advanced further distally with the microcatheter also advanced slightly distally. The guidewire was removed. Again there was moderately improved aspiration of blood at the hub of the microcatheter which was then connected to continuous heparinized saline infusion. The 071 Zoom aspiration catheter was advanced just inside the prominent proximal inferior division. With proximal flow arrest slow aspiration was performed with a 20 mL syringe at hub of the microcatheter, and with Penumbra aspiration device at the hub of the Zoom aspiration catheter for  approximately 1-1/2 minutes. Thereafter, the combination of the microcatheter, the Zoom aspiration device was then gently retrieved and removed. No aspiration was noted at the hub of the microcatheter. Flow arrest was then reversed. A gentle control arteriogram performed through the balloon guide catheter in the distal left internal carotid artery demonstrated patent the left MCA distribution with moderately improved recanalization of the previously noted occluded inferior division branch with the clot now noted more distally in the M3 M4 junction. Also noted was a moderate vasospasm of the left middle cerebral artery M1 segment. This responded to 2.5 mg of verapamil IA, and also 2 aliquots of 25 mcg of nitroglycerin intra-arterially. Given the increased risk of hemorrhagic complication with further mechanical thrombectomy of the small caliber of the distal occluded branch in the M3 M4 region it was not undertaken. Additional super selective IA infusion of tPA it was not performed for the same reason. The balloon guide was then retrieved into the left common carotid artery. Arteriogram performed through this continued to demonstrate wide patency of the left internal carotid artery proximally and distally with significantly improved caliber of the left middle cerebral artery due to vasospasm. Further recanalization of the previously occluded branch was noted on this arteriogram. A TICI 2C revascularization was achieved. The balloon guide was removed. An 8 French Angio-Seal hemostasis device was applied at the right groin puncture site. Distal pulses remained Dopplerable in all 4 extremities. A CT of the brain demonstrated cortical enhancement of the left frontotemporal, and left parietal regions probably representing hyperemia. Also noted was tubular high focal density in the distal cortical vasculature probably representing a vacuum phenomenon. No mass-effect or midline shift was noted. Patient's general anesthesia  was reversed and the patient was then extubated without event. The patient was able to move all 4 extremities with the right upper extremity weaker. Patient continued to be globally aphasic. She was then transferred to the neuro ICU for post revascularization care. IMPRESSION: Status post endovascular partial recanalization of occluded left middle cerebral artery inferior division branch in the mid M2 to distal M2 region to the M3 M4 region with contact aspiration and proximal  aspiration achieving a TICI 2C revascularization. PLAN: Follow-up in the clinic 2-4 weeks post discharge. Electronically Signed   By: Luanne Bras M.D.   On: 04/19/2020 11:48     CT HEAD WO CONTRAST Result Date: 04/19/2020 CLINICAL DATA:  Stroke post tPA and thrombectomy EXAM: CT HEAD WITHOUT CONTRAST TECHNIQUE: Contiguous axial images were obtained from the base of the skull through the vertex without intravenous contrast. COMPARISON:  [place] FINDINGS: Brain: There is no acute intracranial hemorrhage. A more well-defined area of hypoattenuation is present in the left parietal lobe. No mass effect. Ventricles are stable in size. Vascular: No new findings. Skull: Calvarium is unremarkable. Sinuses/Orbits: No acute finding. Other: None. IMPRESSION: No acute hemorrhage. Small more well-defined area of evolving acute infarction is present in the left parietal lobe. Electronically Signed   By: Macy Mis M.D.   On: 04/19/2020 11:53       MR ANGIO HEAD WO CONTRAST Result Date: 04/20/2020 CLINICAL DATA:  Stroke post tPA and thrombectomy EXAM: MRI HEAD WITHOUT CONTRAST MRA HEAD WITHOUT CONTRAST TECHNIQUE: Multiplanar, multiecho pulse sequences of the brain and surrounding structures were obtained without intravenous contrast. Angiographic images of the head were obtained using MRA technique without contrast. COMPARISON:  None. FINDINGS: MRI HEAD Brain: Moderate size area of reduced diffusion is present in the left parietal lobe  spanning cortex to posterior left lateral ventricle. Few additional areas of left parietal cortical infarction. No evidence of hemorrhagic transformation. No significant mass effect. Additional patchy foci of T2 hyperintensity in the supratentorial white matter are nonspecific but may reflect mild chronic microvascular ischemic changes. Ventricles and sulci are within normal limits in size and configuration. There is no intracranial mass. There is no hydrocephalus or extra-axial fluid collection. Vascular: Major vessel flow voids at the skull base are preserved. Skull and upper cervical spine: Normal marrow signal is preserved. Sinuses/Orbits: Paranasal sinuses are aerated. Orbits are unremarkable. Other: Sella is unremarkable.  Mastoid air cells are clear. MRA HEAD Intracranial internal carotid arteries are patent. Prox middle and anterior cerebral arteries are patent. Decreased flow related enhancement within the distal left M2/M3 at the site of thrombus on prior CTA. Intracranial vertebral arteries, basilar artery, proximal posterior cerebral arteries are patent. 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 Minimal additional small acute left parietal cortical infarcts. No evidence of hemorrhagic transformation. Mild chronic microvascular ischemic changes. Preserved but decreased flow related enhancement at the site of distal left M2/M3 MCA thrombus on prior CTA. Electronically Signed   By: Macy Mis M.D.   On: 04/20/2020 15:00     MR BRAIN WO CONTRAST Result Date: 04/20/2020 CLINICAL DATA:  Stroke post tPA and thrombectomy EXAM: MRI HEAD WITHOUT CONTRAST MRA HEAD WITHOUT CONTRAST TECHNIQUE: Multiplanar, multiecho pulse sequences of the brain and surrounding structures were obtained without intravenous contrast. Angiographic images of the head were obtained using MRA technique without contrast. COMPARISON:  None. FINDINGS: MRI HEAD Brain: Moderate size area of reduced diffusion  is present in the left parietal lobe spanning cortex to posterior left lateral ventricle. Few additional areas of left parietal cortical infarction. No evidence of hemorrhagic transformation. No significant mass effect. Additional patchy foci of T2 hyperintensity in the supratentorial white matter are nonspecific but may reflect mild chronic microvascular ischemic changes. Ventricles and sulci are within normal limits in size and configuration. There is no intracranial mass. There is no hydrocephalus or extra-axial fluid collection. Vascular: Major vessel flow voids at the skull base are preserved. Skull  and upper cervical spine: Normal marrow signal is preserved. Sinuses/Orbits: Paranasal sinuses are aerated. Orbits are unremarkable. Other: Sella is unremarkable.  Mastoid air cells are clear. MRA HEAD Intracranial internal carotid arteries are patent. Prox middle and anterior cerebral arteries are patent. Decreased flow related enhancement within the distal left M2/M3 at the site of thrombus on prior CTA. Intracranial vertebral arteries, basilar artery, proximal posterior cerebral arteries are patent. 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 Minimal additional small acute left parietal cortical infarcts. No evidence of hemorrhagic transformation. Mild chronic microvascular ischemic changes. Preserved but decreased flow related enhancement at the site of distal left M2/M3 MCA thrombus on prior CTA. Electronically Signed   By: Macy Mis M.D.   On: 04/20/2020 15:00    VAS [place] LOWER EXTREMITY VENOUS (DVT) Result Date: 04/19/2020  Lower Venous DVT Study Indications: Stroke.  Comparison Study: No prior Performing Technologist: Oda Cogan RDMS, RVT  Examination Guidelines: A complete evaluation includes B-mode imaging, spectral Doppler, color Doppler, and power Doppler as needed of all accessible portions of each vessel. Bilateral testing is considered an integral part of a  complete examination. Limited examinations for reoccurring indications may be performed as noted. The reflux portion of the exam is performed with the patient in reverse Trendelenburg.    Summary: BILATERAL: - No evidence of deep vein thrombosis seen in the lower extremities, bilaterally. -No evidence of popliteal cyst, bilaterally.   *See table(s) above for measurements and observations. Electronically signed by Monica Martinez MD on 04/19/2020 at 6:23:13 PM.    Final     12-lead ECG SR All prior EKG's in EPIC reviewed with no documented atrial fibrillation  Telemetry SR, rare PAT, significant artifact intermittently  Assessment and Plan:  1. Cryptogenic stroke The patient presents with cryptogenic stroke.  The patient has a TEE planned for this today.  Dr. Curt Bears spoke at length with the patient (and family at bedside) about monitoring for afib with either a 30 day event monitor or an implantable loop recorder.  Risks, benefits, and alteratives to implantable loop recorder were discussed with the patient today.   At this time, the patient is very clear in her decision to proceed with implantable loop recorder.   Wound care was reviewed with the patient (keep incision clean and dry for 3 days).  Wound check follow up will be scheduled for the patient.  Please call with questions.   Tracey Emanuelson Dyane Dustman, PA-C 04/23/2020

## 2020-04-23 NOTE — Progress Notes (Signed)
The pateint reported to dr. Quentin Ore and I that while she is very happy with her care with the [facility] cardiology team, she needs/wamts a cardiologist locally and would like to establish her general cardiology care with [place]. She has seen Dr. Acie Fredrickson during a hospital stay years ago, she remembers him and would like to see him.  I have made outpatient follow up with Dr. Acie Fredrickson on her behalf.  Tommye Standard, PA-C

## 2020-04-23 NOTE — CV Procedure (Signed)
    TRANSESOPHAGEAL ECHOCARDIOGRAM   NAME:  Tracey [name]    MRN: [id number] DOB:  04-Oct-1955    ADMIT DATE: 04/18/2020  INDICATIONS: Stroke  PROCEDURE:   Informed consent was obtained prior to the procedure. The risks, benefits and alternatives for the procedure were discussed and the patient comprehended these risks.  Risks include, but are not limited to, cough, sore throat, vomiting, nausea, somnolence, esophageal and stomach trauma or perforation, bleeding, low blood pressure, aspiration, pneumonia, infection, trauma to the teeth and death.    Procedural time out performed. The oropharynx was anesthetized with topical 1% benzocaine.    Anesthesia was administered by Dr. Ola Spurr.  The patient was administered 255 mg of propofol and 60 mg of lidocaine to achieve and maintain moderate conscious sedation.  The patient's heart rate, blood pressure, and oxygen saturation are monitored continuously during the procedure. The period of conscious sedation is 15 minutes, of which I was present face-to-face 100% of this time.   The transesophageal probe was inserted in the esophagus and stomach without difficulty and multiple views were obtained.   COMPLICATIONS:    There were no immediate complications.  KEY FINDINGS:  1. LVEF 45-50% with apical anterior hypokinesis.  2. No LAA thrombus.  3. Small to moderate size PFO with R to L shunting with valsalva.   4. Full report to follow. 5. Further management per primary team.   Addison Naegeli. Audie Box, MD, [place]  [address], [place], [place] [address] [phone]  2:37 PM

## 2020-04-23 NOTE — Progress Notes (Signed)
OT Cancellation Note  Patient Details Name: Tracey [name] MRN: [id number] DOB: April 25, 1955   Cancelled Treatment:    Reason Eval/Treat Not Completed: Patient at procedure or test/ unavailable;Other (comment) pt at TEE, will check back as time allows.  Corinne Ports K., COTA/L Acute Rehabilitation Services [phone]   Precious Haws 04/23/2020, 2:50 PM

## 2020-04-23 NOTE — Progress Notes (Signed)
  Echocardiogram 2D Echocardiogram has been performed.  Tracey [name] 04/23/2020, 3:09 PM

## 2020-04-23 NOTE — Consult Note (Signed)
ELECTROPHYSIOLOGY CONSULT NOTE  Patient ID: Tracey [name] MRN: [id number], DOB/AGE: [birth year]   Admit date: 04/18/2020 Date of Consult: 04/23/2020  Primary Physician: Rusty Aus, MD Primary Cardiologist: Dr. Charlane Ferretti [facility]) Reason for Consultation: Cryptogenic stroke; recommendations regarding Implantable Loop Recorder, requested by Dr. Erlinda Hong  History of Present Illness Tracey [name] was admitted on 04/18/2020 with acute aphasia, R sided weakness and hearing loss, acute stroke >> tPA >> emergent revascularization.    PMHx includes: CAD (04/2008 STEMI, c/b cardiogenic shock & cardiac arrest, PCI occluded LAD w/BMS,normal heart cath 01/16/2015 with no significant coronary disease, PCWP 7, CI 3.2.), Ischemic Cardiomyopathy (EF 45% 12/2014 ), PVD w/claudication, GERD, HTN, HLD, hypothyroidism  Neurology notes: Left MCA M2 branchstrokesecondary tolarge vesseldiseasevs. cardioembolic source.  she has undergone workup for stroke including echocardiogram and carotid angio.  The patient has been monitored on telemetry which has demonstrated sinus rhythm with no arrhythmias.  Inpatient stroke work-up is to be completed with a TEE.   Echocardiogram this admission demonstrated  IMPRESSIONS  1. Left ventricular ejection fraction, by estimation, is 45 to 50%. The  left ventricle has mildly decreased function. The left ventricle  demonstrates regional wall motion abnormalities (see scoring  diagram/findings for description). Mid to apical  anteroseptal akinesis. Left ventricular diastolic parameters are  indeterminate.  2. Right ventricular systolic function is normal. The right ventricular  size is normal. Tricuspid regurgitation signal is inadequate for assessing  PA pressure.  3. The mitral valve is normal in structure. No evidence of mitral valve  regurgitation.  4. The aortic valve was not well visualized. Aortic valve regurgitation  is not visualized. No aortic stenosis is  present.   2016 TTE LVEF 45% Closest EF: 45%(Estimated)Apical: HYPOCONTRACTILE   LV masses: No Masses Inferior: HYPOCONTRACTILE   LVH: None Posterior: Normal   LV Note: DISTAL ANTEROSEPTUM AKINETIC    Lab work is reviewed. She apparently had an episode of CP vs panic attach, HS Trop 10 and 10  Prior to admission, the patient denies chest pain, shortness of breath, dizziness, palpitations, or syncope.  They are recovering from their stroke with plans to CIR initially though with reports of significant improvement possible HHC at discharge.      Past Medical History:  Diagnosis Date  . Anxiety   . Bronchitis   . CAD (coronary artery disease)    a. 04/2008: cariac arrest with prox LAD occ s/p stenting, arctic sun. b. 11/2008: restenosis s/p repeat stenting. c. 01/2011: abnormal stress test but cath reportedly OK.  Marland Kitchen Hypothyroid   . Myocardial infarction [facility]) 2010   stents and cardiac rehab      Surgical History:  Past Surgical History:  Procedure Laterality Date  . ANGIOPLASTY  2010   2 stents (femoral)   . IR CT HEAD LTD  04/18/2020  . IR PERCUTANEOUS ART THROMBECTOMY/INFUSION INTRACRANIAL INC DIAG ANGIO  04/18/2020  . RADIOLOGY WITH ANESTHESIA N/A 04/18/2020   Procedure: IR WITH ANESTHESIA;  Surgeon: Radiologist, Medication, MD;  Location: [place];  Service: Radiology;  Laterality: N/A;  . TUBAL LIGATION       Medications Prior to Admission  Medication Sig Dispense Refill Last Dose  . aspirin EC 81 MG tablet Take 81 mg by mouth daily.   04/18/2020 at Unknown time  . Cholecalciferol 50 MCG (2000 UT) TABS Take 2,000 Units by mouth daily.   04/18/2020 at Unknown time  . citalopram (CELEXA) 20 MG tablet Take 20 mg by mouth daily.  04/18/2020 at Unknown time  . cyanocobalamin (,VITAMIN B-12,) 1000 MCG/ML injection Inject 1,000 mcg into the muscle every 30 (thirty) days.   03/14/2020   . levothyroxine (SYNTHROID) 75 MCG tablet Take 75 mcg by mouth daily.   04/18/2020 at Unknown time  . metoprolol succinate (TOPROL-XL) 25 MG 24 hr tablet Take 25 mg by mouth daily.   04/17/2020 at 2215  . nitroGLYCERIN (NITROSTAT) 0.4 MG SL tablet Place 0.4 mg under the tongue every 5 (five) minutes as needed for chest pain.   never  . pantoprazole (PROTONIX) 40 MG tablet Take 40 mg by mouth daily.   04/17/2020 at Unknown time  . ramipril (ALTACE) 5 MG capsule Take 5 mg by mouth daily.   04/17/2020 at Unknown time  . rosuvastatin (CRESTOR) 40 MG tablet Take 40 mg by mouth daily.   04/17/2020 at Unknown time    Inpatient Medications:  .  stroke: mapping our early stages of recovery book   Does not apply Once  . aspirin EC  81 mg Oral Daily  . chlorhexidine  15 mL Mouth Rinse BID  . citalopram  20 mg Oral Daily  . clopidogrel  75 mg Oral Daily  . cyanocobalamin  1,000 mcg Intramuscular Q30 days  . levothyroxine  75 mcg Oral Daily  . pantoprazole  40 mg Oral Daily  . rosuvastatin  40 mg Oral Daily  . senna-docusate  1 tablet Oral BID    Allergies:  Allergies  Allergen Reactions  . Sulfa Antibiotics Hives    Social History   Socioeconomic History  . Marital status: Married    Spouse name: Not on file  . Number of children: Not on file  . Years of education: Not on file  . Highest education level: Not on file  Occupational History  . Not on file  Tobacco Use  . Smoking status: Former Smoker    Packs/day: 1.50    Types: Cigarettes    Start date: 03/22/1973    Quit date: 04/27/2008    Years since quitting: 11.9  . Smokeless tobacco: Never Used  Vaping Use  . Vaping Use: Never used  Substance and Sexual Activity  . Alcohol use: No    Alcohol/week: 0.0 standard drinks  . Drug use: Never  . Sexual activity: Yes    Birth control/protection: Post-menopausal  Other Topics Concern  . Not on file  Social History Narrative  . Not on file   Social Determinants of Health    Financial Resource Strain: Not on file  Food Insecurity: Not on file  Transportation Needs: Not on file  Physical Activity: Not on file  Stress: Not on file  Social Connections: Not on file  Intimate Partner Violence: Not on file     Family History  Problem Relation Age of Onset  . Diabetes Mother   . Hypertension Mother   . Stroke Maternal Grandmother   . Diabetes Maternal Grandfather   . Stroke Maternal Grandfather       Review of Systems: All other systems reviewed and are otherwise negative except as noted above.  Physical Exam: Vitals:   04/22/20 2021 04/23/20 0029 04/23/20 0508 04/23/20 0719  BP: 131/64 121/66 128/67 118/82  Pulse: 79 95 95 96  Resp: 16 15 17 18   Temp: 97.6 F (36.4 C) 98.5 F (36.9 C) 98.2 F (36.8 C) 98.6 F (37 C)  TempSrc: Oral Oral Oral Oral  SpO2: 98% 99% 99% 100%  Weight:  GEN- The patient is well appearing, alert and oriented x 3 today.   Head- normocephalic, atraumatic Eyes-  Sclera clear, conjunctiva pink Ears- hearing intact Oropharynx- clear Neck- supple Lungs- CTA b/l, normal work of breathing Heart- RRR, no murmurs, rubs or gallops  GI- soft, NT, ND Extremities- no clubbing, cyanosis, or edema MS- no significant deformity or atrophy Skin- no rash or lesion Psych- euthymic mood, full affect   Labs:   Lab Results  Component Value Date   WBC 6.5 04/23/2020   HGB 11.3 (L) 04/23/2020   HCT 34.3 (L) 04/23/2020   MCV 87.3 04/23/2020   PLT 219 04/23/2020    Recent Labs  Lab 04/19/20 0500 04/20/20 0052 04/23/20 0052  NA 140   < > 140  K 3.5   < > 3.5  CL 111   < > 109  CO2 23   < > 25  BUN 12   < > 19  CREATININE 0.80   < > 0.85  CALCIUM 8.5*   < > 8.9  PROT 6.0*  --   --   BILITOT 0.7  --   --   ALKPHOS 46  --   --   ALT 16  --   --   AST 20  --   --   GLUCOSE 128*   < > 110*   < > = values in this interval not displayed.   Lab Results  Component Value Date   TROPONINI <0.30 06/13/2012   Lab  Results  Component Value Date   CHOL 135 04/19/2020   Lab Results  Component Value Date   HDL 73 04/19/2020   Lab Results  Component Value Date   LDLCALC 51 04/19/2020   Lab Results  Component Value Date   TRIG 55 04/19/2020   Lab Results  Component Value Date   CHOLHDL 1.8 04/19/2020   No results found for: LDLDIRECT  No results found for: DDIMER   Radiology/Studies:   CT ANGIO HEAD W OR WO CONTRAST Result Date: 04/18/2020 CLINICAL DATA:  Neuro deficit, acute, stroke suspected. Additional history provided: Speech difficulty, right facial numbness. EXAM: CT ANGIOGRAPHY HEAD AND NECK CT PERFUSION BRAIN TECHNIQUE: Multidetector CT imaging of the head and neck was performed using the standard protocol during bolus administration of intravenous contrast. Multiplanar CT image reconstructions and MIPs were obtained to evaluate the vascular anatomy. Carotid stenosis measurements (when applicable) are obtained utilizing NASCET criteria, using the distal internal carotid diameter as the denominator. Multiphase CT imaging of the brain was performed following IV bolus contrast injection. Subsequent parametric perfusion maps were calculated using RAPID software. CONTRAST:  162mL OMNIPAQUE IOHEXOL 350 MG/ML SOLN COMPARISON:  Report from brain MRI 12/15/1998 (images unavailable). FINDINGS: CTA NECK FINDINGS Aortic arch: Standard aortic branching. The visualized aortic arch is normal in caliber. Atherosclerotic plaque within the visualized aortic arch and proximal major branch vessels of the neck. No hemodynamically significant innominate or proximal subclavian artery stenosis. Right carotid system: CCA and ICA patent within the neck without stenosis or significant atherosclerotic disease. Calcified plaque within the proximal ECA. Left carotid system: CCA and ICA patent within the neck without stenosis stenosis. Trace calcified plaque within the carotid bifurcation. Vertebral arteries: Codominant and  patent within the neck without stenosis. Skeleton: Cervical spondylosis with multilevel disc space narrowing, disc bulges, uncovertebral hypertrophy. No acute bony abnormality or aggressive osseous lesion. Other neck: No neck mass or cervical lymphadenopathy. Upper chest: No consolidation within the imaged lung apices. Review of the  MIP images confirms the above findings CTA HEAD FINDINGS Anterior circulation: The intracranial internal carotid arteries are patent. Mild calcified plaque within both vessels without stenosis. The M1 middle cerebral arteries are patent. There is an occluded distal M2 left MCA branch vessel (series 10, image 52) (series 12, image 27). The anterior cerebral arteries are patent. No intracranial aneurysm is identified. Posterior circulation: The intracranial vertebral arteries are patent. The basilar artery is patent. The posterior cerebral arteries are patent. Posterior communicating arteries are hypoplastic or absent bilaterally. Venous sinuses: Within the limitations of contrast timing, no convincing thrombus. Anatomic variants: As described Review of the MIP images confirms the above findings CT Brain Perfusion Findings: CBF (<30%) Volume: 27mL Perfusion (Tmax>6.0s) volume: 6mL (posterior left MCA vascular territory) Mismatch Volume: 83mL Infarction Location:None identified These results were called by telephone at the time of interpretation on 04/18/2020 at 10:47 am to provider [employer] , who verbally acknowledged these results. IMPRESSION: CTA neck: The common carotid, internal carotid and vertebral arteries are patent within the neck without hemodynamically significant stenosis. CTA head: 1. Occluded distal M2 left MCA branch vessel. 2. Mild non-stenotic calcified plaque within the intracranial ICAs bilaterally. CT perfusion head: The perfusion software identifies a 16 mL region of hypoperfusion within the posterior left MCA territory utilizing the Tmax>6 seconds threshold. The  perfusion software identifies no core infarct. Mismatch volume: 16 mL. Electronically Signed   By: Kellie Simmering DO   On: 04/18/2020 10:59     CT HEAD WO CONTRAST Result Date: 04/20/2020 INDICATION: New onset global aphasia, with worsening right-sided weakness. Occluded inferior division left middle cerebral artery mid M2 segment on CT angiogram of the head and neck. EXAM: 1. EMERGENT LARGE VESSEL OCCLUSION THROMBOLYSIS (anterior CIRCULATION) COMPARISON:  CT angiogram of the head and neck of April 18, 2020. MEDICATIONS: Ancef 2 g IV antibiotic was administered within 1 hour of the procedure. ANESTHESIA/SEDATION: General anesthesia CONTRAST:  Isovue 300 approximately 125 mL FLUOROSCOPY TIME:  Fluoroscopy Time: 24 minutes 36 seconds (1652 mGy). COMPLICATIONS: None immediate. TECHNIQUE: Following a full explanation of the procedure along with the potential associated complications, an informed witnessed consent was obtained. The risks of intracranial hemorrhage of 10%, worsening neurological deficit, ventilator dependency, death and inability to revascularize were all reviewed in detail with the patient's spouse. The patient was then put under general anesthesia by the Department of Anesthesiology at [facility]. The right groin was prepped and draped in the usual sterile fashion. Thereafter using modified Seldinger technique, transfemoral access into the right common femoral artery was obtained without difficulty. Over a 0.035 inch guidewire an 8 French 25 cm Pinnacle sheath was inserted. Through this, and also over a 0.035 inch guidewire a 5 [place] JB 1 catheter was advanced to the aortic arch region and selectively positioned in the left common carotid artery. FINDINGS: Left common carotid arteriogram demonstrates the left external carotid artery and its major branches to be widely patent. The left internal carotid artery at the bulb to the cranial skull base is widely patent. The left middle cerebral  artery opacifies into the capillary and venous phases. Occlusion of the left middle cerebral artery inferior division prominent parietal branch is seen in the mid M2 to distal M2 region. This is associated with a moderate sized wedge shaped hypo perfusion defect. Left anterior cerebral artery opacifies into the capillary and venous phases. PROCEDURE: Diagnostic 5 [place] JB 1 catheter in the left common carotid artery was then exchanged for a 0.035 inch  300 cm Constance Holster exchange guidewire for an 087 balloon guide catheter which had been prepped with 50% contrast and 50% heparinized saline infusion and advanced to the mid cervical left ICA. The guidewire was removed. Good aspiration obtained from the hub of the balloon guide catheter. Through this, and over a 0.014 inch Synchro standard micro guidewire a combination of a 136 cm 071 Zoom aspiration catheter and an 021 162 cm Trevo microcatheter was advanced to the supraclinoid left ICA. Micro guidewire was then gently manipulated with a torque device and advanced without difficulty through the occluded mid to distal M2 segment with a wire and the microcatheter being advanced to the M3 M4 junction. The micro guidewire was removed. There was slow aspiration of blood. A gentle contrast injection through the microcatheter demonstrated slow antegrade flow distal to the microcatheter. The micro guidewire was reintroduced and advanced further distally with the microcatheter also advanced slightly distally. The guidewire was removed. Again there was moderately improved aspiration of blood at the hub of the microcatheter which was then connected to continuous heparinized saline infusion. The 071 Zoom aspiration catheter was advanced just inside the prominent proximal inferior division. With proximal flow arrest slow aspiration was performed with a 20 mL syringe at hub of the microcatheter, and with Penumbra aspiration device at the hub of the Zoom aspiration catheter for  approximately 1-1/2 minutes. Thereafter, the combination of the microcatheter, the Zoom aspiration device was then gently retrieved and removed. No aspiration was noted at the hub of the microcatheter. Flow arrest was then reversed. A gentle control arteriogram performed through the balloon guide catheter in the distal left internal carotid artery demonstrated patent the left MCA distribution with moderately improved recanalization of the previously noted occluded inferior division branch with the clot now noted more distally in the M3 M4 junction. Also noted was a moderate vasospasm of the left middle cerebral artery M1 segment. This responded to 2.5 mg of verapamil IA, and also 2 aliquots of 25 mcg of nitroglycerin intra-arterially. Given the increased risk of hemorrhagic complication with further mechanical thrombectomy of the small caliber of the distal occluded branch in the M3 M4 region it was not undertaken. Additional super selective IA infusion of tPA it was not performed for the same reason. The balloon guide was then retrieved into the left common carotid artery. Arteriogram performed through this continued to demonstrate wide patency of the left internal carotid artery proximally and distally with significantly improved caliber of the left middle cerebral artery due to vasospasm. Further recanalization of the previously occluded branch was noted on this arteriogram. A TICI 2C revascularization was achieved. The balloon guide was removed. An 8 French Angio-Seal hemostasis device was applied at the right groin puncture site. Distal pulses remained Dopplerable in all 4 extremities. A CT of the brain demonstrated cortical enhancement of the left frontotemporal, and left parietal regions probably representing hyperemia. Also noted was tubular high focal density in the distal cortical vasculature probably representing a vacuum phenomenon. No mass-effect or midline shift was noted. Patient's general anesthesia  was reversed and the patient was then extubated without event. The patient was able to move all 4 extremities with the right upper extremity weaker. Patient continued to be globally aphasic. She was then transferred to the neuro ICU for post revascularization care. IMPRESSION: Status post endovascular partial recanalization of occluded left middle cerebral artery inferior division branch in the mid M2 to distal M2 region to the M3 M4 region with contact aspiration and proximal  aspiration achieving a TICI 2C revascularization. PLAN: Follow-up in the clinic 2-4 weeks post discharge. Electronically Signed   By: Luanne Bras M.D.   On: 04/19/2020 11:48     CT HEAD WO CONTRAST Result Date: 04/19/2020 CLINICAL DATA:  Stroke post tPA and thrombectomy EXAM: CT HEAD WITHOUT CONTRAST TECHNIQUE: Contiguous axial images were obtained from the base of the skull through the vertex without intravenous contrast. COMPARISON:  [place] FINDINGS: Brain: There is no acute intracranial hemorrhage. A more well-defined area of hypoattenuation is present in the left parietal lobe. No mass effect. Ventricles are stable in size. Vascular: No new findings. Skull: Calvarium is unremarkable. Sinuses/Orbits: No acute finding. Other: None. IMPRESSION: No acute hemorrhage. Small more well-defined area of evolving acute infarction is present in the left parietal lobe. Electronically Signed   By: Macy Mis M.D.   On: 04/19/2020 11:53       MR ANGIO HEAD WO CONTRAST Result Date: 04/20/2020 CLINICAL DATA:  Stroke post tPA and thrombectomy EXAM: MRI HEAD WITHOUT CONTRAST MRA HEAD WITHOUT CONTRAST TECHNIQUE: Multiplanar, multiecho pulse sequences of the brain and surrounding structures were obtained without intravenous contrast. Angiographic images of the head were obtained using MRA technique without contrast. COMPARISON:  None. FINDINGS: MRI HEAD Brain: Moderate size area of reduced diffusion is present in the left parietal lobe  spanning cortex to posterior left lateral ventricle. Few additional areas of left parietal cortical infarction. No evidence of hemorrhagic transformation. No significant mass effect. Additional patchy foci of T2 hyperintensity in the supratentorial white matter are nonspecific but may reflect mild chronic microvascular ischemic changes. Ventricles and sulci are within normal limits in size and configuration. There is no intracranial mass. There is no hydrocephalus or extra-axial fluid collection. Vascular: Major vessel flow voids at the skull base are preserved. Skull and upper cervical spine: Normal marrow signal is preserved. Sinuses/Orbits: Paranasal sinuses are aerated. Orbits are unremarkable. Other: Sella is unremarkable.  Mastoid air cells are clear. MRA HEAD Intracranial internal carotid arteries are patent. Prox middle and anterior cerebral arteries are patent. Decreased flow related enhancement within the distal left M2/M3 at the site of thrombus on prior CTA. Intracranial vertebral arteries, basilar artery, proximal posterior cerebral arteries are patent. 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 Minimal additional small acute left parietal cortical infarcts. No evidence of hemorrhagic transformation. Mild chronic microvascular ischemic changes. Preserved but decreased flow related enhancement at the site of distal left M2/M3 MCA thrombus on prior CTA. Electronically Signed   By: Macy Mis M.D.   On: 04/20/2020 15:00     MR BRAIN WO CONTRAST Result Date: 04/20/2020 CLINICAL DATA:  Stroke post tPA and thrombectomy EXAM: MRI HEAD WITHOUT CONTRAST MRA HEAD WITHOUT CONTRAST TECHNIQUE: Multiplanar, multiecho pulse sequences of the brain and surrounding structures were obtained without intravenous contrast. Angiographic images of the head were obtained using MRA technique without contrast. COMPARISON:  None. FINDINGS: MRI HEAD Brain: Moderate size area of reduced diffusion  is present in the left parietal lobe spanning cortex to posterior left lateral ventricle. Few additional areas of left parietal cortical infarction. No evidence of hemorrhagic transformation. No significant mass effect. Additional patchy foci of T2 hyperintensity in the supratentorial white matter are nonspecific but may reflect mild chronic microvascular ischemic changes. Ventricles and sulci are within normal limits in size and configuration. There is no intracranial mass. There is no hydrocephalus or extra-axial fluid collection. Vascular: Major vessel flow voids at the skull base are preserved. Skull  and upper cervical spine: Normal marrow signal is preserved. Sinuses/Orbits: Paranasal sinuses are aerated. Orbits are unremarkable. Other: Sella is unremarkable.  Mastoid air cells are clear. MRA HEAD Intracranial internal carotid arteries are patent. Prox middle and anterior cerebral arteries are patent. Decreased flow related enhancement within the distal left M2/M3 at the site of thrombus on prior CTA. Intracranial vertebral arteries, basilar artery, proximal posterior cerebral arteries are patent. 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 Minimal additional small acute left parietal cortical infarcts. No evidence of hemorrhagic transformation. Mild chronic microvascular ischemic changes. Preserved but decreased flow related enhancement at the site of distal left M2/M3 MCA thrombus on prior CTA. Electronically Signed   By: Macy Mis M.D.   On: 04/20/2020 15:00    VAS [place] LOWER EXTREMITY VENOUS (DVT) Result Date: 04/19/2020  Lower Venous DVT Study Indications: Stroke.  Comparison Study: No prior Performing Technologist: Oda Cogan RDMS, RVT  Examination Guidelines: A complete evaluation includes B-mode imaging, spectral Doppler, color Doppler, and power Doppler as needed of all accessible portions of each vessel. Bilateral testing is considered an integral part of a  complete examination. Limited examinations for reoccurring indications may be performed as noted. The reflux portion of the exam is performed with the patient in reverse Trendelenburg.    Summary: BILATERAL: - No evidence of deep vein thrombosis seen in the lower extremities, bilaterally. -No evidence of popliteal cyst, bilaterally.   *See table(s) above for measurements and observations. Electronically signed by Monica Martinez MD on 04/19/2020 at 6:23:13 PM.    Final     12-lead ECG SR All prior EKG's in EPIC reviewed with no documented atrial fibrillation  Telemetry SR, rare PAT, significant artifact intermittently  Assessment and Plan:  1. Cryptogenic stroke The patient presents with cryptogenic stroke.  The patient has a TEE planned for this today.  Dr. Curt Bears spoke at length with the patient (and family at bedside) about monitoring for afib with either a 30 day event monitor or an implantable loop recorder.  Risks, benefits, and alteratives to implantable loop recorder were discussed with the patient today.   At this time, the patient is very clear in her decision to proceed with implantable loop recorder.   Wound care was reviewed with the patient (keep incision clean and dry for 3 days).  Wound check follow up will be scheduled for the patient.  Please call with questions.   Renee Dyane Dustman, PA-C 04/23/2020

## 2020-04-24 ENCOUNTER — Telehealth: Payer: Self-pay | Admitting: Physician Assistant

## 2020-04-24 ENCOUNTER — Encounter (HOSPITAL_COMMUNITY): Payer: Self-pay

## 2020-04-24 ENCOUNTER — Other Ambulatory Visit: Payer: Self-pay

## 2020-04-24 ENCOUNTER — Emergency Department (HOSPITAL_COMMUNITY)
Admission: EM | Admit: 2020-04-24 | Discharge: 2020-04-24 | Disposition: A | Discharge status: Home / Self Care | Payer: 59 | Attending: Emergency Medicine | Admitting: Emergency Medicine

## 2020-04-24 ENCOUNTER — Emergency Department (HOSPITAL_BASED_OUTPATIENT_CLINIC_OR_DEPARTMENT_OTHER): Payer: 59

## 2020-04-24 ENCOUNTER — Emergency Department (HOSPITAL_COMMUNITY): Payer: 59

## 2020-04-24 DIAGNOSIS — I808 Phlebitis and thrombophlebitis of other sites: Secondary | ICD-10-CM | POA: Insufficient documentation

## 2020-04-24 DIAGNOSIS — Z79899 Other long term (current) drug therapy: Secondary | ICD-10-CM | POA: Insufficient documentation

## 2020-04-24 DIAGNOSIS — Z7982 Long term (current) use of aspirin: Secondary | ICD-10-CM | POA: Diagnosis not present

## 2020-04-24 DIAGNOSIS — S40021A Contusion of right upper arm, initial encounter: Secondary | ICD-10-CM | POA: Insufficient documentation

## 2020-04-24 DIAGNOSIS — I10 Essential (primary) hypertension: Secondary | ICD-10-CM | POA: Diagnosis not present

## 2020-04-24 DIAGNOSIS — I2510 Atherosclerotic heart disease of native coronary artery without angina pectoris: Secondary | ICD-10-CM | POA: Diagnosis not present

## 2020-04-24 DIAGNOSIS — S4991XA Unspecified injury of right shoulder and upper arm, initial encounter: Secondary | ICD-10-CM | POA: Diagnosis present

## 2020-04-24 DIAGNOSIS — Z87891 Personal history of nicotine dependence: Secondary | ICD-10-CM | POA: Insufficient documentation

## 2020-04-24 DIAGNOSIS — E039 Hypothyroidism, unspecified: Secondary | ICD-10-CM | POA: Diagnosis not present

## 2020-04-24 DIAGNOSIS — Z20822 Contact with and (suspected) exposure to covid-19: Secondary | ICD-10-CM | POA: Insufficient documentation

## 2020-04-24 DIAGNOSIS — R059 Cough, unspecified: Secondary | ICD-10-CM | POA: Insufficient documentation

## 2020-04-24 DIAGNOSIS — R350 Frequency of micturition: Secondary | ICD-10-CM | POA: Insufficient documentation

## 2020-04-24 DIAGNOSIS — L03114 Cellulitis of left upper limb: Secondary | ICD-10-CM | POA: Diagnosis not present

## 2020-04-24 DIAGNOSIS — X58XXXA Exposure to other specified factors, initial encounter: Secondary | ICD-10-CM | POA: Diagnosis not present

## 2020-04-24 DIAGNOSIS — L0390 Cellulitis, unspecified: Secondary | ICD-10-CM

## 2020-04-24 DIAGNOSIS — L538 Other specified erythematous conditions: Secondary | ICD-10-CM | POA: Diagnosis not present

## 2020-04-24 LAB — URINALYSIS, ROUTINE W REFLEX MICROSCOPIC
Bacteria, UA: NONE SEEN
Bilirubin Urine: NEGATIVE
Glucose, UA: NEGATIVE mg/dL
Ketones, ur: NEGATIVE mg/dL
Nitrite: NEGATIVE
Protein, ur: NEGATIVE mg/dL
Specific Gravity, Urine: 1.006 (ref 1.005–1.030)
pH: 5 (ref 5.0–8.0)

## 2020-04-24 LAB — SARS CORONAVIRUS 2 (TAT 6-24 HRS): SARS Coronavirus 2: NEGATIVE

## 2020-04-24 LAB — COMPREHENSIVE METABOLIC PANEL
ALT: 16 U/L (ref 0–44)
AST: 20 U/L (ref 15–41)
Albumin: 3.3 g/dL — ABNORMAL LOW (ref 3.5–5.0)
Alkaline Phosphatase: 48 U/L (ref 38–126)
Anion gap: 10 (ref 5–15)
BUN: 7 mg/dL — ABNORMAL LOW (ref 8–23)
CO2: 21 mmol/L — ABNORMAL LOW (ref 22–32)
Calcium: 8.8 mg/dL — ABNORMAL LOW (ref 8.9–10.3)
Chloride: 107 mmol/L (ref 98–111)
Creatinine, Ser: 0.77 mg/dL (ref 0.44–1.00)
GFR, Estimated: >60 mL/min (ref >60)
Glucose, Bld: 103 mg/dL — ABNORMAL HIGH (ref 70–99)
Potassium: 3.9 mmol/L (ref 3.5–5.1)
Sodium: 138 mmol/L (ref 135–145)
Total Bilirubin: 0.7 mg/dL (ref 0.3–1.2)
Total Protein: 6.4 g/dL — ABNORMAL LOW (ref 6.5–8.1)

## 2020-04-24 LAB — CBC WITH DIFFERENTIAL/PLATELET
Abs Immature Granulocytes: 0.02 10*3/uL (ref 0.00–0.07)
Basophils Absolute: 0 10*3/uL (ref 0.0–0.1)
Basophils Relative: 1 %
Eosinophils Absolute: 0 10*3/uL (ref 0.0–0.5)
Eosinophils Relative: 1 %
HCT: 39.2 % (ref 36.0–46.0)
Hemoglobin: 12.6 g/dL (ref 12.0–15.0)
Immature Granulocytes: 1 %
Lymphocytes Relative: 16 %
Lymphs Abs: 0.6 10*3/uL — ABNORMAL LOW (ref 0.7–4.0)
MCH: 28.7 pg (ref 26.0–34.0)
MCHC: 32.1 g/dL (ref 30.0–36.0)
MCV: 89.3 fL (ref 80.0–100.0)
Monocytes Absolute: 0.2 10*3/uL (ref 0.1–1.0)
Monocytes Relative: 4 %
Neutro Abs: 2.9 10*3/uL (ref 1.7–7.7)
Neutrophils Relative %: 77 %
Platelets: 191 10*3/uL (ref 150–400)
RBC: 4.39 MIL/uL (ref 3.87–5.11)
RDW: 15 % (ref 11.5–15.5)
WBC: 3.7 10*3/uL — ABNORMAL LOW (ref 4.0–10.5)
nRBC: 0 % (ref 0.0–0.2)

## 2020-04-24 LAB — LACTIC ACID, PLASMA
Lactic Acid, Venous: 1 mmol/L (ref 0.5–1.9)
Lactic Acid, Venous: 1.4 mmol/L (ref 0.5–1.9)

## 2020-04-24 MED ORDER — DOXYCYCLINE HYCLATE 100 MG PO CAPS: 100.0000 mg | ORAL_CAPSULE | Freq: Two times a day (BID) | ORAL | 0 refills | Status: DC

## 2020-04-24 NOTE — ED Notes (Signed)
Vascular at bedside

## 2020-04-24 NOTE — ED Notes (Signed)
Pt d/c home per MD order. Discharge summary reviewed with pt, pt verbalizes understanding. No s/s of acute distress noted at discharge. Pt husband discharge ride home.

## 2020-04-24 NOTE — ED Triage Notes (Signed)
Patient just discharged following stroke with IR procedure. Reports chills in the hospital and now has fever and chills with left arm pain where PIV was placed. Redness noted to left arm. Patient has residual expressive aphasia following stroke. Also complains of urinary frequency

## 2020-04-24 NOTE — Discharge Instructions (Signed)
You have been seen and discharged from the emergency department.  You have been diagnosed with superficial thrombophlebitis of the left upper extremity.  Treat this symptomatically with elevation, warm compresses.  You have been placed on an antibiotic for possible cellulitis with the low-grade fever.  Your Covid swab is pending.  Follow-up with your scheduled outpatient follow-up for reevaluation and further care. Take home medications as prescribed. If you have any worsening symptoms, uncontrolled fevers, worsening swelling/redness at the loop recorder site or further concerns for health please return to an emergency department for further evaluation.

## 2020-04-24 NOTE — Telephone Encounter (Signed)
   The patient's husband called the answering service after-hours today. Patient was just admitted 3/23-3/28 with acute stroke, felt cardioembolic, requiring TPA and emergent revascularization. TEE showed EF 45-50% with apical anterior hypokinesis, small to moderate size PFO with R-L shunting, no LAA thrombus, no mention of vegetations. She had loop recorder implant. Discharged yesterday Yesterday afternoon the patient had chills throughout the day, but did not develop a fever until last night. She also has a mild cough and a knot developed near a prior IV site. The chills resolved but she continues to have a temp up to 101.5 despite administration of Tylenol q6hr. From a cardiac standpoint I think the likelihood this is related to her loop recorder is low especially since chills occurred even yesterday on day of implant. They deny any pus or increasing redness at the site. They did not call any other service yet because our number was most accessible. Given recent stroke, cough, and persistent fever as well as concerns about her prior IV site, I think she is best served being evaluated back in the emergency department where she can have expedited infection workup. Her loop site can also be evaluated by the EDP as well. The patient's husband verbalized understanding and gratitude and will bring her to the hospital.  Will route to Dr. Quentin Ore just so he is aware.  Charlie Pitter, PA-C

## 2020-04-24 NOTE — ED Provider Notes (Signed)
[facility] EMERGENCY DEPARTMENT Provider Note   CSN: [id number] Arrival date & time: 04/24/20  4098     History Chief Complaint  Patient presents with  . Fever    Tracey [name] is a 65 y.o. female.  HPI   65 year old female past medical history of CAD status post MI and stents, HTN, HLD, hypothyroidism and recent admission/discharge after being found with a left M2 stroke that was intervened with IR presents the emergency department concern for low-grade fever.  Patient was just discharged yesterday, did have a loop provider placed prior to you have been seen and discharged from the emergency department.  Follow-up with your primary provider for reevaluation and further care. Take home medications as prescribed. If you have any worsening symptoms or further concerns for health please return to an emergency department for further evaluation..  Husband is at bedside and assists with history as the patient is still aphasic at times.  He states when they got home last night she had a low-grade fever around 100, he gave Tylenol before she went to bed.  They woke up overnight with a 101 fever, he gave Tylenol again but when she woke up this morning still was running a fever around 101.  They called their outpatient doctors who recommended returning here for evaluation.  Patient has had a slight cough since being admitted in the hospital but denies any acute change or phlegm production.  She endorses urinary frequency but denies any dysuria/hematuria.  She had a peripheral IV in the left upper extremity and there is some redness and tenderness around that site in the left upper extremity.  He also has some bruising around the loop insertion site with some mild redness.  Past Medical History:  Diagnosis Date  . Anxiety   . Bronchitis   . CAD (coronary artery disease)    a. 04/2008: cariac arrest with prox LAD occ s/p stenting, arctic sun. b. 11/2008: restenosis s/p repeat  stenting. c. 01/2011: abnormal stress test but cath reportedly OK.  Marland Kitchen Hypothyroid   . Myocardial infarction [facility]) 2010   stents and cardiac rehab     Patient Active Problem List   Diagnosis Date Noted  . Acute stroke due to ischemia ([place]) 04/18/2020  . Middle cerebral artery embolism, left 04/18/2020  . Atherosclerosis of native arteries of extremity with intermittent claudication ([place]) 10/05/2019  . GERD (gastroesophageal reflux disease) 09/26/2019  . Low serum vitamin D 08/25/2018  . B12 deficiency 11/21/2015  . Acquired hypothyroidism 08/09/2015  . Cardiomyopathy, ischemic 08/09/2015  . Hyperlipidemia, mixed 08/09/2015  . Dizziness 06/13/2012  . CAD (coronary artery disease) 06/13/2012  . H/O irritable bowel syndrome 10/20/2011    Past Surgical History:  Procedure Laterality Date  . ANGIOPLASTY  2010   2 stents (femoral)   . IR CT HEAD LTD  04/18/2020  . IR PERCUTANEOUS ART THROMBECTOMY/INFUSION INTRACRANIAL INC DIAG ANGIO  04/18/2020  . LOOP RECORDER INSERTION N/A 04/23/2020   Procedure: LOOP RECORDER INSERTION;  Surgeon: Vickie Epley, MD;  Location: [facility] CV LAB;  Service: Cardiovascular;  Laterality: N/A;  . RADIOLOGY WITH ANESTHESIA N/A 04/18/2020   Procedure: IR WITH ANESTHESIA;  Surgeon: Radiologist, Medication, MD;  Location: [facility];  Service: Radiology;  Laterality: N/A;  . TUBAL LIGATION       OB History   No obstetric history on file.     Family History  Problem Relation Age of Onset  . Diabetes Mother   . Hypertension  Mother   . Stroke Maternal Grandmother   . Diabetes Maternal Grandfather   . Stroke Maternal Grandfather     Social History   Tobacco Use  . Smoking status: Former Smoker    Packs/day: 1.50    Types: Cigarettes    Start date: 03/22/1973    Quit date: 04/27/2008    Years since quitting: 12.0  . Smokeless tobacco: Never Used  Vaping Use  . Vaping Use: Never used  Substance Use Topics  . Alcohol use: No    Alcohol/week: 0.0  standard drinks  . Drug use: Never    Home Medications Prior to Admission medications   Medication Sig Start Date End Date Taking? Authorizing Provider  aspirin EC 81 MG EC tablet Take 1 tablet (81 mg total) by mouth daily for 21 days. Swallow whole. Stop taking after 05/10/2020 04/19/20 05/10/20  Briant Cedar, MD  Cholecalciferol 50 MCG (2000 UT) TABS Take 2,000 Units by mouth daily.    [provider]  citalopram (CELEXA) 20 MG tablet Take 20 mg by mouth daily. 04/17/20   [provider]  clopidogrel (PLAVIX) 75 MG tablet Take 1 tablet (75 mg total) by mouth daily. 04/23/20 07/22/20  Briant Cedar, MD  cyanocobalamin (,VITAMIN B-12,) 1000 MCG/ML injection Inject 1,000 mcg into the muscle every 30 (thirty) days. 10/17/15   [provider]  levothyroxine (SYNTHROID) 75 MCG tablet Take 75 mcg by mouth daily. 04/17/20   [provider]  metoprolol succinate (TOPROL-XL) 25 MG 24 hr tablet Take 25 mg by mouth daily.    [provider]  nitroGLYCERIN (NITROSTAT) 0.4 MG SL tablet Place 0.4 mg under the tongue every 5 (five) minutes as needed for chest pain.    [provider]  pantoprazole (PROTONIX) 40 MG tablet Take 40 mg by mouth daily.    [provider]  ramipril (ALTACE) 5 MG capsule Take 5 mg by mouth daily.    [provider]  rosuvastatin (CRESTOR) 40 MG tablet Take 40 mg by mouth daily.    [provider]    Allergies    Sulfa antibiotics  Review of Systems   Review of Systems  Constitutional: Positive for chills and fever.  HENT: Negative for congestion.   Eyes: Negative for visual disturbance.  Respiratory: Positive for cough. Negative for shortness of breath.   Cardiovascular: Negative for chest pain.  Gastrointestinal: Negative for abdominal pain, diarrhea and vomiting.  Genitourinary: Positive for frequency. Negative for dysuria.  Skin: Positive for color change.  Neurological: Negative  for headaches.    Physical Exam Updated Vital Signs BP 118/77   Pulse 97   Temp 98.2 F (36.8 C) (Oral)   Resp (!) 29   SpO2 99%   Physical Exam Vitals and nursing note reviewed.  Constitutional:      Appearance: Normal appearance.  HENT:     Head: Normocephalic.     Mouth/Throat:     Mouth: Mucous membranes are moist.  Eyes:     Pupils: Pupils are equal, round, and reactive to light.  Cardiovascular:     Rate and Rhythm: Normal rate.     Comments: Left breast loop recorder insertion site has surrounding ecchymosis and slight redness, no warmth, no palpated induration or fluctuance Pulmonary:     Effort: Pulmonary effort is normal. No respiratory distress.     Breath sounds: No rales.  Abdominal:     Palpations: Abdomen is soft.     Tenderness: There is no  abdominal tenderness.  Musculoskeletal:     Cervical back: No rigidity or tenderness.     Comments: Area of cellulitis and induration on the left forearm tracking up to the left bicep area that is tender to palpation, ecchymosis in the right upper extremity but no redness/cellulitis, peripheral perfusion in the extremities is normal  Skin:    General: Skin is warm.  Neurological:     Mental Status: She is alert and oriented to person, place, and time.     Comments: Periods of aphasia which is now baseline  Psychiatric:        Mood and Affect: Mood normal.     ED Results / Procedures / Treatments   Labs (all labs ordered are listed, but only abnormal results are displayed) Labs Reviewed  COMPREHENSIVE METABOLIC PANEL - Abnormal; Notable for the following components:      Result Value   CO2 21 (*)    Glucose, Bld 103 (*)    BUN 7 (*)    Calcium 8.8 (*)    Total Protein 6.4 (*)    Albumin 3.3 (*)    All other components within normal limits  CBC WITH DIFFERENTIAL/PLATELET - Abnormal; Notable for the following components:   WBC 3.7 (*)    Lymphs Abs 0.6 (*)    All other components within normal limits   URINALYSIS, ROUTINE W REFLEX MICROSCOPIC - Abnormal; Notable for the following components:   Color, Urine STRAW (*)    Hgb urine dipstick SMALL (*)    Leukocytes,Ua TRACE (*)    All other components within normal limits  LACTIC ACID, PLASMA  LACTIC ACID, PLASMA    EKG None  Radiology ECHO TEE  Result Date: 04/23/2020    TRANSESOPHOGEAL ECHO REPORT   Patient Name:   TAHNI PORCHIA Date of Exam: 04/23/2020 Medical Rec #:  [id number]         Height:       65.0 in Accession #:    [id number]        Weight:       210.0 lb Date of Birth:  11/24/1955         BSA:          2.020 m Patient Age:    37 years          BP:           110/58 mmHg Patient Gender: F                 HR:           93 bpm. Exam Location:  Inpatient Procedure: Transesophageal Echo, Color Doppler, Cardiac Doppler, 3D Echo and            Saline Contrast Bubble Study Indications:     Stroke I63.9  History:         Patient has prior history of Echocardiogram examinations, most                  recent 04/18/2020. Previous Myocardial Infarction and CAD.  Sonographer:     Mikki Santee RDCS (AE) Referring Phys:  [id number] Alphonsa Overall MCDANIEL Diagnosing Phys: Eleonore Chiquito MD PROCEDURE: After discussion of the risks and benefits of a TEE, an informed consent was obtained from the patient. TEE procedure time was 15 minutes. The transesophogeal probe was passed without difficulty through the esophogus of the patient. Imaged were obtained with the patient in a left lateral decubitus position. Local oropharyngeal anesthetic was provided with  Cetacaine. Sedation performed by different physician. The patient was monitored while under deep sedation. Anesthestetic sedation was provided intravenously by Anesthesiology: 255mg  of Propofol, 60mg  of Lidocaine. Image quality was excellent. The patient's vital signs; including heart rate, blood pressure, and oxygen saturation; remained stable throughout the procedure. The patient developed no complications during  the procedure. IMPRESSIONS  1. 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 Bubble study is positive, consistent with a small to moderate size PFO. Agitated saline contrast bubble study was positive with shunting observed within 3-6 cardiac cycles suggestive of interatrial shunt. There is a moderately sized patent foramen ovale with predominantly right to left shunting across the atrial septum.  2. No left atrial/left atrial appendage thrombus was detected. The LAA emptying velocity was 58 cm/s.  3. Left ventricular ejection fraction, by estimation, is 50 to 55%. The left ventricle has low normal function. The left ventricle demonstrates regional wall motion abnormalities (see scoring diagram/findings for description).  4. Right ventricular systolic function is normal. The right ventricular size is normal.  5. The mitral valve is grossly normal. Trivial mitral valve regurgitation. No evidence of mitral stenosis.  6. The aortic valve is tricuspid. Aortic valve regurgitation is not visualized. No aortic stenosis is present.  7. There is Moderate (Grade III) layered plaque involving the descending aorta and transverse aorta. FINDINGS  Left Ventricle: Left ventricular ejection fraction, by estimation, is 50 to 55%. The left ventricle has low normal function. The left ventricle demonstrates regional wall motion abnormalities. The left ventricular internal cavity size was normal in size.  LV Wall Scoring: The apical septal segment and apex are akinetic. Right Ventricle: The right ventricular size is normal. No increase in right ventricular wall thickness. Right ventricular systolic function is normal. Left Atrium: Left atrial size was normal in size. No left atrial/left atrial appendage thrombus was detected. The LAA emptying velocity was 58 cm/s. Right Atrium: Right atrial size was normal in size. Pericardium: There is no evidence of pericardial effusion. Mitral Valve: The mitral valve is  grossly normal. Trivial mitral valve regurgitation. No evidence of mitral valve stenosis. Tricuspid Valve: The tricuspid valve is grossly normal. Tricuspid valve regurgitation is mild . No evidence of tricuspid stenosis. Aortic Valve: The aortic valve is tricuspid. Aortic valve regurgitation is not visualized. No aortic stenosis is present. Pulmonic Valve: The pulmonic valve was grossly normal. Pulmonic valve regurgitation is not visualized. No evidence of pulmonic stenosis. Aorta: The aortic root and ascending aorta are structurally normal, with no evidence of dilitation. There is moderate (Grade III) layered plaque involving the descending aorta and transverse aorta. Venous: The right upper pulmonary vein, right lower pulmonary vein, left lower pulmonary vein and left upper pulmonary vein are normal. IAS/Shunts: The interatrial septum is aneurysmal. The atrial septum is grossly normal. Agitated saline contrast was given intravenously to evaluate for intracardiac shunting. Agitated saline contrast bubble study was positive with shunting observed within 3-6 cardiac cycles suggestive of interatrial shunt. A moderately sized patent foramen ovale is detected with predominantly right to left shunting across the atrial septum. 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 Bubble study is positive, consistent with a small to moderate size PFO.   AORTA Ao Root diam: 2.38 cm Ao Asc diam:  2.94 cm TRICUSPID VALVE TR Peak grad:   14.6 mmHg TR Vmax:        191.00 cm/s Eleonore Chiquito MD Electronically signed by Eleonore Chiquito MD Signature Date/Time:  04/23/2020/3:47:24 PM    Final     Procedures Procedures   Medications Ordered in ED Medications - No data to display  ED Course  I have reviewed the triage vital signs and the nursing notes.  Pertinent labs & imaging results that were available during my care of the patient were reviewed by me and considered in my medical decision making  (see chart for details).    MDM Rules/Calculators/A&P                          65 year old female presents the emergency department concern for low-grade fever.  She is afebrile. She has area of redness on the left upper extremity.  Work is very reassuring, no abnormal leukocytosis/shift, lactic acid is normal, very mild abnormalities on the chemistry, urinalysis does not look like UTI.  Chest x-ray shows no pneumonia.  Patient does have areas of redness in the left upper extremity that are warm and tender to the touch.  Suspicious for cellulitis, did do an ultrasound to rule out DVT, I did identify superficial thrombophlebitis that could be a component of the redness/warmth.  Given her history of the fever will place her on antibiotic for possible cellulitis.  Had a long discussion with the family in regards to symptomatic treatment of the superficial thrombophlebitis.  The loop recorder insertion site at this time really does look appropriate, I have low suspicion for acute infection in regards to that but discussed with the family certain things to look out for.  They already have outpatient follow-up scheduled.  No other signs of acute illness warranting further emergent imaging.  Will repeat a Covid/flu swab since she was just admitted for a week.  Patient will be discharged and treated as an outpatient.  Discharge plan and strict return to ED precautions discussed, patient verbalizes understanding and agreement.  Final Clinical Impression(s) / ED Diagnoses Final diagnoses:  None    Rx / DC Orders ED Discharge Orders    None       Lorelle Gibbs, DO 04/24/20 1311

## 2020-04-24 NOTE — Progress Notes (Signed)
Upper extremity venous LT study completed.  Preliminary results relayed to [place], DO.   See CV Proc for preliminary results report.   Darlin Coco, RDMS, RVT

## 2020-04-30 ENCOUNTER — Other Ambulatory Visit: Payer: Self-pay

## 2020-04-30 ENCOUNTER — Ambulatory Visit: Payer: 59 | Attending: Internal Medicine | Admitting: Speech Pathology

## 2020-04-30 DIAGNOSIS — R4701 Aphasia: Secondary | ICD-10-CM | POA: Diagnosis present

## 2020-04-30 DIAGNOSIS — R482 Apraxia: Secondary | ICD-10-CM | POA: Insufficient documentation

## 2020-04-30 DIAGNOSIS — I6602 Occlusion and stenosis of left middle cerebral artery: Secondary | ICD-10-CM | POA: Diagnosis present

## 2020-04-30 NOTE — Patient Instructions (Signed)
Apps to download for iPad:   Free: [employer] :  [place] your ipad when you come and I can assign you some appropriate tasks.  Free trial (2-3 weeks), then paid subscription [employer]

## 2020-05-01 ENCOUNTER — Encounter: Payer: Self-pay | Admitting: Speech Pathology

## 2020-05-01 NOTE — Therapy (Signed)
[place] MAIN [facility] SERVICES [address] [place], [place], [address] Phone: [phone]   Fax:  [phone]  Speech Language Pathology Evaluation  Patient Details  Name: Tracey [name] MRN: [id number] Date of Birth: [birth year] Referring Provider (SLP): Emily Filbert, MD   Encounter Date: 04/30/2020   End of Session - 05/01/20 1721    Visit Number 1    Number of Visits 25    Date for SLP Re-Evaluation 07/29/20    Authorization Type AET[employer] 30 visits for ST max    Authorization Time Period 04/30/20    Authorization - Visit Number 1    Progress Note Due on Visit 10    SLP Start Time 1000    SLP Stop Time  1115    SLP Time Calculation (min) 75 min    Activity Tolerance Patient tolerated treatment well           Past Medical History:  Diagnosis Date  . Anxiety   . Bronchitis   . CAD (coronary artery disease)    a. 04/2008: cariac arrest with prox LAD occ s/p stenting, arctic sun. b. 11/2008: restenosis s/p repeat stenting. c. 01/2011: abnormal stress test but cath reportedly OK.  Marland Kitchen Hypothyroid   . Myocardial infarction [facility]) 2010   stents and cardiac rehab     Past Surgical History:  Procedure Laterality Date  . ANGIOPLASTY  2010   2 stents (femoral)   . BUBBLE STUDY  04/23/2020   Procedure: BUBBLE STUDY;  Surgeon: Geralynn Rile, MD;  Location: [place];  Service: Cardiovascular;;  . IR CT HEAD LTD  04/18/2020  . IR PERCUTANEOUS ART THROMBECTOMY/INFUSION INTRACRANIAL INC DIAG ANGIO  04/18/2020  . LOOP RECORDER INSERTION N/A 04/23/2020   Procedure: LOOP RECORDER INSERTION;  Surgeon: Vickie Epley, MD;  Location: [place] [facility];  Service: Cardiovascular;  Laterality: N/A;  . RADIOLOGY WITH ANESTHESIA N/A 04/18/2020   Procedure: IR WITH ANESTHESIA;  Surgeon: Radiologist, Medication, MD;  Location: [place];  Service: Radiology;  Laterality: N/A;  . TEE WITHOUT CARDIOVERSION N/A 04/23/2020   Procedure: TRANSESOPHAGEAL  ECHOCARDIOGRAM (TEE);  Surgeon: Geralynn Rile, MD;  Location: [place];  Service: Cardiovascular;  Laterality: N/A;  . TUBAL LIGATION      There were no vitals filed for this visit.   Subjective Assessment - 05/01/20 1708    Subjective "Gice to see you."    Patient is accompained by: Family member   daughter [name]   Currently in Pain? No/denies              SLP Evaluation [facility] - 05/01/20 1708      SLP Visit Information   SLP Received On 04/30/20    Referring Provider (SLP) Emily Filbert, MD    Onset Date 04/18/20    Medical Diagnosis CVA      General Information   HPI Tracey [name] is a 65 y.o. female with a PMHx of CAD s/p MI, cardiac arrest, HTN, hypothryoidism, HLD, and anxiety. Presented to [facility] ED with slurred speech and word finding difficulty. CTA head and neck showed a distal M2 occlusion in left MCA branch. Per chart tPA emergently stopped when increased dysarthria noted> emergent head CT showed no intracranial hemorrhage. Pt transferred to [facility] and underwent revascularization of occluded Lt MCA following left MCA embolism. Seen by SLP during acute stay with diagnosis of global aphasia; however verbal output much improved by d/c on 04/20/20.    Behavioral/Cognition alert, cooperative,  pleasant    Mobility Status ambulated unassisted      Balance Screen   Has the patient fallen in the past 6 months No    Has the patient had a decrease in activity level because of a fear of falling?  No    Is the patient reluctant to leave their home because of a fear of falling?  No      Prior Functional Status   Cognitive/Linguistic Baseline Within functional limits    Type of Home House     Lives With Spouse    Available Support Family    Vocation Full time employment   accounts payable     Pain Assessment   Pain Assessment No/denies pain      Cognition   Overall Cognitive Status Within Functional Limits for tasks assessed    Attention Selective    Selective  Attention Appears intact    Memory Appears intact    Awareness Appears intact    Behaviors Other (comment)   anxious     Auditory Comprehension   Overall Auditory Comprehension Appears within functional limits for tasks assessed    Yes/No Questions Within Functional Limits    Basic Biographical Questions 76-100% accurate   100%   Commands Within Functional Limits    One Step Basic Commands 75-100% accurate   100%   Two Step Basic Commands 75-100% accurate   100%   Multistep Basic Commands 75-100% accurate   100%   Complex Commands 75-100% accurate   100%   Conversation Moderately complex      Visual Recognition/Discrimination   Discrimination Within Function Limits      Reading Comprehension   Reading Status Impaired    Sentence Level 76-100% accurate   90%; 100% with self correction   Paragraph Level Not tested   will assess further   Functional Environmental (signs, name badge) Within functional limits      Expression   Primary Mode of Expression Verbal      Verbal Expression   Overall Verbal Expression Impaired    Initiation No impairment    Automatic Speech Name;Social Response    Level of Generative/Spontaneous Verbalization Conversation    Repetition Impaired    Level of Impairment Sentence level    Naming Impairment    Confrontation --   58/60 object naming   Divergent --   13 animals in 60 seconds   Verbal Errors Phonemic paraphasias;Aware of errors;Not aware of errors;Inconsistent   apraxic errors as well   Pragmatics No impairment    Effective Techniques Semantic cues;Sentence completion;Phonemic cues;Open ended questions    Non-Verbal Means of Communication Not applicable      Written Expression   Dominant Hand Right    Written Expression Exceptions to [facility]    Self Formulation Ability Paragraph    Overall Writen Expression writes name and address. mild spelling errors, preposition omitted in paragraph level writing.      Oral Motor/Sensory Function    Overall Oral Motor/Sensory Function Appears within functional limits for tasks assessed    Labial ROM Within Functional Limits    Labial Symmetry Within Functional Limits    Labial Coordination Reduced    Lingual Symmetry Within Functional Limits    Lingual Strength Within Functional Limits    Lingual Coordination WFL    Facial ROM Within Functional Limits    Facial Symmetry Within Functional Limits    Facial Strength Within Functional Limits    Velum Within Functional Limits  Motor Speech   Overall Motor Speech Impaired    Respiration Within functional limits    Phonation Normal    Resonance Within functional limits    Articulation Within functional limitis    Intelligibility Intelligible    Motor Planning Impaired    Level of Impairment Word    Motor Speech Errors Inconsistent;Groping for words;Aware   multisyllabic words and longer utterances   Effective Techniques Slow rate    Phonation WFL      Standardized Assessments   Standardized Assessments  Western Aphasia Battery revised         Western Aphasia Battery- Revised  Spontaneous Speech                           Information content               9/10                                            Fluency                                 9/10                                          Comprehension     Yes/No questions                 60/60                                           Auditory Word Recognition  60/60                                Sequential Commands       80/80                              Repetition                             90/100                                        Naming    Object Naming                     58/60                                           Word Fluency                        13/20  Sentence Completion          10/10                                             Responsive Speech              9/10                                         Aphasia  Quotient                  92/100         Pt's severity rating was mild as indicated by an Aphasia Quotient of 92 (0-25=very severe, 26-50=severe, 51-75=moderate, 76 and above is mild). Pt's presentation is most consistent with anomic subtype, characterized by mildy impaired verbal expression, good auditory comprehension, and mildly impaired repetition. Reading and writing are impaired at mod complex paragraph level. Pt's verbal expression is characterized by phonemic and semantic paraphasias (ex: "gice/nice, Bidle/bible, paddles/payables, cardiologist ---> dermistologist/dermatologist"). Oral motor assessment does not reveal any non-verbal oral apraxia, however verbal apraxia present with multisyllabic words (cat/catnip/catapult/catstatstophe). Pt has been working at home on her speech with her daughter, who is also a Astronomer, but is eager to work in outpatient setting to maximize her language exposure/recovery.        SLP Education - 05/01/20 1721    Education Details [employer] and [employer]    Person(s) Educated Patient;Child(ren)    Methods Explanation;Handout    Comprehension Verbalized understanding            SLP Short Term Goals - 05/01/20 1724      SLP SHORT TERM GOAL #1   Title Patient will complete high-level wordfinding tasks >90% accuracy.    Time 10    Period --   sessions   Status New      SLP SHORT TERM GOAL #2   Title Patient will read multisyllabic words using compensations for apraxia >90% accuracy.    Time 10    Period Weeks    Status New      SLP SHORT TERM GOAL #3   Title Patient will demonstrate comprehension of paragraphs 8-10 sentences by answering questions >90% accuracy.    Time 10    Period --   sessions   Status New      SLP SHORT TERM GOAL #4   Title Patient will use compensations for aphasia and apraxia in 5-8 minutes mod complex conversation and self-correct breakdowns >90% of the time with modified independence.    Time 10     Period --   sessions   Status New            SLP Long Term Goals - 05/01/20 1728      SLP LONG TERM GOAL #1   Title Patient will report use of strategies for aphasia/apraxia to successfully make phone calls related to health, finances, and/or job functions.    Time 12    Period Weeks    Status New    Target Date 07/29/20      SLP LONG TERM GOAL #2   Title Patient will comprehend work-type emails 100% accuracy with use of compensations if needed (text-to-speech, etc).    Time 12  Period Weeks    Status New    Target Date 07/29/20      SLP LONG TERM GOAL #3   Title Patient will write work-type emails and self-correct >95% of errors with compensations allowed (speech-to-text, text-to-speech, double-checking).    Time 12    Period Weeks    Status New    Target Date 07/29/20      SLP LONG TERM GOAL #4   Title Patient will use compensations for anomia and apraxia functionally in 20+ minutes complex conversation, repairing breakdowns independently.    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 Patient has made significant improvements, with excellent language recovery after initial presentation of global aphasia during acute hospitalization. Today pt is perceptibly aphasic, but demonstrates good awareness (~75%) of verbal errors. Speech is fluent, with occasional hesitations, wordfinding difficulties, and characterized by occasional phonemic paraphasias as well as apraxic errors (these increase with length/complexity of utterance). Auditory comprehension appears functional. Repetition is impaired, however apraxia is a contributing factor. Her aphasia quotient per Western Aphasia Battery-Revised is 92/100 (mild). Began assessment of reading and writing. These appear grossly functional for simple communication, but  insufficient for higher-level communication related to job functions. 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 Patient desires to return to work and is eager to maximize her language recovery to participate in community activities such as bible study. I recommend skilled ST for aphasia and apraxia to maximize verbal and written expression as well as reading comprehension.    Speech Therapy Frequency 2x / week    Duration 12 weeks    Treatment/Interventions Language facilitation;Environmental controls;Cueing hierarchy;SLP instruction and feedback;Functional tasks;Compensatory strategies;Cognitive reorganization;Internal/external aids;Multimodal communcation approach;Patient/family education    Potential to Achieve Goals Good    Potential Considerations Family/community support    SLP Home Exercise Plan see pt instructions    Consulted and Agree with Plan of Care Patient;Family member/caregiver           Patient will benefit from skilled therapeutic intervention in order to improve the following deficits and impairments:   Aphasia  Verbal apraxia  Middle cerebral artery embolism, left    Problem List Patient Active Problem List   Diagnosis Date Noted  . Acute stroke due to ischemia ([place]) 04/18/2020  . Middle cerebral artery embolism, left 04/18/2020  . Atherosclerosis of native arteries of extremity with intermittent claudication ([place]) 10/05/2019  . GERD (gastroesophageal reflux disease) 09/26/2019  . Low serum vitamin D 08/25/2018  . B12 deficiency 11/21/2015  . Acquired hypothyroidism 08/09/2015  . Cardiomyopathy, ischemic 08/09/2015  . Hyperlipidemia, mixed 08/09/2015  . Dizziness 06/13/2012  . CAD (coronary artery disease) 06/13/2012  . H/O irritable bowel syndrome 10/20/2011   Deneise Lever, [place], CCC-SLP Speech-Language Pathologist  Aliene Altes 05/01/2020, 5:43 PM  [place] MAIN [facility]  SERVICES [address] [place], [place], [address] Phone: [phone]   Fax:  [phone]  Name: Tracey [name] MRN: [id number] Date of Birth: [birth year]

## 2020-05-03 ENCOUNTER — Ambulatory Visit (INDEPENDENT_AMBULATORY_CARE_PROVIDER_SITE_OTHER): Payer: 59 | Admitting: Emergency Medicine

## 2020-05-03 ENCOUNTER — Other Ambulatory Visit: Payer: Self-pay

## 2020-05-03 DIAGNOSIS — I639 Cerebral infarction, unspecified: Secondary | ICD-10-CM

## 2020-05-03 LAB — CUP PACEART INCLINIC DEVICE CHECK
Date Time Interrogation Session: 20220407164851
Implantable Pulse Generator Implant Date: 20220328

## 2020-05-03 NOTE — Progress Notes (Signed)
ILR wound check in clinic. Steri strips removed by patient prior to visit. Wound well healed. Home monitor transmitting nightly. No episodes. Questions answered.  Patient is enrolled in remote monitoring, transmitting nightly.  Monthly summary reports to Dr. Quentin Ore.  ROV as needed.

## 2020-05-07 ENCOUNTER — Other Ambulatory Visit: Payer: Self-pay

## 2020-05-07 ENCOUNTER — Ambulatory Visit: Payer: 59 | Admitting: Speech Pathology

## 2020-05-07 DIAGNOSIS — I6602 Occlusion and stenosis of left middle cerebral artery: Secondary | ICD-10-CM

## 2020-05-07 DIAGNOSIS — R4701 Aphasia: Secondary | ICD-10-CM | POA: Diagnosis not present

## 2020-05-07 DIAGNOSIS — R482 Apraxia: Secondary | ICD-10-CM

## 2020-05-08 NOTE — Therapy (Signed)
[place] MAIN [facility] SERVICES [address] [place], [place], [address] Phone: [phone]   Fax:  [phone]  Speech Language Pathology Treatment  Patient Details  Name: Tracey [name] MRN: [id number] Date of Birth: 10/01/1955 Referring Provider (SLP): Emily Filbert, MD   Encounter Date: 05/07/2020   End of Session - 05/08/20 1030    Visit Number 2    Number of Visits 25    Date for SLP Re-Evaluation 07/29/20    Authorization Type AETNA 60 visits for ST max    Authorization Time Period 04/30/20    Authorization - Visit Number 2    Progress Note Due on Visit 10    SLP Start Time 1000    SLP Stop Time  1100    SLP Time Calculation (min) 60 min    Activity Tolerance Patient tolerated treatment well           Past Medical History:  Diagnosis Date  . Anxiety   . Bronchitis   . CAD (coronary artery disease)    a. 04/2008: cariac arrest with prox LAD occ s/p stenting, arctic sun. b. 11/2008: restenosis s/p repeat stenting. c. 01/2011: abnormal stress test but cath reportedly OK.  Marland Kitchen Dysplastic nevus 11/04/2007   Ant. chest. Slight to moderate atypia, extends to one edge.   Marland Kitchen Dysplastic nevus 01/03/2009   Left breast. Minimal atypia, close to margin.  Marland Kitchen Hypothyroid   . Myocardial infarction [facility]) 2010   stents and cardiac rehab     Past Surgical History:  Procedure Laterality Date  . ANGIOPLASTY  2010   2 stents (femoral)   . BUBBLE STUDY  04/23/2020   Procedure: BUBBLE STUDY;  Surgeon: Geralynn Rile, MD;  Location: [place];  Service: Cardiovascular;;  . IR CT HEAD LTD  04/18/2020  . IR PERCUTANEOUS ART THROMBECTOMY/INFUSION INTRACRANIAL INC DIAG ANGIO  04/18/2020  . LOOP RECORDER INSERTION N/A 04/23/2020   Procedure: LOOP RECORDER INSERTION;  Surgeon: Vickie Epley, MD;  Location: [place] CV LAB;  Service: Cardiovascular;  Laterality: N/A;  . RADIOLOGY WITH ANESTHESIA N/A 04/18/2020   Procedure: IR WITH ANESTHESIA;   Surgeon: Radiologist, Medication, MD;  Location: [place];  Service: Radiology;  Laterality: N/A;  . TEE WITHOUT CARDIOVERSION N/A 04/23/2020   Procedure: TRANSESOPHAGEAL ECHOCARDIOGRAM (TEE);  Surgeon: Geralynn Rile, MD;  Location: [place];  Service: Cardiovascular;  Laterality: N/A;  . TUBAL LIGATION      There were no vitals filed for this visit.   Subjective Assessment - 05/08/20 1021    Subjective Went back to church yesterday for first time since CVA    Patient is accompained by: Family member   daughter [name]   Currently in Pain? No/denies                 ADULT SLP TREATMENT - 05/08/20 0845      General Information   Behavior/Cognition Alert;Cooperative;Pleasant mood    HPI Tracey [name] is a 65 y.o. female with a PMHx of CAD s/p MI, cardiac arrest, HTN, hypothryoidism, HLD, and anxiety. Presented to [facility] ED with slurred speech and word finding difficulty. CTA head and neck showed a distal M2 occlusion in left MCA branch. Per chart tPA emergently stopped when increased dysarthria noted> emergent head CT showed no intracranial hemorrhage. Pt transferred to [facility] and underwent revascularization of occluded Lt MCA following left MCA embolism. Seen by SLP during acute stay with diagnosis of global aphasia; however verbal output  much improved by d/c on 04/20/20.      Treatment Provided   Treatment provided Cognitive-Linquistic      Pain Assessment   Pain Assessment No/denies pain      Cognitive-Linquistic Treatment   Treatment focused on Apraxia;Aphasia;Patient/family/caregiver education    Skilled Treatment Patient reported fatigue after having several conversations at church yesterday. Continues with paraphasia, apraxic errors in conversation. She downloaded [employer] and tried some activities; requesting to increase difficulty level. SLP worked with pt to ID appropriate tasks. With functional phrase repetition (video model), pt accuracy on initial  attempt 60%; improved with slower rate and min-mod cues fading to supervision. Educated that time pressure can negatively impact performance, encouraged to take a break and focus on going slowly. Targeted sentence level reading comprehension with sentence-picture matching (100% accuracy with extended time), mod complex sentence completion (multisyllabic) 100% accuracy. Pt read aloud with occasional min-mod cues for slowing rate, correcting apraxic errors. Naming less common objects 90% accuracy. Sentence scramble (8-10 words) 100% accuracy with extra time. Pt reported errors in text messaging; trained pt in use of accessibility features for reading comprehension (text-to-speech) and speech-to-text to assist with message composition. Pt composed text to her daughter and sent without awareness of error until message playback. She used text-to-speech to check her next attempt prior to sending and was able to make her own correction.      Assessment / Recommendations / Plan   Plan Continue with current plan of care      Progression Toward Goals   Progression toward goals Progressing toward goals            SLP Education - 05/08/20 1030    Education Details accessibility features to aid reading comprehension/ written expression    Person(s) Educated Patient;Child(ren)    Methods Explanation;Demonstration;Verbal cues    Comprehension Returned demonstration;Verbalized understanding;Verbal cues required;Need further instruction            SLP Short Term Goals - 05/01/20 1724      SLP SHORT TERM GOAL #1   Title Patient will complete high-level wordfinding tasks >90% accuracy.    Time 10    Period --   sessions   Status New      SLP SHORT TERM GOAL #2   Title Patient will read multisyllabic words using compensations for apraxia >90% accuracy.    Time 10    Period Weeks    Status New      SLP SHORT TERM GOAL #3   Title Patient will demonstrate comprehension of paragraphs 8-10 sentences by  answering questions >90% accuracy.    Time 10    Period --   sessions   Status New      SLP SHORT TERM GOAL #4   Title Patient will use compensations for aphasia and apraxia in 5-8 minutes mod complex conversation and self-correct breakdowns >90% of the time with modified independence.    Time 10    Period --   sessions   Status New            SLP Long Term Goals - 05/01/20 1728      SLP LONG TERM GOAL #1   Title Patient will report use of strategies for aphasia/apraxia to successfully make phone calls related to health, finances, and/or job functions.    Time 12    Period Weeks    Status New    Target Date 07/29/20      SLP LONG TERM GOAL #2   Title  Patient will comprehend work-type emails 100% accuracy with use of compensations if needed (text-to-speech, etc).    Time 12    Period Weeks    Status New    Target Date 07/29/20      SLP LONG TERM GOAL #3   Title Patient will write work-type emails and self-correct >95% of errors with compensations allowed (speech-to-text, text-to-speech, double-checking).    Time 12    Period Weeks    Status New    Target Date 07/29/20      SLP LONG TERM GOAL #4   Title Patient will use compensations for anomia and apraxia functionally in 20+ minutes complex conversation, repairing breakdowns independently.    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 Phonemic paraphasias and apraxic errors persist in conversation; improve with slowing rate. Patient often attempts correction of errors. Reading comprehension/written expression are suspected to be impaired at mod-complex sentence level; further assessment to be completed. Pt demonstrates emerging ability/willingness to use compensations. I recommend skilled ST for aphasia and apraxia to maximize verbal and written expression as well  as reading comprehension.    Speech Therapy Frequency 2x / week    Duration 12 weeks    Treatment/Interventions Language facilitation;Environmental controls;Cueing hierarchy;SLP instruction and feedback;Functional tasks;Compensatory strategies;Cognitive reorganization;Internal/external aids;Multimodal communcation approach;Patient/family education    Potential to Achieve Goals Good    Potential Considerations Family/community support    SLP Home Exercise Plan see pt instructions    Consulted and Agree with Plan of Care Patient;Family member/caregiver           Patient will benefit from skilled therapeutic intervention in order to improve the following deficits and impairments:   Aphasia  Verbal apraxia  Middle cerebral artery embolism, left    Problem List Patient Active Problem List   Diagnosis Date Noted  . Acute stroke due to ischemia ([place]) 04/18/2020  . Middle cerebral artery embolism, left 04/18/2020  . Atherosclerosis of native arteries of extremity with intermittent claudication ([place]) 10/05/2019  . GERD (gastroesophageal reflux disease) 09/26/2019  . Low serum vitamin D 08/25/2018  . B12 deficiency 11/21/2015  . Acquired hypothyroidism 08/09/2015  . Cardiomyopathy, ischemic 08/09/2015  . Hyperlipidemia, mixed 08/09/2015  . Dizziness 06/13/2012  . CAD (coronary artery disease) 06/13/2012  . H/O irritable bowel syndrome 10/20/2011   Deneise Lever, [place], CCC-SLP Speech-Language Pathologist   Aliene Altes 05/08/2020, 10:34 AM  [place] MAIN [facility] SERVICES [address] [place], [place], [address] Phone: [phone]   Fax:  [phone]   Name: Tracey [name] MRN: [id number] Date of Birth: 09-Jul-1955

## 2020-05-10 ENCOUNTER — Ambulatory Visit: Payer: 59 | Admitting: Speech Pathology

## 2020-05-10 ENCOUNTER — Other Ambulatory Visit: Payer: Self-pay

## 2020-05-10 DIAGNOSIS — I6602 Occlusion and stenosis of left middle cerebral artery: Secondary | ICD-10-CM

## 2020-05-10 DIAGNOSIS — R4701 Aphasia: Secondary | ICD-10-CM | POA: Diagnosis not present

## 2020-05-10 DIAGNOSIS — R482 Apraxia: Secondary | ICD-10-CM

## 2020-05-10 NOTE — Therapy (Signed)
[place] MAIN [facility] SERVICES [address] [place], [place], [address] Phone: [phone]   Fax:  [phone]  Speech Language Pathology Treatment  Patient Details  Name: Tracey [name] MRN: [id number] Date of Birth: May 11, 1955 Referring Provider (SLP): Emily Filbert, MD   Encounter Date: 05/10/2020   End of Session - 05/10/20 1447    Visit Number 3    Number of Visits 25    Date for SLP Re-Evaluation 07/29/20    Authorization Type AET[employer] 60 visits for ST max    Authorization Time Period 04/30/20    Authorization - Visit Number 3    Progress Note Due on Visit 10    SLP Start Time 1000    SLP Stop Time  1100    SLP Time Calculation (min) 60 min    Activity Tolerance Patient tolerated treatment well           Past Medical History:  Diagnosis Date  . Anxiety   . Bronchitis   . CAD (coronary artery disease)    a. 04/2008: cariac arrest with prox LAD occ s/p stenting, arctic sun. b. 11/2008: restenosis s/p repeat stenting. c. 01/2011: abnormal stress test but cath reportedly OK.  Marland Kitchen Dysplastic nevus 11/04/2007   Ant. chest. Slight to moderate atypia, extends to one edge.   Marland Kitchen Dysplastic nevus 01/03/2009   Left breast. Minimal atypia, close to margin.  Marland Kitchen Hypothyroid   . Myocardial infarction [facility]) 2010   stents and cardiac rehab     Past Surgical History:  Procedure Laterality Date  . ANGIOPLASTY  2010   2 stents (femoral)   . BUBBLE STUDY  04/23/2020   Procedure: BUBBLE STUDY;  Surgeon: Geralynn Rile, MD;  Location: [place];  Service: Cardiovascular;;  . IR CT HEAD LTD  04/18/2020  . IR PERCUTANEOUS ART THROMBECTOMY/INFUSION INTRACRANIAL INC DIAG ANGIO  04/18/2020  . LOOP RECORDER INSERTION N/A 04/23/2020   Procedure: LOOP RECORDER INSERTION;  Surgeon: Vickie Epley, MD;  Location: [place] CV LAB;  Service: Cardiovascular;  Laterality: N/A;  . RADIOLOGY WITH ANESTHESIA N/A 04/18/2020   Procedure: IR WITH ANESTHESIA;   Surgeon: Radiologist, Medication, MD;  Location: [place];  Service: Radiology;  Laterality: N/A;  . TEE WITHOUT CARDIOVERSION N/A 04/23/2020   Procedure: TRANSESOPHAGEAL ECHOCARDIOGRAM (TEE);  Surgeon: Geralynn Rile, MD;  Location: [place];  Service: Cardiovascular;  Laterality: N/A;  . TUBAL LIGATION      There were no vitals filed for this visit.   Subjective Assessment - 05/10/20 1424    Subjective Pt helping with Einar Pheasant at church this weekend.    Patient is accompained by: Family member   daughter Tracey [name]   Currently in Pain? No/denies                 ADULT SLP TREATMENT - 05/10/20 1425      General Information   Behavior/Cognition Alert;Cooperative;Pleasant mood    HPI MAKI HEGE is a 65 y.o. female with a PMHx of CAD s/p MI, cardiac arrest, HTN, hypothryoidism, HLD, and anxiety. Presented to [facility] ED with slurred speech and word finding difficulty. CTA head and neck showed a distal M2 occlusion in left MCA branch. Per chart tPA emergently stopped when increased dysarthria noted> emergent head CT showed no intracranial hemorrhage. Pt transferred to [facility] and underwent revascularization of occluded Lt MCA following left MCA embolism. Seen by SLP during acute stay with diagnosis of global aphasia; however verbal output much  improved by d/c on 04/20/20.      Treatment Provided   Treatment provided Cognitive-Linquistic      Cognitive-Linquistic Treatment   Treatment focused on Apraxia;Aphasia;Patient/family/caregiver education    Skilled Treatment Patient reported she is going to help with the cupcake walk at her church; stated she was afraid she wouldn't be able to call numbers as she usually does. Practiced drawing numbers at random and calling out winner and "Come choose your cupcake." Benefitted from written script and cues for slow rate. Generated short scripts for home practice. Pt also benefitted from writing to aid restarts and correct  erroneous productions. Administered portions of the [facility] Diagnostic Aphasia Examination for assessment of reading and writing. Word level spelling errors, more common with irregular forms. Self-formulation (writing) at paragraph level has adequate content, with mild syntax errors and vocabulary omissions. Reading mildly impaired at complex paragraph level.      Assessment / Recommendations / Plan   Plan Continue with current plan of care      Progression Toward Goals   Progression toward goals Progressing toward goals            SLP Education - 05/10/20 1447    Education Details script training    Person(s) Educated Patient;Child(ren)    Methods Explanation;Handout    Comprehension Verbalized understanding;Returned demonstration       [place] Diagnostic Aphasia Examination (excerpts from reading/writing subtests)  Reading Oral reading of sentences with comprehension: 5/5 Reading Comprehension- Sentences and Paragraphs 9/10  Writing Basic Encoding Skills- Dictated words Primer word vocabulary: 6/6 Regular phonics: 4/5 Common irregular forms: 5/5 Uncommon irregularities: 2/6 Nonsense words: 3/6 Oral spelling: 6/6 Written picture naming:  Objects: 3/4 Actions: 4/4 Animals: 4/4 Cognitive/grammatical influences on written word-retrieval Parts of speech: 6/6 Derivational affixes: 6/6 Verb forms: 5/6 Dictated functor-loaded sentences: 6/6 Narrative writing: Mechanics: 3/3, Written vocabulary access: 2/3, Syntax: 2/3, Adequacy of content 3/3        SLP Short Term Goals - 05/01/20 1724      SLP SHORT TERM GOAL #1   Title Patient will complete high-level wordfinding tasks >90% accuracy.    Time 10    Period --   sessions   Status New      SLP SHORT TERM GOAL #2   Title Patient will read multisyllabic words using compensations for apraxia >90% accuracy.    Time 10    Period Weeks    Status New      SLP SHORT TERM GOAL #3   Title Patient will demonstrate  comprehension of paragraphs 8-10 sentences by answering questions >90% accuracy.    Time 10    Period --   sessions   Status New      SLP SHORT TERM GOAL #4   Title Patient will use compensations for aphasia and apraxia in 5-8 minutes mod complex conversation and self-correct breakdowns >90% of the time with modified independence.    Time 10    Period --   sessions   Status New            SLP Long Term Goals - 05/01/20 1728      SLP LONG TERM GOAL #1   Title Patient will report use of strategies for aphasia/apraxia to successfully make phone calls related to health, finances, and/or job functions.    Time 12    Period Weeks    Status New    Target Date 07/29/20      SLP LONG TERM GOAL #2  Title Patient will comprehend work-type emails 100% accuracy with use of compensations if needed (text-to-speech, etc).    Time 12    Period Weeks    Status New    Target Date 07/29/20      SLP LONG TERM GOAL #3   Title Patient will write work-type emails and self-correct >95% of errors with compensations allowed (speech-to-text, text-to-speech, double-checking).    Time 12    Period Weeks    Status New    Target Date 07/29/20      SLP LONG TERM GOAL #4   Title Patient will use compensations for anomia and apraxia functionally in 20+ minutes complex conversation, repairing breakdowns independently.    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 Phonemic paraphasias and apraxic errors persist in conversation; improve with slowing rate. Patient often attempts correction of errors. Reading comprehension impaired at complex paragraph level; writing impaired at sentence level. Pt demonstrates emerging ability/willingness to use compensations. I recommend skilled ST for aphasia and apraxia to maximize verbal and written expression as  well as reading comprehension.    Speech Therapy Frequency 2x / week    Duration 12 weeks    Treatment/Interventions Language facilitation;Environmental controls;Cueing hierarchy;SLP instruction and feedback;Functional tasks;Compensatory strategies;Cognitive reorganization;Internal/external aids;Multimodal communcation approach;Patient/family education    Potential to Achieve Goals Good    Potential Considerations Family/community support    SLP Home Exercise Plan see pt instructions    Consulted and Agree with Plan of Care Patient;Family member/caregiver           Patient will benefit from skilled therapeutic intervention in order to improve the following deficits and impairments:   Aphasia  Verbal apraxia  Middle cerebral artery embolism, left    Problem List Patient Active Problem List   Diagnosis Date Noted  . Acute stroke due to ischemia ([place]) 04/18/2020  . Middle cerebral artery embolism, left 04/18/2020  . Atherosclerosis of native arteries of extremity with intermittent claudication ([place]) 10/05/2019  . GERD (gastroesophageal reflux disease) 09/26/2019  . Low serum vitamin D 08/25/2018  . B12 deficiency 11/21/2015  . Acquired hypothyroidism 08/09/2015  . Cardiomyopathy, ischemic 08/09/2015  . Hyperlipidemia, mixed 08/09/2015  . Dizziness 06/13/2012  . CAD (coronary artery disease) 06/13/2012  . H/O irritable bowel syndrome 10/20/2011   Deneise Lever, [place], CCC-SLP Speech-Language Pathologist  Aliene Altes 05/10/2020, 2:49 PM  [place] MAIN [facility] SERVICES [address] [place], [place], [address] Phone: [phone]   Fax:  [phone]   Name: Tracey [name] MRN: [id number] Date of Birth: Jul 06, 1955

## 2020-05-14 ENCOUNTER — Ambulatory Visit: Payer: 59 | Admitting: Speech Pathology

## 2020-05-14 ENCOUNTER — Other Ambulatory Visit: Payer: Self-pay

## 2020-05-14 DIAGNOSIS — R482 Apraxia: Secondary | ICD-10-CM

## 2020-05-14 DIAGNOSIS — I6602 Occlusion and stenosis of left middle cerebral artery: Secondary | ICD-10-CM

## 2020-05-14 DIAGNOSIS — R4701 Aphasia: Secondary | ICD-10-CM | POA: Diagnosis not present

## 2020-05-15 NOTE — Therapy (Signed)
[place] MAIN [facility] SERVICES [address] [place], [place], [address] Phone: [phone]   Fax:  [phone]  Speech Language Pathology Treatment  Patient Details  Name: Tracey [name] MRN: [id number] Date of Birth: [birth year] Referring Provider (SLP): Emily Filbert, MD   Encounter Date: 05/14/2020   End of Session - 05/15/20 1702    Visit Number 4    Number of Visits 25    Date for SLP Re-Evaluation 07/29/20    Authorization Type AETNA 69 visits for ST max    Authorization Time Period 04/30/20    Authorization - Visit Number 4    Progress Note Due on Visit 10    SLP Start Time 1000    SLP Stop Time  1100    SLP Time Calculation (min) 60 min    Activity Tolerance Patient tolerated treatment well           Past Medical History:  Diagnosis Date  . Anxiety   . Bronchitis   . CAD (coronary artery disease)    a. 04/2008: cariac arrest with prox LAD occ s/p stenting, arctic sun. b. 11/2008: restenosis s/p repeat stenting. c. 01/2011: abnormal stress test but cath reportedly OK.  Marland Kitchen Dysplastic nevus 11/04/2007   Ant. chest. Slight to moderate atypia, extends to one edge.   Marland Kitchen Dysplastic nevus 01/03/2009   Left breast. Minimal atypia, close to margin.  Marland Kitchen Hypothyroid   . Myocardial infarction [facility]) 2010   stents and cardiac rehab     Past Surgical History:  Procedure Laterality Date  . ANGIOPLASTY  2010   2 stents (femoral)   . BUBBLE STUDY  04/23/2020   Procedure: BUBBLE STUDY;  Surgeon: Geralynn Rile, MD;  Location: [place];  Service: Cardiovascular;;  . IR CT HEAD LTD  04/18/2020  . IR PERCUTANEOUS ART THROMBECTOMY/INFUSION INTRACRANIAL INC DIAG ANGIO  04/18/2020  . LOOP RECORDER INSERTION N/A 04/23/2020   Procedure: LOOP RECORDER INSERTION;  Surgeon: Vickie Epley, MD;  Location: [place] [facility];  Service: Cardiovascular;  Laterality: N/A;  . RADIOLOGY WITH ANESTHESIA N/A 04/18/2020   Procedure: IR WITH ANESTHESIA;   Surgeon: Radiologist, Medication, MD;  Location: [place];  Service: Radiology;  Laterality: N/A;  . TEE WITHOUT CARDIOVERSION N/A 04/23/2020   Procedure: TRANSESOPHAGEAL ECHOCARDIOGRAM (TEE);  Surgeon: Geralynn Rile, MD;  Location: [place];  Service: Cardiovascular;  Laterality: N/A;  . TUBAL LIGATION      There were no vitals filed for this visit.   Subjective Assessment - 05/15/20 1648    Subjective Reports she helped call numbers at church event    Patient is accompained by: Family member    Currently in Pain? No/denies                 ADULT SLP TREATMENT - 05/15/20 1648      General Information   Behavior/Cognition Alert;Cooperative;Pleasant mood    HPI Tracey [name] is a 65 y.o. female with a PMHx of CAD s/p MI, cardiac arrest, HTN, hypothryoidism, HLD, and anxiety. Presented to [facility] ED with slurred speech and word finding difficulty. CTA head and neck showed a distal M2 occlusion in left MCA branch. Per chart tPA emergently stopped when increased dysarthria noted> emergent head CT showed no intracranial hemorrhage. Pt transferred to [facility] and underwent revascularization of occluded Lt MCA following left MCA embolism. Seen by SLP during acute stay with diagnosis of global aphasia; however verbal output much improved by d/c on  04/20/20.      Treatment Provided   Treatment provided Cognitive-Linquistic      Cognitive-Linquistic Treatment   Treatment focused on Apraxia;Aphasia;Patient/family/caregiver education    Skilled Treatment Daughter made script cards for pt at the cupcake walk. Pt reports she used them at first and then felt confident to participate in calling numbers without her cards. Mod complex conversation re: weekend events and pt's personal interest (shopping) with rare min cues for slow rate; pt with improved self-monitoring and by end of conversation was slowing rate and correcting her errors without cues. Pt read news article of interest and  used speech-to-text to compose 4 sentence email based on the article. Initial mod cues for use of speech-to-text, and for double checking her response with text-to-speech, fading to rare min A. Pt ID'd and corrected 100% of errors using this method.      Assessment / Recommendations / Plan   Plan Continue with current plan of care      Progression Toward Goals   Progression toward goals Progressing toward goals            SLP Education - 05/15/20 1701    Education Details double check writing with text-to-speech    Person(s) Educated Patient;Child(ren)    Methods Explanation    Comprehension Verbalized understanding            SLP Short Term Goals - 05/01/20 1724      SLP SHORT TERM GOAL #1   Title Patient will complete high-level wordfinding tasks >90% accuracy.    Time 10    Period --   sessions   Status New      SLP SHORT TERM GOAL #2   Title Patient will read multisyllabic words using compensations for apraxia >90% accuracy.    Time 10    Period Weeks    Status New      SLP SHORT TERM GOAL #3   Title Patient will demonstrate comprehension of paragraphs 8-10 sentences by answering questions >90% accuracy.    Time 10    Period --   sessions   Status New      SLP SHORT TERM GOAL #4   Title Patient will use compensations for aphasia and apraxia in 5-8 minutes mod complex conversation and self-correct breakdowns >90% of the time with modified independence.    Time 10    Period --   sessions   Status New            SLP Long Term Goals - 05/01/20 1728      SLP LONG TERM GOAL #1   Title Patient will report use of strategies for aphasia/apraxia to successfully make phone calls related to health, finances, and/or job functions.    Time 12    Period Weeks    Status New    Target Date 07/29/20      SLP LONG TERM GOAL #2   Title Patient will comprehend work-type emails 100% accuracy with use of compensations if needed (text-to-speech, etc).    Time 12    Period  Weeks    Status New    Target Date 07/29/20      SLP LONG TERM GOAL #3   Title Patient will write work-type emails and self-correct >95% of errors with compensations allowed (speech-to-text, text-to-speech, double-checking).    Time 12    Period Weeks    Status New    Target Date 07/29/20      SLP LONG TERM GOAL #4  Title Patient will use compensations for anomia and apraxia functionally in 20+ minutes complex conversation, repairing breakdowns independently.    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 Phonemic paraphasias and apraxic errors persist in conversation; improve with slowing rate. Pt read simple 3 paragraph article with 100% comprehension, wrote email response with some errors but was able to make corrections using accessibility features on her phone. I recommend skilled ST for aphasia and apraxia to maximize verbal and written expression as well as reading comprehension.    Speech Therapy Frequency 2x / week    Duration 12 weeks    Treatment/Interventions Language facilitation;Environmental controls;Cueing hierarchy;SLP instruction and feedback;Functional tasks;Compensatory strategies;Cognitive reorganization;Internal/external aids;Multimodal communcation approach;Patient/family education    Potential to Achieve Goals Good    Potential Considerations Family/community support    SLP Home Exercise Plan see pt instructions    Consulted and Agree with Plan of Care Patient;Family member/caregiver           Patient will benefit from skilled therapeutic intervention in order to improve the following deficits and impairments:   Aphasia  Verbal apraxia  Middle cerebral artery embolism, left    Problem List Patient Active Problem List   Diagnosis Date Noted  . Acute stroke due to ischemia ([place]) 04/18/2020  .  Middle cerebral artery embolism, left 04/18/2020  . Atherosclerosis of native arteries of extremity with intermittent claudication ([place]) 10/05/2019  . GERD (gastroesophageal reflux disease) 09/26/2019  . Low serum vitamin D 08/25/2018  . B12 deficiency 11/21/2015  . Acquired hypothyroidism 08/09/2015  . Cardiomyopathy, ischemic 08/09/2015  . Hyperlipidemia, mixed 08/09/2015  . Dizziness 06/13/2012  . CAD (coronary artery disease) 06/13/2012  . H/O irritable bowel syndrome 10/20/2011   Deneise Lever, [place], CCC-SLP Speech-Language Pathologist  Aliene Altes 05/15/2020, 5:04 PM  [place] MAIN [facility] SERVICES [address] [place], [place], [address] Phone: [phone]   Fax:  [phone]   Name: REVONDA MENTER MRN: [id number] Date of Birth: September 28, 1955

## 2020-05-17 ENCOUNTER — Other Ambulatory Visit: Payer: Self-pay

## 2020-05-17 ENCOUNTER — Ambulatory Visit: Payer: 59 | Admitting: Speech Pathology

## 2020-05-17 DIAGNOSIS — R4701 Aphasia: Secondary | ICD-10-CM | POA: Diagnosis not present

## 2020-05-17 DIAGNOSIS — I6602 Occlusion and stenosis of left middle cerebral artery: Secondary | ICD-10-CM

## 2020-05-17 DIAGNOSIS — R482 Apraxia: Secondary | ICD-10-CM

## 2020-05-17 NOTE — Therapy (Signed)
[place] MAIN [facility] SERVICES [address] [place], [place], [address] Phone: [phone]   Fax:  [phone]  Speech Language Pathology Treatment  Patient Details  Name: DAUNE DIVIRGILIO MRN: [id number] Date of Birth: 1959/03/10 Referring Provider (SLP): Emily Filbert, MD   Encounter Date: 05/17/2020   End of Session - 05/17/20 0959    Visit Number 5    Number of Visits 25    Date for SLP Re-Evaluation 07/29/20    Authorization Type [employer] 60 visits for ST max    Authorization Time Period 04/30/20    Authorization - Visit Number 5    Progress Note Due on Visit 10    SLP Start Time 0850    SLP Stop Time  0950    SLP Time Calculation (min) 60 min    Activity Tolerance Patient tolerated treatment well           Past Medical History:  Diagnosis Date  . Anxiety   . Bronchitis   . CAD (coronary artery disease)    a. 04/2008: cariac arrest with prox LAD occ s/p stenting, arctic sun. b. 11/2008: restenosis s/p repeat stenting. c. 01/2011: abnormal stress test but cath reportedly OK.  Marland Kitchen Dysplastic nevus 11/04/2007   Ant. chest. Slight to moderate atypia, extends to one edge.   Marland Kitchen Dysplastic nevus 01/03/2009   Left breast. Minimal atypia, close to margin.  Marland Kitchen Hypothyroid   . Myocardial infarction [facility]) 2010   stents and cardiac rehab     Past Surgical History:  Procedure Laterality Date  . ANGIOPLASTY  2010   2 stents (femoral)   . BUBBLE STUDY  04/23/2020   Procedure: BUBBLE STUDY;  Surgeon: Geralynn Rile, MD;  Location: [place];  Service: Cardiovascular;;  . IR CT HEAD LTD  04/18/2020  . IR PERCUTANEOUS ART THROMBECTOMY/INFUSION INTRACRANIAL INC DIAG ANGIO  04/18/2020  . LOOP RECORDER INSERTION N/A 04/23/2020   Procedure: LOOP RECORDER INSERTION;  Surgeon: Vickie Epley, MD;  Location: [place] CV [facility];  Service: Cardiovascular;  Laterality: N/A;  . RADIOLOGY WITH ANESTHESIA N/A 04/18/2020   Procedure: IR WITH ANESTHESIA;   Surgeon: Radiologist, Medication, MD;  Location: [place];  Service: Radiology;  Laterality: N/A;  . TEE WITHOUT CARDIOVERSION N/A 04/23/2020   Procedure: TRANSESOPHAGEAL ECHOCARDIOGRAM (TEE);  Surgeon: Geralynn Rile, MD;  Location: [place];  Service: Cardiovascular;  Laterality: N/A;  . TUBAL LIGATION      There were no vitals filed for this visit.   Subjective Assessment - 05/17/20 0958    Subjective "I wrote a Facebook post."    Currently in Pain? No/denies                 ADULT SLP TREATMENT - 05/17/20 0958      General Information   Behavior/Cognition Alert;Cooperative    HPI ELLASYN SWILLING is a 65 y.o. female with a PMHx of CAD s/p MI, cardiac arrest, HTN, hypothryoidism, HLD, and anxiety. Presented to [facility] ED with slurred speech and word finding difficulty. CTA head and neck showed a distal M2 occlusion in left MCA branch. Per chart tPA emergently stopped when increased dysarthria noted> emergent head CT showed no intracranial hemorrhage. Pt transferred to [facility] and underwent revascularization of occluded Lt MCA following left MCA embolism. Seen by SLP during acute stay with diagnosis of global aphasia; however verbal output much improved by d/c on 04/20/20.      Treatment Provided   Treatment provided Cognitive-Linquistic  Pain Assessment   Pain Assessment No/denies pain      Cognitive-Linquistic Treatment   Treatment focused on Apraxia;Aphasia;Patient/family/caregiver education    Skilled Treatment Patient reported using accessibility features on her phone to compose a Facebook post to her friends. She wrote a paragraph thanking them for their support since her stroke. No errors, although she worked on this for about 45 minutes, she said. In mod complex conversation, pt slowing her rate and making corrections 90% of the time. 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 With auditory feedback, pt  able to ID/correct ~70% of errors. SLP assisted by providing mod cues for syntax errors. Verbalized steps of accesing the website with extended time, rare min A.      Assessment / Recommendations / Plan   Plan Continue with current plan of care      Progression Toward Goals   Progression toward goals Progressing toward goals            SLP Education - 05/17/20 0959    Education Details aphasia blog    Person(s) Educated Patient    Methods Explanation    Comprehension Verbalized understanding            SLP Short Term Goals - 05/01/20 1724      SLP SHORT TERM GOAL #1   Title Patient will complete high-level wordfinding tasks >90% accuracy.    Time 10    Period --   sessions   Status New      SLP SHORT TERM GOAL #2   Title Patient will read multisyllabic words using compensations for apraxia >90% accuracy.    Time 10    Period Weeks    Status New      SLP SHORT TERM GOAL #3   Title Patient will demonstrate comprehension of paragraphs 8-10 sentences by answering questions >90% accuracy.    Time 10    Period --   sessions   Status New      SLP SHORT TERM GOAL #4   Title Patient will use compensations for aphasia and apraxia in 5-8 minutes mod complex conversation and self-correct breakdowns >90% of the time with modified independence.    Time 10    Period --   sessions   Status New            SLP Long Term Goals - 05/01/20 1728      SLP LONG TERM GOAL #1   Title Patient will report use of strategies for aphasia/apraxia to successfully make phone calls related to health, finances, and/or job functions.    Time 12    Period Weeks    Status New    Target Date 07/29/20      SLP LONG TERM GOAL #2   Title Patient will comprehend work-type emails 100% accuracy with use of compensations if needed (text-to-speech, etc).    Time 12    Period Weeks    Status New    Target Date 07/29/20      SLP LONG TERM GOAL #3   Title Patient will write work-type emails and  self-correct >95% of errors with compensations allowed (speech-to-text, text-to-speech, double-checking).    Time 12    Period Weeks    Status New    Target Date 07/29/20      SLP LONG TERM GOAL #4   Title Patient will use compensations for anomia and apraxia functionally in 20+ minutes complex conversation, repairing breakdowns independently.  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 Phonemic paraphasias and apraxic errors persist in conversation; improve with slowing rate; pt self-corrected today 90% of the time. Extended time required to write 8 sentence description of work-related task, error awareness ~75%. I recommend skilled ST for aphasia and apraxia to maximize verbal and written expression as well as reading comprehension.    Speech Therapy Frequency 2x / week    Duration 12 weeks    Treatment/Interventions Language facilitation;Environmental controls;Cueing hierarchy;SLP instruction and feedback;Functional tasks;Compensatory strategies;Cognitive reorganization;Internal/external aids;Multimodal communcation approach;Patient/family education    Potential to Achieve Goals Good    Potential Considerations Family/community support    SLP Home Exercise Plan see pt instructions    Consulted and Agree with Plan of Care Patient;Family member/caregiver           Patient will benefit from skilled therapeutic intervention in order to improve the following deficits and impairments:   Aphasia  Verbal apraxia  Middle cerebral artery embolism, left    Problem List Patient Active Problem List   Diagnosis Date Noted  . Acute stroke due to ischemia ([place]) 04/18/2020  . Middle cerebral artery embolism, left 04/18/2020  . Atherosclerosis of native arteries of extremity with intermittent claudication ([place]) 10/05/2019  . GERD  (gastroesophageal reflux disease) 09/26/2019  . Low serum vitamin D 08/25/2018  . B12 deficiency 11/21/2015  . Acquired hypothyroidism 08/09/2015  . Cardiomyopathy, ischemic 08/09/2015  . Hyperlipidemia, mixed 08/09/2015  . Dizziness 06/13/2012  . CAD (coronary artery disease) 06/13/2012  . H/O irritable bowel syndrome 10/20/2011   Deneise Lever, [place], CCC-SLP Speech-Language Pathologist  Aliene Altes 05/17/2020, 10:01 AM  [place] MAIN [facility] SERVICES [address] [place], [place], [address] Phone: [phone]   Fax:  [phone]   Name: PETRICE BEEDY MRN: [id number] Date of Birth: [birth year]

## 2020-05-21 ENCOUNTER — Ambulatory Visit: Payer: 59 | Admitting: Speech Pathology

## 2020-05-21 ENCOUNTER — Other Ambulatory Visit: Payer: Self-pay

## 2020-05-21 DIAGNOSIS — I6602 Occlusion and stenosis of left middle cerebral artery: Secondary | ICD-10-CM

## 2020-05-21 DIAGNOSIS — R4701 Aphasia: Secondary | ICD-10-CM

## 2020-05-21 DIAGNOSIS — R482 Apraxia: Secondary | ICD-10-CM

## 2020-05-21 NOTE — Therapy (Signed)
[place] MAIN [facility] SERVICES [address] [place], [place], [address] Phone: [phone]   Fax:  [phone]  Speech Language Pathology Treatment  Patient Details  Name: Tracey [name] MRN: [id number] Date of Birth: September 14, 1955 Referring Provider (SLP): Emily Filbert, MD   Encounter Date: 05/21/2020   End of Session - 05/21/20 1539    Visit Number 6    Number of Visits 25    Date for SLP Re-Evaluation 07/29/20    Authorization Time Period 04/30/20    Authorization - Visit Number 6    Progress Note Due on Visit 10    SLP Start Time 1000    SLP Stop Time  1108    SLP Time Calculation (min) 68 min    Activity Tolerance Patient tolerated treatment well           Past Medical History:  Diagnosis Date  . Anxiety   . Bronchitis   . CAD (coronary artery disease)    a. 04/2008: cariac arrest with prox LAD occ s/p stenting, arctic sun. b. 11/2008: restenosis s/p repeat stenting. c. 01/2011: abnormal stress test but cath reportedly OK.  Marland Kitchen Dysplastic nevus 11/04/2007   Ant. chest. Slight to moderate atypia, extends to one edge.   Marland Kitchen Dysplastic nevus 01/03/2009   Left breast. Minimal atypia, close to margin.  Marland Kitchen Hypothyroid   . Myocardial infarction [facility]) 2010   stents and cardiac rehab     Past Surgical History:  Procedure Laterality Date  . ANGIOPLASTY  2010   2 stents (femoral)   . BUBBLE STUDY  04/23/2020   Procedure: BUBBLE STUDY;  Surgeon: Geralynn Rile, MD;  Location: [place];  Service: Cardiovascular;;  . IR CT HEAD LTD  04/18/2020  . IR PERCUTANEOUS ART THROMBECTOMY/INFUSION INTRACRANIAL INC DIAG ANGIO  04/18/2020  . LOOP RECORDER INSERTION N/A 04/23/2020   Procedure: LOOP RECORDER INSERTION;  Surgeon: Vickie Epley, MD;  Location: [place] CV LAB;  Service: Cardiovascular;  Laterality: N/A;  . RADIOLOGY WITH ANESTHESIA N/A 04/18/2020   Procedure: IR WITH ANESTHESIA;  Surgeon: Radiologist, Medication, MD;  Location:  [place];  Service: Radiology;  Laterality: N/A;  . TEE WITHOUT CARDIOVERSION N/A 04/23/2020   Procedure: TRANSESOPHAGEAL ECHOCARDIOGRAM (TEE);  Surgeon: Geralynn Rile, MD;  Location: [place];  Service: Cardiovascular;  Laterality: N/A;  . TUBAL LIGATION      There were no vitals filed for this visit.   Subjective Assessment - 05/21/20 1533    Subjective Reports she was tired at Bible Study last night    Currently in Pain? No/denies                 ADULT SLP TREATMENT - 05/21/20 1533      General Information   Behavior/Cognition Alert;Cooperative    HPI Tracey [name] is a 65 y.o. female with a PMHx of CAD s/p MI, cardiac arrest, HTN, hypothryoidism, HLD, and anxiety. Presented to [facility] ED with slurred speech and word finding difficulty. CTA head and neck showed a distal M2 occlusion in left MCA branch. Per chart tPA emergently stopped when increased dysarthria noted> emergent head CT showed no intracranial hemorrhage. Pt transferred to [facility] and underwent revascularization of occluded Lt MCA following left MCA embolism. Seen by SLP during acute stay with diagnosis of global aphasia; however verbal output much improved by d/c on 04/20/20.      Treatment Provided   Treatment provided Cognitive-Linquistic      Pain Assessment  Pain Assessment No/denies pain      Cognitive-Linquistic Treatment   Treatment focused on Apraxia;Aphasia;Patient/family/caregiver education    Skilled Treatment Had difficulty with high level verb wordfinding tasks at home; requested assistance. Reviewed with pt that abstract images may not always be clear; goal is for pt to generate an appropriate verb (an alternative is acceptable). Pt completed high-level verb and adjective tasks with 90% accuracy, occasional min cues (semantic). Continues to report frustration with leaving out "little words," when texting her boss or kids. She is able to find errors using text-to-speech. Worked with pt  today on generating sentences using VNEST; pt required occasional min cues and generated 3 mod complex sentences, self-correcting 2/2 errors. Pt generated sentences given 2 words using strategy of verbalization to reduce errors/increase error awareness, 90% accuracy. Education on using compensations/energy conservation to improve participation in [place] study. Pt was fatigued for group discussion after dinner out at a restaurant. Pt generated solution of sitting at the end of the table instead of the center so that she can focus on a single conversation. SLP demonstrated using written keywords as a visual aid during group discussion. Occasional min cues to slow rate with apraxic errors in session today.      Assessment / Recommendations / Plan   Plan Continue with current plan of care      Progression Toward Goals   Progression toward goals Progressing toward goals            SLP Education - 05/21/20 1539    Education Details written keywords to aid conversation    Person(s) Educated Patient    Methods Explanation;Demonstration    Comprehension Verbalized understanding;Need further instruction            SLP Short Term Goals - 05/01/20 1724      SLP SHORT TERM GOAL #1   Title Patient will complete high-level wordfinding tasks >90% accuracy.    Time 10    Period --   sessions   Status New      SLP SHORT TERM GOAL #2   Title Patient will read multisyllabic words using compensations for apraxia >90% accuracy.    Time 10    Period Weeks    Status New      SLP SHORT TERM GOAL #3   Title Patient will demonstrate comprehension of paragraphs 8-10 sentences by answering questions >90% accuracy.    Time 10    Period --   sessions   Status New      SLP SHORT TERM GOAL #4   Title Patient will use compensations for aphasia and apraxia in 5-8 minutes mod complex conversation and self-correct breakdowns >90% of the time with modified independence.    Time 10    Period --   sessions    Status New            SLP Long Term Goals - 05/01/20 1728      SLP LONG TERM GOAL #1   Title Patient will report use of strategies for aphasia/apraxia to successfully make phone calls related to health, finances, and/or job functions.    Time 12    Period Weeks    Status New    Target Date 07/29/20      SLP LONG TERM GOAL #2   Title Patient will comprehend work-type emails 100% accuracy with use of compensations if needed (text-to-speech, etc).    Time 12    Period Weeks    Status New    Target  Date 07/29/20      SLP LONG TERM GOAL #3   Title Patient will write work-type emails and self-correct >95% of errors with compensations allowed (speech-to-text, text-to-speech, double-checking).    Time 12    Period Weeks    Status New    Target Date 07/29/20      SLP LONG TERM GOAL #4   Title Patient will use compensations for anomia and apraxia functionally in 20+ minutes complex conversation, repairing breakdowns independently.    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 Phonemic paraphasias and apraxic errors persist in conversation; improve with slowing rate; pt required occasional cues for self-correcting today. Benefits from using verbalization/re-reading aloud to locate errors in sentence level writing tasks. I recommend skilled ST for aphasia and apraxia to maximize verbal and written expression as well as reading comprehension.    Speech Therapy Frequency 2x / week    Duration 12 weeks    Treatment/Interventions Language facilitation;Environmental controls;Cueing hierarchy;SLP instruction and feedback;Functional tasks;Compensatory strategies;Cognitive reorganization;Internal/external aids;Multimodal communcation approach;Patient/family education    Potential to Achieve Goals Good    Potential Considerations  Family/community support    SLP Home Exercise Plan see pt instructions    Consulted and Agree with Plan of Care Patient;Family member/caregiver           Patient will benefit from skilled therapeutic intervention in order to improve the following deficits and impairments:   Aphasia  Verbal apraxia  Middle cerebral artery embolism, left    Problem List Patient Active Problem List   Diagnosis Date Noted  . Acute stroke due to ischemia ([place]) 04/18/2020  . Middle cerebral artery embolism, left 04/18/2020  . Atherosclerosis of native arteries of extremity with intermittent claudication ([place]) 10/05/2019  . GERD (gastroesophageal reflux disease) 09/26/2019  . Low serum vitamin D 08/25/2018  . B12 deficiency 11/21/2015  . Acquired hypothyroidism 08/09/2015  . Cardiomyopathy, ischemic 08/09/2015  . Hyperlipidemia, mixed 08/09/2015  . Dizziness 06/13/2012  . CAD (coronary artery disease) 06/13/2012  . H/O irritable bowel syndrome 10/20/2011   Deneise Lever, [place], CCC-SLP Speech-Language Pathologist   Tracey [name] 05/21/2020, 3:41 PM  [place] MAIN [facility] SERVICES [address] [place], [place], [address] Phone: [phone]   Fax:  [phone]   Name: Tracey [name] MRN: [id number] Date of Birth: Feb 19, 1955

## 2020-05-22 ENCOUNTER — Encounter: Payer: Self-pay | Admitting: Cardiovascular Disease

## 2020-05-22 ENCOUNTER — Ambulatory Visit (INDEPENDENT_AMBULATORY_CARE_PROVIDER_SITE_OTHER): Payer: 59 | Admitting: Cardiovascular Disease

## 2020-05-22 ENCOUNTER — Other Ambulatory Visit: Payer: Self-pay

## 2020-05-22 VITALS — BP 122/68 | HR 78 | Ht 65.0 in | Wt 209.0 lb

## 2020-05-22 DIAGNOSIS — I70213 Atherosclerosis of native arteries of extremities with intermittent claudication, bilateral legs: Secondary | ICD-10-CM

## 2020-05-22 DIAGNOSIS — I25118 Atherosclerotic heart disease of native coronary artery with other forms of angina pectoris: Secondary | ICD-10-CM | POA: Diagnosis not present

## 2020-05-22 MED ORDER — NITROGLYCERIN 0.4 MG SL SUBL: 0.4000 mg | SUBLINGUAL_TABLET | SUBLINGUAL | 3 refills | Status: DC | PRN

## 2020-05-22 NOTE — Progress Notes (Signed)
Cardiology Office Note:    Date:  05/22/2020   ID:  [name], DOB August 08, 1955, MRN [id number]  PCP:  Rusty Aus, MD   [place]  Cardiologist:  Anastacio Bua  Advanced Practice Provider:  No care team member to display Electrophysiologist:  Quentin Ore    :[id number]}   Referring MD: Rusty Aus, MD   Chief Complaint  Patient presents with  . Cerebrovascular Accident    May 22, 2020   Tracey [name] is a 65 y.o. female with a hx of CVA, CAD ( s/p anterior MI with stent placement in 2010) Was a smoker at the time ,   She was admitted in March with a CVA. 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 Quentin Ore She has moderate ( grade III) layered plaque in the transverse and descending aorta  She had mildly reduced LV function with apical akinesis.  Still speech is altered.  Going to speech therapy,  Some difficult with reading and writing . Still has some headaches   She is here to transfer from her cardiologist  at [facility]  No CP , no dyspnea She has some continued discomfort  from her implantable loop recorder   Has been found to have PAD    Past Medical History:  Diagnosis Date  . Anxiety   . Bronchitis   . CAD (coronary artery disease)    a. 04/2008: cariac arrest with prox LAD occ s/p stenting, arctic sun. b. 11/2008: restenosis s/p repeat stenting. c. 01/2011: abnormal stress test but cath reportedly OK.  Marland Kitchen Dysplastic nevus 11/04/2007   Ant. chest. Slight to moderate atypia, extends to one edge.   Marland Kitchen Dysplastic nevus 01/03/2009   Left breast. Minimal atypia, close to margin.  Marland Kitchen Hypothyroid   . Myocardial infarction [facility]) 2010   stents and cardiac rehab     Past Surgical History:  Procedure Laterality Date  . ANGIOPLASTY  2010   2 stents (femoral)   . BUBBLE STUDY  04/23/2020   Procedure: BUBBLE STUDY;  Surgeon: Geralynn Rile, MD;  Location: [place];   Service: Cardiovascular;;  . IR CT HEAD LTD  04/18/2020  . IR PERCUTANEOUS ART THROMBECTOMY/INFUSION INTRACRANIAL INC DIAG ANGIO  04/18/2020  . LOOP RECORDER INSERTION N/A 04/23/2020   Procedure: LOOP RECORDER INSERTION;  Surgeon: Vickie Epley, MD;  Location: [place] CV LAB;  Service: Cardiovascular;  Laterality: N/A;  . RADIOLOGY WITH ANESTHESIA N/A 04/18/2020   Procedure: IR WITH ANESTHESIA;  Surgeon: Radiologist, Medication, MD;  Location: [place];  Service: Radiology;  Laterality: N/A;  . TEE WITHOUT CARDIOVERSION N/A 04/23/2020   Procedure: TRANSESOPHAGEAL ECHOCARDIOGRAM (TEE);  Surgeon: Geralynn Rile, MD;  Location: [place];  Service: Cardiovascular;  Laterality: N/A;  . TUBAL LIGATION      Current Medications: Current Meds  Medication Sig  . ALPRAZolam (XANAX) 0.25 MG tablet Take 0.25 mg by mouth daily as needed.  . Cholecalciferol 50 MCG (2000 UT) TABS Take 2,000 Units by mouth daily.  . citalopram (CELEXA) 20 MG tablet Take 20 mg by mouth daily.  . clopidogrel (PLAVIX) 75 MG tablet Take 1 tablet (75 mg total) by mouth daily.  . cyanocobalamin (,VITAMIN B-12,) 1000 MCG/ML injection Inject 1,000 mcg into the muscle every 30 (thirty) days.  Marland Kitchen escitalopram (LEXAPRO) 10 MG tablet Take by mouth.  . furosemide (LASIX) 20 MG tablet Take 20 mg by mouth daily as  needed.  Marland Kitchen levothyroxine (SYNTHROID) 75 MCG tablet Take 75 mcg by mouth daily.  . metoprolol succinate (TOPROL-XL) 25 MG 24 hr tablet Take 25 mg by mouth daily.  . pantoprazole (PROTONIX) 40 MG tablet Take 40 mg by mouth daily.  . ramipril (ALTACE) 5 MG capsule Take 5 mg by mouth daily.  . rosuvastatin (CRESTOR) 40 MG tablet Take 40 mg by mouth daily.  . [DISCONTINUED] doxycycline (VIBRAMYCIN) 100 MG capsule Take 1 capsule (100 mg total) by mouth 2 (two) times daily.  . [DISCONTINUED] nitroGLYCERIN (NITROSTAT) 0.4 MG SL tablet Place 0.4 mg under the tongue every 5 (five) minutes as needed for chest pain.      Allergies:   Sulfa antibiotics   Social History   Socioeconomic History  . Marital status: Married    Spouse name: Not on file  . Number of children: Not on file  . Years of education: Not on file  . Highest education level: Not on file  Occupational History  . Not on file  Tobacco Use  . Smoking status: Former Smoker    Packs/day: 1.50    Types: Cigarettes    Start date: 03/22/1973    Quit date: 04/27/2008    Years since quitting: 12.0  . Smokeless tobacco: Never Used  Vaping Use  . Vaping Use: Never used  Substance and Sexual Activity  . Alcohol use: No    Alcohol/week: 0.0 standard drinks  . Drug use: Never  . Sexual activity: Yes    Birth control/protection: Post-menopausal  Other Topics Concern  . Not on file  Social History Narrative  . Not on file   Social Determinants of Health   Financial Resource Strain: Not on file  Food Insecurity: Not on file  Transportation Needs: Not on file  Physical Activity: Not on file  Stress: Not on file  Social Connections: Not on file     Family History: The patient's family history includes Diabetes in her maternal grandfather and mother; Hypertension in her mother; Stroke in her maternal grandfather and maternal grandmother.  ROS:   Please see the history of present illness.     All other systems reviewed and are negative.  EKGs/Labs/Other Studies Reviewed:    The following studies were reviewed today:   EKG:  pril 26, 2022:  NSR at 78.  No ST or T wave changes.   Old ant. Septal Mi   Recent Labs: 04/19/2020: TSH 0.425 04/24/2020: ALT 16; BUN 7; Creatinine, Ser 0.77; Hemoglobin 12.6; Platelets 191; Potassium 3.9; Sodium 138  Recent Lipid Panel    Component Value Date/Time   CHOL 135 04/19/2020 0500   TRIG 55 04/19/2020 0500   HDL 73 04/19/2020 0500   CHOLHDL 1.8 04/19/2020 0500   VLDL 11 04/19/2020 0500   LDLCALC 51 04/19/2020 0500     Risk Assessment/Calculations:       Physical Exam:    VS:  BP  122/68   Pulse 78   Ht 5\' 5"  (1.651 m)   Wt 209 lb (94.8 kg)   SpO2 98%   BMI 34.78 kg/m     Wt Readings from Last 3 Encounters:  05/22/20 209 lb (94.8 kg)  04/23/20 210 lb (95.3 kg)  04/18/20 217 lb 1.6 oz (98.5 kg)     GEN: Middle-aged, moderately obese female, no acute distress HEENT: Normal NECK: No JVD; No carotid bruits LYMPHATICS: No lymphadenopathy CARDIAC: RRR, no murmurs, rubs, gallops.  Her implantable loop recorder is well-healed.  It is still  slightly tender. RESPIRATORY:  Clear to auscultation without rales, wheezing or rhonchi  ABDOMEN: Soft, non-tender, non-distended MUSCULOSKELETAL:  No edema; No deformity  SKIN: Warm and dry NEUROLOGIC:  Alert and oriented x 3 PSYCHIATRIC:  Normal affect   ASSESSMENT:    1. Coronary artery disease of native artery of native heart with stable angina pectoris ([place])    PLAN:    In order of problems listed above:  1. Coronary artery disease: Tracey [name] has a history of anterior septal myocardial infarction in 2010.  She had a history of smoking at that time.  In addition she has some hyperlipidemia.  Her lipids are now well controlled.  She is no longer smoking.  She is not having any episodes of angina.  We will refill her nitroglycerin.  Otherwise she remains very stable.   2.  History of CVA: She had a CVA recently.  She is had an implantable loop recorder placed.  Transesophageal echo revealed a fenestrated atrial septum with a very small right to left shunting via bubble study.  She does not have any evidence of DVT.  3.  Hyperlipidemia: Continue current medications.        Medication Adjustments/Labs and Tests Ordered: Current medicines are reviewed at length with the patient today.  Concerns regarding medicines are outlined above.  Orders Placed This Encounter  Procedures  . EKG 12-Lead   Meds ordered this encounter  Medications  . nitroGLYCERIN (NITROSTAT) 0.4 MG SL tablet    Sig: Place 1 tablet (0.4 mg total)  under the tongue every 5 (five) minutes as needed for chest pain.    Dispense:  25 tablet    Refill:  3    Patient Instructions  Medication Instructions:  Your physician recommends that you continue on your current medications as directed. Please refer to the Current Medication list given to you today.  *If you need a refill on your cardiac medications before your next appointment, please call your pharmacy*   Lab Work: none If you have labs (blood work) drawn today and your tests are completely normal, you will receive your results only by: Marland Kitchen MyChart Message (if you have MyChart) OR . A paper copy in the mail If you have any lab test that is abnormal or we need to change your treatment, we will call you to review the results.   Testing/Procedures: none   Follow-Up: At [facility], you and your health needs are our priority.  As part of our continuing mission to provide you with exceptional heart care, we have created designated Provider Care Teams.  These Care Teams include your primary Cardiologist (physician) and Advanced Practice Providers (APPs -  Physician Assistants and Nurse Practitioners) who all work together to provide you with the care you need, when you need it.   Your next appointment:   1 year(s)  The format for your next appointment:   In Person  Provider:   You may see Dr. Acie Fredrickson or one of the following Advanced Practice Providers on your designated Care Team:    Richardson Dopp, PA-C  Robbie Lis, [place]      Signed, Mertie Moores, MD  05/22/2020 3:36 PM    [place]

## 2020-05-22 NOTE — Patient Instructions (Signed)
Medication Instructions:  Your physician recommends that you continue on your current medications as directed. Please refer to the Current Medication list given to you today.  *If you need a refill on your cardiac medications before your next appointment, please call your pharmacy*   Lab Work: none If you have labs (blood work) drawn today and your tests are completely normal, you will receive your results only by: . MyChart Message (if you have MyChart) OR . A paper copy in the mail If you have any lab test that is abnormal or we need to change your treatment, we will call you to review the results.   Testing/Procedures: none   Follow-Up: At [facility], you and your health needs are our priority.  As part of our continuing mission to provide you with exceptional heart care, we have created designated Provider Care Teams.  These Care Teams include your primary Cardiologist (physician) and Advanced Practice Providers (APPs -  Physician Assistants and Nurse Practitioners) who all work together to provide you with the care you need, when you need it.   Your next appointment:   1 year(s)  The format for your next appointment:   In Person  Provider:   You may see Dr. Nahser or one of the following Advanced Practice Providers on your designated Care Team:    Scott Weaver, PA-C  Vin Bhagat, PA-C    

## 2020-05-24 ENCOUNTER — Ambulatory Visit: Payer: 59 | Admitting: Speech Pathology

## 2020-05-24 ENCOUNTER — Other Ambulatory Visit: Payer: Self-pay

## 2020-05-24 DIAGNOSIS — R4701 Aphasia: Secondary | ICD-10-CM | POA: Diagnosis not present

## 2020-05-24 DIAGNOSIS — I6602 Occlusion and stenosis of left middle cerebral artery: Secondary | ICD-10-CM

## 2020-05-24 DIAGNOSIS — R482 Apraxia: Secondary | ICD-10-CM

## 2020-05-24 NOTE — Therapy (Signed)
[place] MAIN [facility] SERVICES [address] [place], [place], [address] Phone: [phone]   Fax:  [phone]  Speech Language Pathology Treatment  Patient Details  Name: Tracey [name] MRN: [id number] Date of Birth: 12/27/1955 Referring Provider (SLP): Emily Filbert, MD   Encounter Date: 05/24/2020   End of Session - 05/24/20 1453    Visit Number 7    Number of Visits 25    Date for SLP Re-Evaluation 07/29/20    Authorization Type [employer]ET[employer] 60 visits for ST max    Authorization Time Period 04/30/20    Authorization - Visit Number 7    Progress Note Due on Visit 10    SLP Start Time 0900    SLP Stop Time  1000    SLP Time Calculation (min) 60 min    Activity Tolerance Patient tolerated treatment well           Past Medical History:  Diagnosis Date  . Anxiety   . Bronchitis   . CAD (coronary artery disease)    a. 04/2008: cariac arrest with prox LAD occ s/p stenting, arctic sun. b. 11/2008: restenosis s/p repeat stenting. c. 01/2011: abnormal stress test but cath reportedly OK.  Marland Kitchen Dysplastic nevus 11/04/2007   Ant. chest. Slight to moderate atypia, extends to one edge.   Marland Kitchen Dysplastic nevus 01/03/2009   Left breast. Minimal atypia, close to margin.  Marland Kitchen Hypothyroid   . Myocardial infarction [facility]) 2010   stents and cardiac rehab     Past Surgical History:  Procedure Laterality Date  . ANGIOPLASTY  2010   2 stents (femoral)   . BUBBLE STUDY  04/23/2020   Procedure: BUBBLE STUDY;  Surgeon: Geralynn Rile, MD;  Location: [place];  Service: Cardiovascular;;  . IR CT HEAD LTD  04/18/2020  . IR PERCUTANEOUS ART THROMBECTOMY/INFUSION INTRACRANIAL INC DIAG ANGIO  04/18/2020  . LOOP RECORDER INSERTION N/A 04/23/2020   Procedure: LOOP RECORDER INSERTION;  Surgeon: Vickie Epley, MD;  Location: [place] CV LAB;  Service: Cardiovascular;  Laterality: N/A;  . RADIOLOGY WITH ANESTHESIA N/A 04/18/2020   Procedure: IR WITH ANESTHESIA;   Surgeon: Radiologist, Medication, MD;  Location: [place];  Service: Radiology;  Laterality: N/A;  . TEE WITHOUT CARDIOVERSION N/A 04/23/2020   Procedure: TRANSESOPHAGEAL ECHOCARDIOGRAM (TEE);  Surgeon: Geralynn Rile, MD;  Location: [place];  Service: Cardiovascular;  Laterality: N/A;  . TUBAL LIGATION      There were no vitals filed for this visit.   Subjective Assessment - 05/24/20 1441    Subjective Had difficulty with mental addition while helping at [employer] last night.    Currently in Pain? No/denies                 ADULT SLP TREATMENT - 05/24/20 1442      General Information   Behavior/Cognition Alert;Cooperative    HPI Tracey [name] is a 65 y.o. female with a PMHx of CAD s/p MI, cardiac arrest, HTN, hypothryoidism, HLD, and anxiety. Presented to [facility] ED with slurred speech and word finding difficulty. CTA head and neck showed a distal M2 occlusion in left MCA branch. Per chart tPA emergently stopped when increased dysarthria noted> emergent head CT showed no intracranial hemorrhage. Pt transferred to [facility] and underwent revascularization of occluded Lt MCA following left MCA embolism. Seen by SLP during acute stay with diagnosis of global aphasia; however verbal output much improved by d/c on 04/20/20.      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 We discussed that in her case this is likely rooted in her language impairment vs problem-solving. Cued pt to complete similar task with slow rate, tapping each number and allowing time to say it out loud, and she was able to add 10 numbers (multiples of 10) 100% accuracy. We discussed how  her language impairment is likely to impact speed and accuracy if she returns to work; while patient may be capable of completing tasks she could complete previously, she will require use of compensations as well as extended time, and should consider modified/reduced workload and gradual return. Educated pt on importance of self-advocating as her disability may not be immediately visible to others but can have a major impact on the way she communicates, particularly under stress or time pressure. Pt read mod complex article re: agrammatism with occasional mod cues for slow rate to correct apraxic errors. Pt demo'd  comprehension, remarking on similarities she has noticed with her own speech and writing abilities. For homework pt is to write reflection on how agrammatism may affect her at work.      Assessment / Recommendations / Plan   Plan Continue with current plan of care      Progression Toward Goals   Progression toward goals Progressing toward goals            SLP Education - 05/24/20 1453    Education Details agrammatism    Person(s) Educated Patient    Methods Explanation    Comprehension Verbalized understanding            SLP Short Term Goals - 05/01/20 1724      SLP SHORT TERM GOAL #1   Title Patient will complete high-level wordfinding tasks >90% accuracy.    Time 10    Period --   sessions   Status New      SLP SHORT TERM GOAL #2   Title Patient will read multisyllabic words using compensations for apraxia >90% accuracy.    Time 10    Period Weeks    Status New      SLP SHORT TERM GOAL #3   Title Patient will demonstrate comprehension of paragraphs 8-10 sentences by answering questions >90% accuracy.    Time 10    Period --   sessions   Status New      SLP SHORT TERM GOAL #4   Title Patient will use compensations for aphasia and apraxia in 5-8 minutes mod complex conversation and self-correct breakdowns >90% of the time with modified independence.    Time 10     Period --   sessions   Status New            SLP Long Term Goals - 05/01/20 1728      SLP LONG TERM GOAL #1   Title Patient will report use of strategies for aphasia/apraxia to successfully make phone calls related to health, finances, and/or job functions.    Time 12    Period Weeks    Status New    Target Date 07/29/20      SLP LONG TERM GOAL #2   Title Patient will comprehend work-type emails 100% accuracy  with use of compensations if needed (text-to-speech, etc).    Time 12    Period Weeks    Status New    Target Date 07/29/20      SLP LONG TERM GOAL #3   Title Patient will write work-type emails and self-correct >95% of errors with compensations allowed (speech-to-text, text-to-speech, double-checking).    Time 12    Period Weeks    Status New    Target Date 07/29/20      SLP LONG TERM GOAL #4   Title Patient will use compensations for anomia and apraxia functionally in 20+ minutes complex conversation, repairing breakdowns independently.    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 Expressive deficits impact her ability to communicate verbally and more significantly in writing, limiting her ability to perform duties relating to work and community activities. Patient is receptive to use of compensations which increases her ability to participate in premorbid activities. Skilled ST for aphasia and apraxia remains necessary to maximize verbal and written expression as well as reading comprehension for return to PLOF and work/community activities.    Speech Therapy Frequency 2x / week    Duration 12 weeks    Treatment/Interventions Language facilitation;Environmental controls;Cueing hierarchy;SLP instruction and feedback;Functional tasks;Compensatory strategies;Cognitive reorganization;Internal/external  aids;Multimodal communcation approach;Patient/family education    Potential to Achieve Goals Good    Potential Considerations Family/community support    SLP Home Exercise Plan see pt instructions    Consulted and Agree with Plan of Care Patient;Family member/caregiver           Patient will benefit from skilled therapeutic intervention in order to improve the following deficits and impairments:   Aphasia  Verbal apraxia  Middle cerebral artery embolism, left    Problem List Patient Active Problem List   Diagnosis Date Noted  . Acute stroke due to ischemia ([place]) 04/18/2020  . Middle cerebral artery embolism, left 04/18/2020  . Atherosclerosis of native arteries of extremity with intermittent claudication ([place]) 10/05/2019  . GERD (gastroesophageal reflux disease) 09/26/2019  . Low serum vitamin D 08/25/2018  . B12 deficiency 11/21/2015  . Acquired hypothyroidism 08/09/2015  . Cardiomyopathy, ischemic 08/09/2015  . Hyperlipidemia, mixed 08/09/2015  . Dizziness 06/13/2012  . CAD (coronary artery disease) 06/13/2012  . H/O irritable bowel syndrome 10/20/2011   Deneise Lever, [place], CCC-SLP Speech-Language Pathologist  Aliene Altes 05/24/2020, 2:58 PM  [place] MAIN [facility] SERVICES [address] [place], [place], [address] Phone: [phone]   Fax:  [phone]   Name: Tracey [name] MRN: [id number] Date of Birth: 06-18-1955

## 2020-05-28 ENCOUNTER — Other Ambulatory Visit: Payer: Self-pay

## 2020-05-28 ENCOUNTER — Ambulatory Visit: Payer: 59 | Attending: Internal Medicine | Admitting: Speech Pathology

## 2020-05-28 ENCOUNTER — Ambulatory Visit (INDEPENDENT_AMBULATORY_CARE_PROVIDER_SITE_OTHER): Payer: 59

## 2020-05-28 DIAGNOSIS — I6602 Occlusion and stenosis of left middle cerebral artery: Secondary | ICD-10-CM | POA: Insufficient documentation

## 2020-05-28 DIAGNOSIS — R4701 Aphasia: Secondary | ICD-10-CM | POA: Insufficient documentation

## 2020-05-28 DIAGNOSIS — R482 Apraxia: Secondary | ICD-10-CM | POA: Insufficient documentation

## 2020-05-28 DIAGNOSIS — I255 Ischemic cardiomyopathy: Secondary | ICD-10-CM | POA: Diagnosis not present

## 2020-05-28 NOTE — Therapy (Signed)
[place] MAIN [facility] SERVICES [address] [place], [place], [address] Phone: [phone]   Fax:  [phone]  Speech Language Pathology Treatment  Patient Details  Name: Tracey [name] MRN: [id number] Date of Birth: 1955/01/31 Referring Provider (SLP): Emily Filbert, MD   Encounter Date: 05/28/2020   End of Session - 05/28/20 0919    Visit Number 8    Number of Visits 25    Date for SLP Re-Evaluation 07/29/20    Authorization Type AETNA 53 visits for ST max    Authorization - Visit Number 8    Progress Note Due on Visit 10    SLP Start Time 0855    SLP Stop Time  0950    SLP Time Calculation (min) 55 min    Activity Tolerance Patient tolerated treatment well           Past Medical History:  Diagnosis Date  . Anxiety   . Bronchitis   . CAD (coronary artery disease)    a. 04/2008: cariac arrest with prox LAD occ s/p stenting, arctic sun. b. 11/2008: restenosis s/p repeat stenting. c. 01/2011: abnormal stress test but cath reportedly OK.  Marland Kitchen Dysplastic nevus 11/04/2007   Ant. chest. Slight to moderate atypia, extends to one edge.   Marland Kitchen Dysplastic nevus 01/03/2009   Left breast. Minimal atypia, close to margin.  Marland Kitchen Hypothyroid   . Myocardial infarction [facility]) 2010   stents and cardiac rehab     Past Surgical History:  Procedure Laterality Date  . ANGIOPLASTY  2010   2 stents (femoral)   . BUBBLE STUDY  04/23/2020   Procedure: BUBBLE STUDY;  Surgeon: Geralynn Rile, MD;  Location: [place];  Service: Cardiovascular;;  . IR CT HEAD LTD  04/18/2020  . IR PERCUTANEOUS ART THROMBECTOMY/INFUSION INTRACRANIAL INC DIAG ANGIO  04/18/2020  . LOOP RECORDER INSERTION N/A 04/23/2020   Procedure: LOOP RECORDER INSERTION;  Surgeon: Vickie Epley, MD;  Location: [place] CV LAB;  Service: Cardiovascular;  Laterality: N/A;  . RADIOLOGY WITH ANESTHESIA N/A 04/18/2020   Procedure: IR WITH ANESTHESIA;  Surgeon: Radiologist, Medication,  MD;  Location: [place];  Service: Radiology;  Laterality: N/A;  . TEE WITHOUT CARDIOVERSION N/A 04/23/2020   Procedure: TRANSESOPHAGEAL ECHOCARDIOGRAM (TEE);  Surgeon: Geralynn Rile, MD;  Location: [place];  Service: Cardiovascular;  Laterality: N/A;  . TUBAL LIGATION      There were no vitals filed for this visit.   Subjective Assessment - 05/28/20 0918    Subjective Went to a friend's birthday this weekend.    Currently in Pain? No/denies                 ADULT SLP TREATMENT - 05/28/20 0918      General Information   Behavior/Cognition Alert;Cooperative    HPI Tracey [name] is a 65 y.o. female with a PMHx of CAD s/p MI, cardiac arrest, HTN, hypothryoidism, HLD, and anxiety. Presented to [facility] ED with slurred speech and word finding difficulty. CTA head and neck showed a distal M2 occlusion in left MCA branch. Per chart tPA emergently stopped when increased dysarthria noted> emergent head CT showed no intracranial hemorrhage. Pt transferred to [facility] and underwent revascularization of occluded Lt MCA following left MCA embolism. Seen by SLP during acute stay with diagnosis of global aphasia; however verbal output much improved by d/c on 04/20/20.      Treatment Provided   Treatment provided Cognitive-Linquistic  Pain Assessment   Pain Assessment No/denies pain      Cognitive-Linquistic Treatment   Treatment focused on Apraxia;Aphasia;Patient/family/caregiver education    Skilled Treatment Targeted agrammatism using functional materials (Awana readings); patient chose correct pronouns, functors and articles 100% accuracy with min verbal cue x1, self-corrected one error indepedently (to/do). Daughter reports pt continues to improve self-correction at home in her speech by slowing down. Used structured as well as functional tasks to target syntax and spelling at paragraph level. Pt wrote paragraph response to article of interest with extended time, initial min-mod  cues to review for errors, min-mod cues to correct 3 minor errors. Pt self-corrected errors in recipe writing task 90% accuracy. Generated subject-verb-object sentence using VNEST framework; to continue this task for homework.      Assessment / Recommendations / Plan   Plan Continue with current plan of care      Progression Toward Goals   Progression toward goals Progressing toward goals            SLP Education - 05/28/20 0919    Education Details activities for written communication    Person(s) Educated Patient    Methods Explanation    Comprehension Verbalized understanding            SLP Short Term Goals - 05/01/20 1724      SLP SHORT TERM GOAL #1   Title Patient will complete high-level wordfinding tasks >90% accuracy.    Time 10    Period --   sessions   Status New      SLP SHORT TERM GOAL #2   Title Patient will read multisyllabic words using compensations for apraxia >90% accuracy.    Time 10    Period Weeks    Status New      SLP SHORT TERM GOAL #3   Title Patient will demonstrate comprehension of paragraphs 8-10 sentences by answering questions >90% accuracy.    Time 10    Period --   sessions   Status New      SLP SHORT TERM GOAL #4   Title Patient will use compensations for aphasia and apraxia in 5-8 minutes mod complex conversation and self-correct breakdowns >90% of the time with modified independence.    Time 10    Period --   sessions   Status New            SLP Long Term Goals - 05/01/20 1728      SLP LONG TERM GOAL #1   Title Patient will report use of strategies for aphasia/apraxia to successfully make phone calls related to health, finances, and/or job functions.    Time 12    Period Weeks    Status New    Target Date 07/29/20      SLP LONG TERM GOAL #2   Title Patient will comprehend work-type emails 100% accuracy with use of compensations if needed (text-to-speech, etc).    Time 12    Period Weeks    Status New    Target Date  07/29/20      SLP LONG TERM GOAL #3   Title Patient will write work-type emails and self-correct >95% of errors with compensations allowed (speech-to-text, text-to-speech, double-checking).    Time 12    Period Weeks    Status New    Target Date 07/29/20      SLP LONG TERM GOAL #4   Title Patient will use compensations for anomia and apraxia functionally in 20+ minutes complex conversation, repairing breakdowns  independently.    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 Expressive deficits impact her ability to communicate verbally and more significantly in writing, limiting her ability to perform duties relating to work and community activities. Patient is receptive to use of compensations which increases her ability to participate in premorbid activities. Skilled ST for aphasia and apraxia remains necessary to maximize verbal and written expression as well as reading comprehension for return to PLOF and work/community activities.    Speech Therapy Frequency 2x / week    Duration 12 weeks    Treatment/Interventions Language facilitation;Environmental controls;Cueing hierarchy;SLP instruction and feedback;Functional tasks;Compensatory strategies;Cognitive reorganization;Internal/external aids;Multimodal communcation approach;Patient/family education    Potential to Achieve Goals Good    Potential Considerations Family/community support    SLP Home Exercise Plan see pt instructions    Consulted and Agree with Plan of Care Patient;Family member/caregiver           Patient will benefit from skilled therapeutic intervention in order to improve the following deficits and impairments:   Aphasia  Verbal apraxia  Middle cerebral artery embolism, left    Problem List Patient Active Problem List   Diagnosis Date Noted  .  Acute stroke due to ischemia ([place]) 04/18/2020  . Middle cerebral artery embolism, left 04/18/2020  . Atherosclerosis of native arteries of extremity with intermittent claudication ([place]) 10/05/2019  . GERD (gastroesophageal reflux disease) 09/26/2019  . Low serum vitamin D 08/25/2018  . B12 deficiency 11/21/2015  . Acquired hypothyroidism 08/09/2015  . Cardiomyopathy, ischemic 08/09/2015  . Hyperlipidemia, mixed 08/09/2015  . Dizziness 06/13/2012  . CAD (coronary artery disease) 06/13/2012  . H/O irritable bowel syndrome 10/20/2011   Deneise Lever, [place], [place] E Jaxsun Ciampi 05/28/2020, 4:03 PM  [place] MAIN [facility] SERVICES [address] [place], [place], [address] Phone: [phone]   Fax:  [phone]   Name: Tracey [name] MRN: [id number] Date of Birth: July 19, 1955

## 2020-05-29 LAB — CUP PACEART REMOTE DEVICE CHECK
Date Time Interrogation Session: 20220430163130
Implantable Pulse Generator Implant Date: 20220328

## 2020-05-30 ENCOUNTER — Other Ambulatory Visit: Payer: Self-pay

## 2020-05-30 ENCOUNTER — Ambulatory Visit: Payer: 59 | Admitting: Speech Pathology

## 2020-05-30 DIAGNOSIS — I6602 Occlusion and stenosis of left middle cerebral artery: Secondary | ICD-10-CM

## 2020-05-30 DIAGNOSIS — R4701 Aphasia: Secondary | ICD-10-CM | POA: Diagnosis not present

## 2020-05-30 DIAGNOSIS — R482 Apraxia: Secondary | ICD-10-CM

## 2020-05-31 NOTE — Therapy (Signed)
[place] MAIN [facility] SERVICES [address] [place], [place], [address] Phone: [phone]   Fax:  [phone]  Speech Language Pathology Treatment  Patient Details  Name: Tracey [name] MRN: [id number] Date of Birth: 1955/05/08 Referring Provider (SLP): Emily Filbert, MD   Encounter Date: 05/30/2020   End of Session - 05/31/20 0809    Visit Number 9    Number of Visits 25    Date for SLP Re-Evaluation 07/29/20    Authorization Type AETNA 14 visits for ST max    Authorization Time Period 04/30/20    Authorization - Visit Number 9    Progress Note Due on Visit 10    SLP Start Time 0900    SLP Stop Time  1000    SLP Time Calculation (min) 60 min    Activity Tolerance Patient tolerated treatment well           Past Medical History:  Diagnosis Date  . Anxiety   . Bronchitis   . CAD (coronary artery disease)    a. 04/2008: cariac arrest with prox LAD occ s/p stenting, arctic sun. b. 11/2008: restenosis s/p repeat stenting. c. 01/2011: abnormal stress test but cath reportedly OK.  Marland Kitchen Dysplastic nevus 11/04/2007   Ant. chest. Slight to moderate atypia, extends to one edge.   Marland Kitchen Dysplastic nevus 01/03/2009   Left breast. Minimal atypia, close to margin.  Marland Kitchen Hypothyroid   . Myocardial infarction [facility]) 2010   stents and cardiac rehab     Past Surgical History:  Procedure Laterality Date  . ANGIOPLASTY  2010   2 stents (femoral)   . BUBBLE STUDY  04/23/2020   Procedure: BUBBLE STUDY;  Surgeon: Geralynn Rile, MD;  Location: [place];  Service: Cardiovascular;;  . IR CT HEAD LTD  04/18/2020  . IR PERCUTANEOUS ART THROMBECTOMY/INFUSION INTRACRANIAL INC DIAG ANGIO  04/18/2020  . LOOP RECORDER INSERTION N/A 04/23/2020   Procedure: LOOP RECORDER INSERTION;  Surgeon: Vickie Epley, MD;  Location: [place] CV LAB;  Service: Cardiovascular;  Laterality: N/A;  . RADIOLOGY WITH ANESTHESIA N/A 04/18/2020   Procedure: IR WITH ANESTHESIA;   Surgeon: Radiologist, Medication, MD;  Location: [place];  Service: Radiology;  Laterality: N/A;  . TEE WITHOUT CARDIOVERSION N/A 04/23/2020   Procedure: TRANSESOPHAGEAL ECHOCARDIOGRAM (TEE);  Surgeon: Geralynn Rile, MD;  Location: [place];  Service: Cardiovascular;  Laterality: N/A;  . TUBAL LIGATION      There were no vitals filed for this visit.   Subjective Assessment - 05/31/20 0808    Subjective Arrives with [facility] completed    Patient is accompained by: Family member   daughter [name]   Currently in Pain? No/denies                 ADULT SLP TREATMENT - 05/31/20 0808      General Information   Behavior/Cognition Alert;Cooperative    HPI LOTTA FRANKENFIELD is a 65 y.o. female with a PMHx of CAD s/p MI, cardiac arrest, HTN, hypothryoidism, HLD, and anxiety. Presented to [facility] ED with slurred speech and word finding difficulty. CTA head and neck showed a distal M2 occlusion in left MCA branch. Per chart tPA emergently stopped when increased dysarthria noted> emergent head CT showed no intracranial hemorrhage. Pt transferred to [facility] and underwent revascularization of occluded Lt MCA following left MCA embolism. Seen by SLP during acute stay with diagnosis of global aphasia; however verbal output much improved by d/c on 04/20/20.  Treatment Provided   Treatment provided Cognitive-Linquistic      Cognitive-Linquistic Treatment   Treatment focused on Apraxia;Aphasia;Patient/family/caregiver education    Skilled Treatment Patient seen for skilled ST targeting aphasia. Reviewed VNEST homework; pt generated 6 sentences. Required verbal cue to review for accuracy; when she did so she ID'd and corrected 3/3 errors. Mod complex conversation with effective use of anomia compensations; required verbal cues x2 to correct apraxic breakdowns. Pt read information re: aphasia support groups using slow rate and independently correcting breakdowns. ID'd groups that may be of interest for  her (through [employer]).      Assessment / Recommendations / Plan   Plan Continue with current plan of care   pt to review TED talk and write summary for [facility]     Progression Toward Goals   Progression toward goals Progressing toward goals            SLP Education - 05/31/20 0809    Education Details aphasia groups    Person(s) Educated Patient;Child(ren)    Methods Explanation    Comprehension Verbalized understanding            SLP Short Term Goals - 05/01/20 1724      SLP SHORT TERM GOAL #1   Title Patient will complete high-level wordfinding tasks >90% accuracy.    Time 10    Period --   sessions   Status New      SLP SHORT TERM GOAL #2   Title Patient will read multisyllabic words using compensations for apraxia >90% accuracy.    Time 10    Period Weeks    Status New      SLP SHORT TERM GOAL #3   Title Patient will demonstrate comprehension of paragraphs 8-10 sentences by answering questions >90% accuracy.    Time 10    Period --   sessions   Status New      SLP SHORT TERM GOAL #4   Title Patient will use compensations for aphasia and apraxia in 5-8 minutes mod complex conversation and self-correct breakdowns >90% of the time with modified independence.    Time 10    Period --   sessions   Status New            SLP Long Term Goals - 05/01/20 1728      SLP LONG TERM GOAL #1   Title Patient will report use of strategies for aphasia/apraxia to successfully make phone calls related to health, finances, and/or job functions.    Time 12    Period Weeks    Status New    Target Date 07/29/20      SLP LONG TERM GOAL #2   Title Patient will comprehend work-type emails 100% accuracy with use of compensations if needed (text-to-speech, etc).    Time 12    Period Weeks    Status New    Target Date 07/29/20      SLP LONG TERM GOAL #3   Title Patient will write work-type emails and self-correct >95% of errors with compensations allowed  (speech-to-text, text-to-speech, double-checking).    Time 12    Period Weeks    Status New    Target Date 07/29/20      SLP LONG TERM GOAL #4   Title Patient will use compensations for anomia and apraxia functionally in 20+ minutes complex conversation, repairing breakdowns independently.    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 Expressive deficits impact her ability to communicate verbally and more significantly in writing, limiting her ability to perform duties relating to work and community activities. Patient is self-correcting more at home, per daughter. Continues to make errors in verbal and written expression but is able to self-correct 90% of the time with review. Skilled ST for aphasia and apraxia remains necessary to maximize verbal and written expression as well as reading comprehension for return to PLOF and work/community activities.    Speech Therapy Frequency 2x / week    Duration 12 weeks    Treatment/Interventions Language facilitation;Environmental controls;Cueing hierarchy;SLP instruction and feedback;Functional tasks;Compensatory strategies;Cognitive reorganization;Internal/external aids;Multimodal communcation approach;Patient/family education    Potential to Achieve Goals Good    Potential Considerations Family/community support    SLP Home Exercise Plan see pt instructions    Consulted and Agree with Plan of Care Patient;Family member/caregiver           Patient will benefit from skilled therapeutic intervention in order to improve the following deficits and impairments:   Aphasia  Verbal apraxia  Middle cerebral artery embolism, left    Problem List Patient Active Problem List   Diagnosis Date Noted  . Acute stroke due to ischemia ([place]) 04/18/2020  . Middle cerebral artery embolism, left  04/18/2020  . Atherosclerosis of native arteries of extremity with intermittent claudication ([place]) 10/05/2019  . GERD (gastroesophageal reflux disease) 09/26/2019  . Low serum vitamin D 08/25/2018  . B12 deficiency 11/21/2015  . Acquired hypothyroidism 08/09/2015  . Cardiomyopathy, ischemic 08/09/2015  . Hyperlipidemia, mixed 08/09/2015  . Dizziness 06/13/2012  . CAD (coronary artery disease) 06/13/2012  . H/O irritable bowel syndrome 10/20/2011   Deneise Lever, [place], [place] Speech-Language Pathologist  Aliene Altes 05/31/2020, 8:20 AM  [place] MAIN [facility] SERVICES [address] [place], [place], [address] Phone: [phone]   Fax:  [phone]   Name: SARAHJANE MATHERLY MRN: [id number] Date of Birth: 09/06/1955

## 2020-06-05 ENCOUNTER — Encounter: Payer: Self-pay | Admitting: Adult Health

## 2020-06-05 ENCOUNTER — Ambulatory Visit (INDEPENDENT_AMBULATORY_CARE_PROVIDER_SITE_OTHER): Payer: 59 | Admitting: Adult Health

## 2020-06-05 VITALS — BP 136/66 | HR 76 | Ht 65.0 in | Wt 209.0 lb

## 2020-06-05 DIAGNOSIS — I10 Essential (primary) hypertension: Secondary | ICD-10-CM

## 2020-06-05 DIAGNOSIS — E785 Hyperlipidemia, unspecified: Secondary | ICD-10-CM | POA: Diagnosis not present

## 2020-06-05 DIAGNOSIS — I69320 Aphasia following cerebral infarction: Secondary | ICD-10-CM

## 2020-06-05 DIAGNOSIS — I63512 Cerebral infarction due to unspecified occlusion or stenosis of left middle cerebral artery: Secondary | ICD-10-CM | POA: Diagnosis not present

## 2020-06-05 DIAGNOSIS — Z95818 Presence of other cardiac implants and grafts: Secondary | ICD-10-CM

## 2020-06-05 MED ORDER — TOPIRAMATE 25 MG PO TABS: 25.0000 mg | ORAL_TABLET | Freq: Two times a day (BID) | ORAL | 3 refills | Status: DC

## 2020-06-05 NOTE — Patient Instructions (Addendum)
Continue working with therapies for hopeful ongoing recovery. Due to residual deficits, would recommend extending short term disability for additional 4 months for further recovery time - please follow up in 3 months for re-evaluation and possible return to work   Start topamax 25mg  twice daily for headache prevention - please call after 1 week if you continue to experience headaches and we can discuss increasing dose  Your loop recorder has not shown atrial fibrillation thus far - will continue to monitor  Continue clopidogrel 75 mg daily  and atorvastatin  for secondary stroke prevention  Continue to follow up with PCP regarding cholesterol and blood pressure management  Maintain strict control of hypertension with blood pressure goal below 130/90 and cholesterol with LDL cholesterol (bad cholesterol) goal below 70 mg/dL.      Followup in the future with me in 3 months or call earlier if needed     Thank you for coming to see [place] at [facility]. I hope we have been able to provide you high quality care today.  You may receive a patient satisfaction survey over the next few weeks. We would appreciate your feedback and comments so that we may continue to improve ourselves and the health of our patients.      Aphasia Aphasia is a language disorder that affects the part of your brain that you use to communicate. Aphasia does not affect your intelligence, but you may have trouble:  Speaking.  Understanding speech.  Reading.  Writing. Some people with aphasia may also have trouble with memory or attention. Aphasia can happen to anyone at any age, but it is most common in older adults. What are the causes? This condition is caused by damage to the language centers of the brain. Damage may be caused by:  Stroke. This causes a disruption of blood flow to certain areas of the brain. Stroke is the most common cause of aphasia.  Traumatic brain injury  (TBI).  Brain tumor.  Infection of the brain tissues.  Nervous system disease that gradually gets worse (progressiveneurological disorder), such as dementia or multiple sclerosis (MS).  Brain surgery. What are the signs or symptoms? Symptoms of this condition include:  Trouble finding the right words.  Difficulty expressing thoughts and needs through speech.  Using the wrong words, nonsense words, or jargon.  Talking in sentences that do not make sense or that are not grammatically correct.  Being unable to repeat back words and phrases.  Difficulty expressing ideas through writing.  Difficulty comprehending when reading.  Being unable to understand other people's speech.  Having trouble understanding numbers. The condition affects people differently. Symptoms may start suddenly or come on gradually, depending on the underlying cause. How is this diagnosed? This condition may be diagnosed based on a screening of your ability to communicate as soon as symptoms start, or are medically stable after a stroke or brain injury. Later, a more comprehensive assessment may be conducted either in the hospital or rehabilitation center. The assessment may test your ability to:  Use speech to communicate your personal needs.  Use muscles in your mouth and throat for speaking and swallowing.  Express ideas with speech or other means of communication, such as hand gestures.  Make conversation with others across a variety of topics.  Hear and understand speech.  Understand and produce written material.  Manage memory and attention associated with communication. How is this treated? Treatment for this condition depends on your needs and abilities. The goal  is to help restore your ability to communicate or find ways to manage communication challenges. Common treatments include:  Speech-language therapy. Part of this may include: ? Re-building intonation, sentence structure, and  vocabulary. ? Learning other ways to communicate, such as using word books, communication boards, or special software programs. ? Learning to communicate with writing, sign language, or hand gestures. ? Using a combination of methods to communicate.  Working with family members. This may include: ? Learning ways to communicate. ? Emotional support.  Support groups.  Occupational therapy. This can help to find devices to assist with daily living activities. Treatment usually begins as soon as possible. It may begin while you are in the hospital and continue in a rehabilitation center or at home. In some cases, aphasia may improve quickly on its own. In other cases, recovery occurs more slowly over time. Follow these instructions at home:  Keep all follow-up visits as told by your health care provider. This is important.  Make sure you have a good support system at home.  Find a support group. This can help you connect with others who are going through the same thing.  Try the following tips while communicating: ? Use short, simple sentences. Ask family members to do the same. Sentences that require one-word or short answers are easiest. ? Avoid distractions like background noise when trying to listen or talk. ? Try communicating with gestures, pointing, writing, or drawing. ? Talk slowly. Ask family members to talk to you slowly. ? Maintain eye contact when communicating. Ask family members to do the same when communicating with you. ? Ask family members to give you time to respond or to engage in conversation with them.   Contact a health care provider if:  Your symptoms change or get worse.  You are struggling with anxiety or depression. Get help right away if you have:  Any symptoms of a stroke. "BE FAST" is an easy way to remember the main warning signs of a stroke: ? B - Balance. Signs are dizziness, sudden trouble walking, or loss of balance. ? E - Eyes. Signs are trouble  seeing or a sudden change in vision. ? F - Face. Signs are a sudden weakness or numbness of the face, or the face or eyelid drooping on one side. ? A - Arms. Signs are weakness or numbness in an arm. This happens suddenly and usually on one side of the body. ? S - Speech. Signs are sudden trouble speaking, slurred speech, or trouble understanding what people say. ? T - Time. Time to call emergency service. Write down what time symptoms started.  Other signs of a stroke, such as: ? A sudden, severe headache with no known cause. ? Nausea or vomiting. ? Seizure. Summary  Aphasia is a language disorder that results from damage to the part of your brain that helps you use language to communicate. Aphasia does not affect your intelligence, but may cause difficulty with speech, writing, reading, or understanding others.  In some cases, aphasia may improve quickly on its own. In other cases, recovery occurs more slowly over time.  Get help right away if you have symptoms of a stroke. This information is not intended to replace advice given to you by your health care provider. Make sure you discuss any questions you have with your health care provider. Document Revised: 12/31/2018 Document Reviewed: 02/10/2017 Elsevier Patient Education  [place].

## 2020-06-05 NOTE — Progress Notes (Signed)
[name] [address] [place]. [place] [address] [phone]       [place] RANAE KIDWELL Date of Birth:  December 18, 1955 Medical Record Number:  [id number]   Reason for Referral:  hospital stroke follow up    SUBJECTIVE:   CHIEF COMPLAINT:  Chief Complaint  Patient presents with  . Follow-up    TR with spouse (ricky) Pt is well, having speech difficulty as well as headaches about everyday, increased anxiety     HPI:   [name] is a 65 y.o. female with history of CAD s/p MI and cardiac arrest with stentins 04/2008, restenting 11/2018, claudication, HTN, hypothryoidism, HLD, and anxietywho presented on 04/18/2020 with aphasia.She was at work and seem to go into the bathroom at 9:15 AM. Later around 9:20 AM she was found on the floor aphasic and with some right-sided weakness.  She received [facility] at [facility] with near the end of infusion staff thought that her speech worsened so tPA stopped. CTH negative for ICH and transferred to [facility].  Stroke work-up revealed left MCA M2 branch stroke secondary to large vessel disease vs cardioembolic source.  Underwent successful revascularization by IR for M2 occlusion.  Loop recorder placed.  LDL 51 on Crestor 40 mg daily.  HTN stable.  Other stroke risk factors include former tobacco use stopped after MI, family history of stroke and CAD with MI in 2010.  Recommended DAPT for 3 weeks and Plavix alone.  Residual aphasia and alexia.  Evaluated by therapies who recommended outpatient PT/SLP and discharged home in stable condition.   Stroke - Left MCA M2 branchstrokesecondary to large vessel diseasevs. cardioembolic source  CT head Code Stroke -Small amount of white matter hypodensity within the posterior left frontal lobe. In the setting of an acute distal left M2 MCA branch occlusion  CT head-No evidence of acute intracranial hemorrhage status post tPA. No demarcated cortical  infarct. As before, there is subtle hypodensity within the posterior left frontal lobe white matter. In the setting of an acute distal left M2 MCA branch occlusion  CTA head & neck-Occluded distal M2 left MCA branch vessel. Mild non-stenotic calcified plaque within the intracranial ICAs bilaterally.  CT perfusion-The perfusion software identifies a 16 mL region of hypoperfusion within the posterior left MCA territory utilizing the Tmax>6 seconds threshold. The perfusion software identifies no core infarct. Mismatch volume: 16 mL.  MRI moderate size acute infarction in left parietal lobe with minimal additional small acute left parietal cortical infarcts.  No evidence of hemorrhagic transformation.  Mild chronic microvascular ischemic changes  MRA head preserved but decreased flow related enhancement at the site of distal left M2/M3 MCA thrombus on prior CTA  2D Echo- EF:45-50%. No wall motion abnormalities. Mid to apical anteroseptal akinesis.  DVT US - No evidence of DVT bilaterally  TEE interatrial septum aneurysmal.  Bubble study positive consistent with small to moderate sized PFO; no evidence of thrombus  Loop recorder placed  LDL51  HgbA1c5.4  aspirin 81 mg dailyprior to admission, now on aspirin 81 mg daily and clopidogrel 75 mg dailyfor 3 weeks then continue with Plavix alone.  Therapy recommendations:outpt PT/SLP  Disposition:home   Today, 05/28/2020, Tracey [name] is being seen for hospital follow-up accompanied by her husband, Tracey [name].   Reports continued speech difficulty including difficulty with reading and writing but has been making progress.  She is currently on short-term disability provided by Dr. Erlinda Hong during hospitalization and requests extension.  Previously  working as an Optometrist.  She is also been experiencing daily headaches appear more tension related to have been present since her stroke.  She does have remote history of migraines without any recent  migraine headaches and current headaches different from prior migraines.  She has been using Tylenol daily without much benefit.  Denies new stroke/TIA symptoms  Completed 3 weeks DAPT and remains on Plavix alone without associated side effects Remains on Crestor without associated side effects Blood pressure today 136/66 Loop recorder has not shown atrial fibrillation thus far  No further concerns at this time    ROS:   14 system review of systems performed and negative with exception of those listed in HPI  PMH:  Past Medical History:  Diagnosis Date  . Anxiety   . Bronchitis   . CAD (coronary artery disease)    a. 04/2008: cariac arrest with prox LAD occ s/p stenting, arctic sun. b. 11/2008: restenosis s/p repeat stenting. c. 01/2011: abnormal stress test but cath reportedly OK.  Marland Kitchen Dysplastic nevus 11/04/2007   Ant. chest. Slight to moderate atypia, extends to one edge.   Marland Kitchen Dysplastic nevus 01/03/2009   Left breast. Minimal atypia, close to margin.  Marland Kitchen Hypothyroid   . Myocardial infarction [facility]) 2010   stents and cardiac rehab     PSH:  Past Surgical History:  Procedure Laterality Date  . ANGIOPLASTY  2010   2 stents (femoral)   . BUBBLE STUDY  04/23/2020   Procedure: BUBBLE STUDY;  Surgeon: Geralynn Rile, MD;  Location: [facility];  Service: Cardiovascular;;  . IR CT HEAD LTD  04/18/2020  . IR PERCUTANEOUS ART THROMBECTOMY/INFUSION INTRACRANIAL INC DIAG ANGIO  04/18/2020  . LOOP RECORDER INSERTION N/A 04/23/2020   Procedure: LOOP RECORDER INSERTION;  Surgeon: Vickie Epley, MD;  Location: [facility];  Service: Cardiovascular;  Laterality: N/A;  . RADIOLOGY WITH ANESTHESIA N/A 04/18/2020   Procedure: IR WITH ANESTHESIA;  Surgeon: Radiologist, Medication, MD;  Location: [facility];  Service: Radiology;  Laterality: N/A;  . TEE WITHOUT CARDIOVERSION N/A 04/23/2020   Procedure: TRANSESOPHAGEAL ECHOCARDIOGRAM (TEE);  Surgeon: Geralynn Rile, MD;  Location:  [facility];  Service: Cardiovascular;  Laterality: N/A;  . TUBAL LIGATION      Social History:  Social History   Socioeconomic History  . Marital status: Married    Spouse name: Not on file  . Number of children: Not on file  . Years of education: Not on file  . Highest education level: Not on file  Occupational History  . Not on file  Tobacco Use  . Smoking status: Former Smoker    Packs/day: 1.50    Types: Cigarettes    Start date: 03/22/1973    Quit date: 04/27/2008    Years since quitting: 12.1  . Smokeless tobacco: Never Used  Vaping Use  . Vaping Use: Never used  Substance and Sexual Activity  . Alcohol use: No    Alcohol/week: 0.0 standard drinks  . Drug use: Never  . Sexual activity: Yes    Birth control/protection: Post-menopausal  Other Topics Concern  . Not on file  Social History Narrative  . Not on file   Social Determinants of Health   Financial Resource Strain: Not on file  Food Insecurity: Not on file  Transportation Needs: Not on file  Physical Activity: Not on file  Stress: Not on file  Social Connections: Not on file  Intimate Partner Violence: Not on file    Family  History:  Family History  Problem Relation Age of Onset  . Diabetes Mother   . Hypertension Mother   . Stroke Maternal Grandmother   . Diabetes Maternal Grandfather   . Stroke Maternal Grandfather     Medications:   Current Outpatient Medications on File Prior to Visit  Medication Sig Dispense Refill  . ALPRAZolam (XANAX) 0.25 MG tablet Take 0.25 mg by mouth daily as needed.    . Cholecalciferol 50 MCG (2000 UT) TABS Take 2,000 Units by mouth daily.    . clopidogrel (PLAVIX) 75 MG tablet Take 1 tablet (75 mg total) by mouth daily. 30 tablet 2  . cyanocobalamin (,VITAMIN B-12,) 1000 MCG/ML injection Inject 1,000 mcg into the muscle every 30 (thirty) days.    Marland Kitchen escitalopram (LEXAPRO) 10 MG tablet Take by mouth.    . furosemide (LASIX) 20 MG tablet Take 20 mg by mouth daily  as needed.    Marland Kitchen levothyroxine (SYNTHROID) 75 MCG tablet Take 75 mcg by mouth daily.    . metoprolol succinate (TOPROL-XL) 25 MG 24 hr tablet Take 25 mg by mouth daily.    . nitroGLYCERIN (NITROSTAT) 0.4 MG SL tablet Place 1 tablet (0.4 mg total) under the tongue every 5 (five) minutes as needed for chest pain. 25 tablet 3  . pantoprazole (PROTONIX) 40 MG tablet Take 40 mg by mouth daily.    . ramipril (ALTACE) 5 MG capsule Take 5 mg by mouth daily.    . rosuvastatin (CRESTOR) 40 MG tablet Take 40 mg by mouth daily.     No current facility-administered medications on file prior to visit.    Allergies:   Allergies  Allergen Reactions  . Sulfa Antibiotics Hives      OBJECTIVE:  Physical Exam  Vitals:   06/05/20 1507  BP: 136/66  Pulse: 76  Weight: 209 lb (94.8 kg)  Height: 5\' 5"  (1.651 m)   Body mass index is 34.78 kg/m. No exam data present  General: well developed, well nourished,  very pleasant middle-age Caucasian female, seated, in no evident distress Head: head normocephalic and atraumatic.   Neck: supple with no carotid or supraclavicular bruits Cardiovascular: regular rate and rhythm, no murmurs Musculoskeletal: no deformity Skin:  no rash/petichiae Vascular:  Normal pulses all extremities   Neurologic Exam Mental Status: Awake and fully alert.   Mild aphasia and apraxia.  Able to follow commands without difficulty.  Oriented to place and time. Recent and remote memory intact. Attention span, concentration and fund of knowledge appropriate. Mood and affect appropriate.  Cranial Nerves: Fundoscopic exam reveals sharp disc margins. Pupils equal, briskly reactive to light. Extraocular movements full without nystagmus. Visual fields full to confrontation. Hearing intact. Facial sensation intact. Face, tongue, palate moves normally and symmetrically.  Motor: Normal bulk and tone. Normal strength in all tested extremity muscles Sensory.: intact to touch , pinprick ,  position and vibratory sensation.  Coordination: Rapid alternating movements normal in all extremities. Finger-to-nose and heel-to-shin performed accurately bilaterally. Gait and Station: Arises from chair without difficulty. Stance is normal. Gait demonstrates normal stride length and balance without use of assistive device. Tandem walk and heel toe without difficulty.  Reflexes: 1+ and symmetric. Toes downgoing.     NIHSS  1 Modified Rankin  2      ASSESSMENT: Tracey [name] is a 65 y.o. year old female presented with aphasia on 04/18/2020 with stroke work-up revealing left MCA M2 branch stroke s/p tPA and IR secondary to large vessel disease vs  cardioembolic source s/p ILR. Vascular risk factors include +PFO on TEE, HTN, HLD, CAD, MI in 2010, former tobacco use (stopped after MI in 2010) and obesity.      PLAN:  1. L MCA stroke :  a. Residual deficit: Aphasia and verbal apraxia with deficits impacting her ability to communicate verbally and more significantly in writing limiting her ability to perform duties relating to her work. Will extend short-term disability for additional 4 months for further recovery time and continued participation with SLP- will reassess for potential return to work in 3 months.  b. Vascular headache: Initiate topiramate 25 mg twice daily c. Loop recorder has not shown atrial fibrillation thus far d. TEE evidence of small PFO not felt to be related or contributed to recent stroke e. continue clopidogrel 75 mg daily  and Crestor 40 mg daily for secondary stroke prevention.  Discussed secondary stroke prevention measures and importance of close PCP follow up for aggressive stroke risk factor management  2. HTN: BP goal <130/90.  Stable on current regimen per PCP 3. HLD: LDL goal <70. Recent LDL 51 on Crestor 40 mg daily per PCP.     Follow up in 3 months or call earlier if needed    CC:  [facility] provider: Dr. Leonie Man PCP: Rusty Aus, MD    I spent 52  minutes of face-to-face and non-face-to-face time with patient and husband.  This included previsit chart review including extensive review of recent hospitalization pertinent progress notes, lab work and imaging, extensive conversation regarding recent stroke and potential underlying etiologies, residual deficits and typical recovery time, educated on secondary stroke prevention measures and importance of close PCP follow-up and answered all other questions to patient and husband satisfactio  [name], AGNP-BC  [facility] [address] [place], [place] [address]  Phone [phone] Fax [phone] Note: This document was prepared with digital dictation and possible smart phrase technology. Any transcriptional errors that result from this process are unintentional.

## 2020-06-06 ENCOUNTER — Ambulatory Visit: Payer: 59 | Admitting: Speech Pathology

## 2020-06-06 ENCOUNTER — Encounter: Payer: Self-pay | Admitting: Adult Health

## 2020-06-07 ENCOUNTER — Telehealth: Payer: Self-pay | Admitting: *Deleted

## 2020-06-07 NOTE — Progress Notes (Signed)
I agree with the above plan 

## 2020-06-07 NOTE — Telephone Encounter (Signed)
Completed [employer] STD form.  Signed by JM/NP to medical records.

## 2020-06-11 ENCOUNTER — Telehealth: Payer: Self-pay | Admitting: *Deleted

## 2020-06-11 NOTE — Telephone Encounter (Signed)
[employer] form faxed on 06/11/20

## 2020-06-12 ENCOUNTER — Other Ambulatory Visit: Payer: Self-pay

## 2020-06-12 ENCOUNTER — Ambulatory Visit: Payer: 59 | Admitting: Speech Pathology

## 2020-06-12 DIAGNOSIS — I6602 Occlusion and stenosis of left middle cerebral artery: Secondary | ICD-10-CM

## 2020-06-12 DIAGNOSIS — R482 Apraxia: Secondary | ICD-10-CM

## 2020-06-12 DIAGNOSIS — R4701 Aphasia: Secondary | ICD-10-CM | POA: Diagnosis not present

## 2020-06-12 NOTE — Therapy (Signed)
[place] MAIN [facility] SERVICES [address] [place], [place], [address] Phone: [phone]   Fax:  [phone]  Speech Language Pathology Treatment and Progress Note  Patient Details  Name: Tracey [name] MRN: [id number] Date of Birth: 1955-08-02 Referring Provider (SLP): Emily Filbert, MD  Speech Therapy Progress Note  Dates of Reporting Period: 04/30/2020 to 06/12/2020  Patient is progressing toward LTGs, met 4/4 STGs this reporting period. See skilled intervention, goals, and clinical impressions below for details.   Encounter Date: 06/12/2020   End of Session - 06/12/20 1538    Visit Number 10    Number of Visits 25    Date for SLP Re-Evaluation 07/29/20    Authorization Type [employer] 60 visits for ST max    Authorization Time Period 04/30/20    Authorization - Visit Number 10    Progress Note Due on Visit 10    SLP Start Time 1300    SLP Stop Time  1350    SLP Time Calculation (min) 50 min    Activity Tolerance Patient tolerated treatment well           Past Medical History:  Diagnosis Date  . Anxiety   . Bronchitis   . CAD (coronary artery disease)    a. 04/2008: cariac arrest with prox LAD occ s/p stenting, arctic sun. b. 11/2008: restenosis s/p repeat stenting. c. 01/2011: abnormal stress test but cath reportedly OK.  Marland Kitchen Dysplastic nevus 11/04/2007   Ant. chest. Slight to moderate atypia, extends to one edge.   Marland Kitchen Dysplastic nevus 01/03/2009   Left breast. Minimal atypia, close to margin.  Marland Kitchen Hypothyroid   . Myocardial infarction [facility]) 2010   stents and cardiac rehab     Past Surgical History:  Procedure Laterality Date  . ANGIOPLASTY  2010   2 stents (femoral)   . BUBBLE STUDY  04/23/2020   Procedure: BUBBLE STUDY;  Surgeon: Geralynn Rile, MD;  Location: [place];  Service: Cardiovascular;;  . IR CT HEAD LTD  04/18/2020  . IR PERCUTANEOUS ART THROMBECTOMY/INFUSION INTRACRANIAL INC DIAG ANGIO  04/18/2020  . LOOP  RECORDER INSERTION N/A 04/23/2020   Procedure: LOOP RECORDER INSERTION;  Surgeon: Vickie Epley, MD;  Location: [place] CV LAB;  Service: Cardiovascular;  Laterality: N/A;  . RADIOLOGY WITH ANESTHESIA N/A 04/18/2020   Procedure: IR WITH ANESTHESIA;  Surgeon: Radiologist, Medication, MD;  Location: [place];  Service: Radiology;  Laterality: N/A;  . TEE WITHOUT CARDIOVERSION N/A 04/23/2020   Procedure: TRANSESOPHAGEAL ECHOCARDIOGRAM (TEE);  Surgeon: Geralynn Rile, MD;  Location: [place];  Service: Cardiovascular;  Laterality: N/A;  . TUBAL LIGATION      There were no vitals filed for this visit.   Subjective Assessment - 06/12/20 1528    Subjective Forgot to write response to TED Talk but did watch one.    Patient is accompained by: Family member   daughter [name]   Currently in Pain? No/denies                 ADULT SLP TREATMENT - 06/12/20 1529      General Information   Behavior/Cognition Alert;Cooperative    HPI Tracey [name] is a 65 y.o. female with a PMHx of CAD s/p MI, cardiac arrest, HTN, hypothryoidism, HLD, and anxiety. Presented to [facility] ED with slurred speech and word finding difficulty. CTA head and neck showed a distal M2 occlusion in left MCA branch. Per chart tPA emergently stopped when increased dysarthria  noted> emergent head CT showed no intracranial hemorrhage. Pt transferred to [facility] and underwent revascularization of occluded Lt MCA following left MCA embolism. Seen by SLP during acute stay with diagnosis of global aphasia; however verbal output much improved by d/c on 04/20/20.      Treatment Provided   Treatment provided Cognitive-Linquistic      Pain Assessment   Pain Assessment No/denies pain      Cognitive-Linquistic Treatment   Treatment focused on Apraxia;Aphasia;Patient/family/caregiver education    Skilled Treatment SLP facilitated 10 minutes mod complex conversation re: recent MD visit and return to work plan; patient used slow  rate and self-corrected apraxic errors with min cues. Patient reported visit with neurologist; has had disability extended for 3 months; still hoping to return to work. 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 Patient completed high-level wordfinding task (generating words for categories beginning with letter "h") 91% accuracy, 100% with min cues. Read multisyllabic words using compensations for apraxia (3 syllable 96% accuracy, 4 syllable 90% accuracy with self-correction). SLP targeted reading comprehension at moderately complex level; patient read 560 w article and answered comprehension questions 100% accuracy. Benefitted from extended time and independently referenced/re-read materials to aid her comprehension.      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    Time 10    Period --   sessions   Status Achieved      SLP SHORT TERM GOAL #2   Title Patient will read multisyllabic words using compensations for apraxia >90% accuracy.    Time 10    Period Weeks    Status Achieved      SLP SHORT TERM GOAL #3   Title Patient will demonstrate comprehension of paragraphs 8-10 sentences by answering questions >90% accuracy.    Time 10    Period --   sessions   Status Achieved      SLP SHORT TERM GOAL #4   Title Patient will use compensations for aphasia and apraxia in 5-8 minutes mod complex conversation and self-correct breakdowns >90%  of the time with modified independence.    Time 10    Period --   sessions   Status Achieved            SLP Long Term Goals - 06/12/20 1541      SLP LONG TERM GOAL #1   Title Patient will report use of strategies for aphasia/apraxia to successfully make phone calls related to health, finances, and/or job functions.    Time 12    Period Weeks    Status On-going    Target Date 07/29/20      SLP LONG TERM GOAL #2   Title Patient will comprehend work-type emails 100% accuracy with use of compensations if needed (text-to-speech, etc).    Time 12  Period Weeks    Status On-going    Target Date 07/29/20      SLP LONG TERM GOAL #3   Title Patient will write work-type emails and self-correct >95% of errors with compensations allowed (speech-to-text, text-to-speech, double-checking).    Time 12    Period Weeks    Status On-going    Target Date 07/29/20      SLP LONG TERM GOAL #4   Title Patient will use compensations for anomia and apraxia functionally in 20+ minutes complex conversation, repairing breakdowns independently.    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 Expressive deficits impact her ability to communicate verbally and more significantly in writing, limiting her ability to perform duties relating to work and community activities. Patient is self-correcting more at home, per daughter. Continues to make errors in verbal and written expression but is able to self-correct 90% of the time with review. Patient is making excellent progress toward goals and remains motivated to improve her expression and reading comprehension. She met 4/4 STG this reporting period. Skilled ST for aphasia and apraxia remains necessary to maximize verbal and written expression as well as reading  comprehension for return to PLOF and work/community activities.    Speech Therapy Frequency 2x / week    Duration 12 weeks    Treatment/Interventions Language facilitation;Environmental controls;Cueing hierarchy;SLP instruction and feedback;Functional tasks;Compensatory strategies;Cognitive reorganization;Internal/external aids;Multimodal communcation approach;Patient/family education    Potential to Achieve Goals Good    Potential Considerations Family/community support    SLP Home Exercise Plan see pt instructions    Consulted and Agree with Plan of Care Patient;Family member/caregiver           Patient will benefit from skilled therapeutic intervention in order to improve the following deficits and impairments:   Aphasia  Verbal apraxia  Middle cerebral artery embolism, left    Problem List Patient Active Problem List   Diagnosis Date Noted  . Acute stroke due to ischemia ([place]) 04/18/2020  . Middle cerebral artery embolism, left 04/18/2020  . Atherosclerosis of native arteries of extremity with intermittent claudication ([place]) 10/05/2019  . GERD (gastroesophageal reflux disease) 09/26/2019  . Low serum vitamin D 08/25/2018  . B12 deficiency 11/21/2015  . Acquired hypothyroidism 08/09/2015  . Cardiomyopathy, ischemic 08/09/2015  . Hyperlipidemia, mixed 08/09/2015  . Dizziness 06/13/2012  . CAD (coronary artery disease) 06/13/2012  . H/O irritable bowel syndrome 10/20/2011   Deneise Lever, [place], [place] E Aryel Edelen 06/12/2020, 3:42 PM  [place] MAIN [facility] SERVICES [address] [place], [place], [address] Phone: [phone]   Fax:  [phone]   Name: XIOMARA SEVILLANO MRN: [id number] Date of Birth: 1955/04/05

## 2020-06-14 ENCOUNTER — Other Ambulatory Visit: Payer: Self-pay

## 2020-06-14 ENCOUNTER — Ambulatory Visit: Payer: 59 | Admitting: Speech Pathology

## 2020-06-14 DIAGNOSIS — R4701 Aphasia: Secondary | ICD-10-CM

## 2020-06-14 DIAGNOSIS — I6602 Occlusion and stenosis of left middle cerebral artery: Secondary | ICD-10-CM

## 2020-06-14 DIAGNOSIS — R482 Apraxia: Secondary | ICD-10-CM

## 2020-06-14 NOTE — Therapy (Signed)
[place] MAIN [facility] SERVICES [address] [place], [place], [address] Phone: [phone]   Fax:  [phone]  Speech Language Pathology Treatment  Patient Details  Name: Tracey [name] MRN: [id number] Date of Birth: 05-24-1955 Referring Provider (SLP): Emily Filbert, MD   Encounter Date: 06/14/2020   End of Session - 06/14/20 1702    Visit Number 11    Number of Visits 25    Date for SLP Re-Evaluation 07/29/20    Authorization Type AETNA 59 visits for ST max    Authorization Time Period 06/12/20    Authorization - Visit Number 1    Progress Note Due on Visit 10    SLP Start Time 1500    SLP Stop Time  1600    SLP Time Calculation (min) 60 min    Activity Tolerance Patient tolerated treatment well           Past Medical History:  Diagnosis Date  . Anxiety   . Bronchitis   . CAD (coronary artery disease)    a. 04/2008: cariac arrest with prox LAD occ s/p stenting, arctic sun. b. 11/2008: restenosis s/p repeat stenting. c. 01/2011: abnormal stress test but cath reportedly OK.  Marland Kitchen Dysplastic nevus 11/04/2007   Ant. chest. Slight to moderate atypia, extends to one edge.   Marland Kitchen Dysplastic nevus 01/03/2009   Left breast. Minimal atypia, close to margin.  Marland Kitchen Hypothyroid   . Myocardial infarction [facility]) 2010   stents and cardiac rehab     Past Surgical History:  Procedure Laterality Date  . ANGIOPLASTY  2010   2 stents (femoral)   . BUBBLE STUDY  04/23/2020   Procedure: BUBBLE STUDY;  Surgeon: Geralynn Rile, MD;  Location: [place];  Service: Cardiovascular;;  . IR CT HEAD LTD  04/18/2020  . IR PERCUTANEOUS ART THROMBECTOMY/INFUSION INTRACRANIAL INC DIAG ANGIO  04/18/2020  . LOOP RECORDER INSERTION N/A 04/23/2020   Procedure: LOOP RECORDER INSERTION;  Surgeon: Vickie Epley, MD;  Location: [place] CV LAB;  Service: Cardiovascular;  Laterality: N/A;  . RADIOLOGY WITH ANESTHESIA N/A 04/18/2020   Procedure: IR WITH  ANESTHESIA;  Surgeon: Radiologist, Medication, MD;  Location: [place];  Service: Radiology;  Laterality: N/A;  . TEE WITHOUT CARDIOVERSION N/A 04/23/2020   Procedure: TRANSESOPHAGEAL ECHOCARDIOGRAM (TEE);  Surgeon: Geralynn Rile, MD;  Location: [place];  Service: Cardiovascular;  Laterality: N/A;  . TUBAL LIGATION      There were no vitals filed for this visit.   Subjective Assessment - 06/14/20 1654    Subjective Brought Sunday school materials    Patient is accompained by: Family member   daughter Tracey [name]   Currently in Pain? No/denies                 ADULT SLP TREATMENT - 06/14/20 1655      General Information   Behavior/Cognition Alert;Cooperative    HPI Tracey [name] is a 65 y.o. female with a PMHx of CAD s/p MI, cardiac arrest, HTN, hypothryoidism, HLD, and anxiety. Presented to [facility] ED with slurred speech and word finding difficulty. CTA head and neck showed a distal M2 occlusion in left MCA branch. Per chart tPA emergently stopped when increased dysarthria noted> emergent head CT showed no intracranial hemorrhage. Pt transferred to [facility] and underwent revascularization of occluded Lt MCA following left MCA embolism. Seen by SLP during acute stay with diagnosis of global aphasia; however verbal output much improved by d/c on 04/20/20.  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 Patient generated script for instructing her class with initial min cues and wrote this with error correction 100% accuracy. Generated non-verbal communication ideas for quieting/gaining attention of class with min cues. Patient reported she tried to make a quick comment in passing to some young  boys at church but couldn't get her words out. This frustrated her. Educated on energy/resource management, as well as possible script training some phrases she can use when having a communication breakdown.      Assessment / Recommendations / Plan   Plan Continue with current plan of care      Progression Toward Goals   Progression toward goals Progressing toward goals            SLP Education - 06/14/20 1701    Education Details script training    Person(s) Educated Patient    Methods Explanation    Comprehension Verbalized understanding            SLP Short Term Goals - 06/12/20 1540      SLP SHORT TERM GOAL #1   Title Patient will complete high-level wordfinding tasks >90% accuracy.    Time 10    Period --   sessions   Status Achieved      SLP SHORT TERM GOAL #2   Title Patient will read multisyllabic words using compensations for apraxia >90% accuracy.    Time 10    Period Weeks    Status Achieved      SLP SHORT TERM GOAL #3   Title Patient will demonstrate comprehension of paragraphs 8-10 sentences by answering questions >90% accuracy.    Time 10    Period --   sessions   Status Achieved      SLP SHORT TERM GOAL #4   Title Patient will use compensations for aphasia and apraxia in 5-8 minutes mod complex conversation and self-correct breakdowns >90% of the time with modified independence.    Time 10    Period --   sessions   Status Achieved            SLP Long Term Goals - 06/12/20 1541      SLP LONG TERM GOAL #1   Title Patient will report use of strategies for aphasia/apraxia to successfully make phone calls related to health, finances, and/or job functions.    Time 12    Period Weeks    Status On-going    Target Date 07/29/20      SLP LONG TERM GOAL #2   Title Patient will comprehend work-type emails 100% accuracy with use of compensations if needed (text-to-speech, etc).    Time 12    Period Weeks    Status On-going    Target Date 07/29/20       SLP LONG TERM GOAL #3   Title Patient will write work-type emails and self-correct >95% of errors with compensations allowed (speech-to-text, text-to-speech, double-checking).    Time 12    Period Weeks    Status On-going    Target Date 07/29/20      SLP LONG TERM GOAL #4   Title Patient will use compensations for anomia and apraxia functionally in 20+ minutes complex conversation, repairing breakdowns independently.  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 Expressive deficits impact her ability to communicate verbally and more significantly in writing, limiting her ability to perform duties relating to work and community activities. Patient reports greater difficulty in the evenings when she is fatigued; she expressed today that she misses the ability to make spontaneous quips, and doesn't feel comfortable doing so with people she doesn't know well. Skilled ST for aphasia and apraxia remains necessary to maximize verbal and written expression as well as reading comprehension for return to PLOF and work/community activities.    Speech Therapy Frequency 2x / week    Duration 12 weeks    Treatment/Interventions Language facilitation;Environmental controls;Cueing hierarchy;SLP instruction and feedback;Functional tasks;Compensatory strategies;Cognitive reorganization;Internal/external aids;Multimodal communcation approach;Patient/family education    Potential to Achieve Goals Good    Potential Considerations Family/community support    SLP Home Exercise Plan see pt instructions    Consulted and Agree with Plan of Care Patient;Family member/caregiver           Patient will benefit from skilled therapeutic intervention in order to improve the following deficits and impairments:   Verbal  apraxia  Aphasia  Middle cerebral artery embolism, left    Problem List Patient Active Problem List   Diagnosis Date Noted  . Acute stroke due to ischemia ([place]) 04/18/2020  . Middle cerebral artery embolism, left 04/18/2020  . Atherosclerosis of native arteries of extremity with intermittent claudication ([place]) 10/05/2019  . GERD (gastroesophageal reflux disease) 09/26/2019  . Low serum vitamin D 08/25/2018  . B12 deficiency 11/21/2015  . Acquired hypothyroidism 08/09/2015  . Cardiomyopathy, ischemic 08/09/2015  . Hyperlipidemia, mixed 08/09/2015  . Dizziness 06/13/2012  . CAD (coronary artery disease) 06/13/2012  . H/O irritable bowel syndrome 10/20/2011   Deneise Lever, [place], [place] 06/14/2020, 5:05 PM  [place] MAIN [facility] SERVICES [address] [place], [place], [address] Phone: [phone]   Fax:  [phone]   Name: Tracey [name] MRN: [id number] Date of Birth: Sep 18, 1955

## 2020-06-15 NOTE — Progress Notes (Signed)
[facility] Summary Report / Loop Recorder 

## 2020-06-19 ENCOUNTER — Ambulatory Visit: Payer: 59 | Admitting: Speech Pathology

## 2020-06-19 ENCOUNTER — Other Ambulatory Visit: Payer: Self-pay

## 2020-06-19 DIAGNOSIS — I6602 Occlusion and stenosis of left middle cerebral artery: Secondary | ICD-10-CM

## 2020-06-19 DIAGNOSIS — R4701 Aphasia: Secondary | ICD-10-CM | POA: Diagnosis not present

## 2020-06-20 NOTE — Therapy (Signed)
[place] MAIN [facility] SERVICES [address] [place], [place], [address] Phone: [phone]   Fax:  [phone]  Speech Language Pathology Treatment  Patient Details  Name: Tracey [name] MRN: [id number] Date of Birth: 1955-11-13 Referring Provider (SLP): Emily Filbert, MD   Encounter Date: 06/19/2020   End of Session - 06/20/20 0846    Visit Number 12    Number of Visits 25    Date for SLP Re-Evaluation 07/29/20    Authorization Type [employer]ET[employer] 60 visits for ST max    Authorization Time Period 06/12/20    Authorization - Visit Number 2    Progress Note Due on Visit 10    SLP Start Time 1300    SLP Stop Time  1400    SLP Time Calculation (min) 60 min    Activity Tolerance Patient tolerated treatment well           Past Medical History:  Diagnosis Date  . Anxiety   . Bronchitis   . CAD (coronary artery disease)    a. 04/2008: cariac arrest with prox LAD occ s/p stenting, arctic sun. b. 11/2008: restenosis s/p repeat stenting. c. 01/2011: abnormal stress test but cath reportedly OK.  Marland Kitchen Dysplastic nevus 11/04/2007   Ant. chest. Slight to moderate atypia, extends to one edge.   Marland Kitchen Dysplastic nevus 01/03/2009   Left breast. Minimal atypia, close to margin.  Marland Kitchen Hypothyroid   . Myocardial infarction [facility]) 2010   stents and cardiac rehab     Past Surgical History:  Procedure Laterality Date  . ANGIOPLASTY  2010   2 stents (femoral)   . BUBBLE STUDY  04/23/2020   Procedure: BUBBLE STUDY;  Surgeon: Geralynn Rile, MD;  Location: [place];  Service: Cardiovascular;;  . IR CT HEAD LTD  04/18/2020  . IR PERCUTANEOUS ART THROMBECTOMY/INFUSION INTRACRANIAL INC DIAG ANGIO  04/18/2020  . LOOP RECORDER INSERTION N/A 04/23/2020   Procedure: LOOP RECORDER INSERTION;  Surgeon: Vickie Epley, MD;  Location: [place] CV LAB;  Service: Cardiovascular;  Laterality: N/A;  . RADIOLOGY WITH ANESTHESIA N/A 04/18/2020   Procedure: IR WITH  ANESTHESIA;  Surgeon: Radiologist, Medication, MD;  Location: [place];  Service: Radiology;  Laterality: N/A;  . TEE WITHOUT CARDIOVERSION N/A 04/23/2020   Procedure: TRANSESOPHAGEAL ECHOCARDIOGRAM (TEE);  Surgeon: Geralynn Rile, MD;  Location: [place];  Service: Cardiovascular;  Laterality: N/A;  . TUBAL LIGATION      There were no vitals filed for this visit.   Subjective Assessment - 06/20/20 8144    Subjective "Everyone was watching me."    Patient is accompained by: Family member   daughter [name]   Currently in Pain? No/denies                 ADULT SLP TREATMENT - 06/20/20 8185      General Information   Behavior/Cognition Alert;Cooperative    HPI Tracey [name] is a 65 y.o. female with a PMHx of CAD s/p MI, cardiac arrest, HTN, hypothryoidism, HLD, and anxiety. Presented to [facility] ED with slurred speech and word finding difficulty. CTA head and neck showed a distal M2 occlusion in left MCA branch. Per chart tPA emergently stopped when increased dysarthria noted> emergent head CT showed no intracranial hemorrhage. Pt transferred to [facility] and underwent revascularization of occluded Lt MCA following left MCA embolism. Seen by SLP during acute stay with diagnosis of global aphasia; however verbal output much improved by d/c on 04/20/20.  Treatment Provided   Treatment provided Cognitive-Linquistic      Pain Assessment   Pain Assessment No/denies pain      Cognitive-Linquistic Treatment   Treatment focused on Apraxia;Aphasia;Patient/family/caregiver education    Skilled Treatment Patient reported going to her office with her daughter to show a coworker her process for some tasks that need to be completed while she is on leave. She reported initially she was flustered but she was able to share the information. Targeted writing for emails with functional task by having pt type mock email to a coworker about how to complete lien notice filing. Patient re-read and  corrected 100% of errors. Required verbal cue partway through task to verbalize while typing, which reduced frequency of errors. Discussed reading and patient admitted she often clicks on news articles from Facebook or her worship group but then decides not read them when she sees how long they are. Experimented using text-to-speech to aid comprehension but automated voice was disruptive for pt. Used text highlighting with sound off with slowed rate; patient able to read 3 paragraphs and provide simple verbal summary (requested re-reading paragraph x1). Assigned reading exercise on [place]; pt to write brief article summary after reading.      Assessment / Recommendations / Plan   Plan Continue with current plan of care      Progression Toward Goals   Progression toward goals Progressing toward goals            SLP Education - 06/20/20 0846    Education Details verbalize sentence while typing    Person(s) Educated Patient    Methods Explanation    Comprehension Verbalized understanding;Verbal cues required;Need further instruction            SLP Short Term Goals - 06/12/20 1540      SLP SHORT TERM GOAL #1   Title Patient will complete high-level wordfinding tasks >90% accuracy.    Time 10    Period --   sessions   Status Achieved      SLP SHORT TERM GOAL #2   Title Patient will read multisyllabic words using compensations for apraxia >90% accuracy.    Time 10    Period Weeks    Status Achieved      SLP SHORT TERM GOAL #3   Title Patient will demonstrate comprehension of paragraphs 8-10 sentences by answering questions >90% accuracy.    Time 10    Period --   sessions   Status Achieved      SLP SHORT TERM GOAL #4   Title Patient will use compensations for aphasia and apraxia in 5-8 minutes mod complex conversation and self-correct breakdowns >90% of the time with modified independence.    Time 10    Period --   sessions   Status Achieved            SLP Long Term  Goals - 06/12/20 1541      SLP LONG TERM GOAL #1   Title Patient will report use of strategies for aphasia/apraxia to successfully make phone calls related to health, finances, and/or job functions.    Time 12    Period Weeks    Status On-going    Target Date 07/29/20      SLP LONG TERM GOAL #2   Title Patient will comprehend work-type emails 100% accuracy with use of compensations if needed (text-to-speech, etc).    Time 12    Period Weeks    Status On-going    Target  Date 07/29/20      SLP LONG TERM GOAL #3   Title Patient will write work-type emails and self-correct >95% of errors with compensations allowed (speech-to-text, text-to-speech, double-checking).    Time 12    Period Weeks    Status On-going    Target Date 07/29/20      SLP LONG TERM GOAL #4   Title Patient will use compensations for anomia and apraxia functionally in 20+ minutes complex conversation, repairing breakdowns independently.    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 Expressive deficits impact her ability to communicate verbally and more significantly in writing, limiting her ability to perform duties relating to work and community activities. Patient used compensations and self-corrected errors for simple work-type email today; requires extended time and re-reading for mod complex multi-paragraph reading. Verbal expression was very fluent today; WFL with pt self-correcting errors (fewer than previous session); when fatigued she has greater difficulty with speech. Skilled ST for aphasia and apraxia remains necessary to maximize verbal and written expression as well as reading comprehension for return to PLOF and work/community activities.    Speech Therapy Frequency 2x / week    Duration 12 weeks     Treatment/Interventions Language facilitation;Environmental controls;Cueing hierarchy;SLP instruction and feedback;Functional tasks;Compensatory strategies;Cognitive reorganization;Internal/external aids;Multimodal communcation approach;Patient/family education    Potential to Achieve Goals Good    Potential Considerations Family/community support    SLP Home Exercise Plan see pt instructions    Consulted and Agree with Plan of Care Patient;Family member/caregiver           Patient will benefit from skilled therapeutic intervention in order to improve the following deficits and impairments:   Aphasia  Middle cerebral artery embolism, left    Problem List Patient Active Problem List   Diagnosis Date Noted  . Acute stroke due to ischemia ([place]) 04/18/2020  . Middle cerebral artery embolism, left 04/18/2020  . Atherosclerosis of native arteries of extremity with intermittent claudication ([place]) 10/05/2019  . GERD (gastroesophageal reflux disease) 09/26/2019  . Low serum vitamin D 08/25/2018  . B12 deficiency 11/21/2015  . Acquired hypothyroidism 08/09/2015  . Cardiomyopathy, ischemic 08/09/2015  . Hyperlipidemia, mixed 08/09/2015  . Dizziness 06/13/2012  . CAD (coronary artery disease) 06/13/2012  . H/O irritable bowel syndrome 10/20/2011   Deneise Lever, [place], [place] Speech-Language Pathologist  Aliene Altes 06/20/2020, 8:50 AM  [place] [address] [place], [place], [address] Phone: [phone]   Fax:  [phone]   Name: DULCIE GAMMON MRN: [id number] Date of Birth: 1955/05/26

## 2020-06-21 ENCOUNTER — Other Ambulatory Visit: Payer: Self-pay

## 2020-06-21 ENCOUNTER — Ambulatory Visit: Payer: 59 | Admitting: Speech Pathology

## 2020-06-21 DIAGNOSIS — R4701 Aphasia: Secondary | ICD-10-CM | POA: Diagnosis not present

## 2020-06-21 DIAGNOSIS — R482 Apraxia: Secondary | ICD-10-CM

## 2020-06-21 DIAGNOSIS — I6602 Occlusion and stenosis of left middle cerebral artery: Secondary | ICD-10-CM

## 2020-06-21 NOTE — Therapy (Signed)
[place] MAIN [facility] SERVICES [address] [place], [place], [address] Phone: [phone]   Fax:  [phone]  Speech Language Pathology Treatment  Patient Details  Name: Tracey [name] MRN: [id number] Date of Birth: December 10, 1955 Referring Provider (SLP): Tracey Filbert, MD   Encounter Date: 06/21/2020   End of Session - 06/21/20 1248    Visit Number 13    Number of Visits 25    Date for SLP Re-Evaluation 07/29/20    Authorization Type AETNA 27 visits for ST max    Authorization Time Period 06/12/20    Authorization - Visit Number 3    Progress Note Due on Visit 10    SLP Start Time 1100    SLP Stop Time  1200    SLP Time Calculation (min) 60 min    Activity Tolerance Patient tolerated treatment well           Past Medical History:  Diagnosis Date  . Anxiety   . Bronchitis   . CAD (coronary artery disease)    a. 04/2008: cariac arrest with prox LAD occ s/p stenting, arctic sun. b. 11/2008: restenosis s/p repeat stenting. c. 01/2011: abnormal stress test but cath reportedly OK.  Marland Kitchen Dysplastic nevus 11/04/2007   Ant. chest. Slight to moderate atypia, extends to one edge.   Marland Kitchen Dysplastic nevus 01/03/2009   Left breast. Minimal atypia, close to margin.  Marland Kitchen Hypothyroid   . Myocardial infarction [facility]) 2010   stents and cardiac rehab     Past Surgical History:  Procedure Laterality Date  . ANGIOPLASTY  2010   2 stents (femoral)   . BUBBLE STUDY  04/23/2020   Procedure: BUBBLE STUDY;  Surgeon: Geralynn Rile, MD;  Location: [place];  Service: Cardiovascular;;  . IR CT HEAD LTD  04/18/2020  . IR PERCUTANEOUS ART THROMBECTOMY/INFUSION INTRACRANIAL INC DIAG ANGIO  04/18/2020  . LOOP RECORDER INSERTION N/A 04/23/2020   Procedure: LOOP RECORDER INSERTION;  Surgeon: Vickie Epley, MD;  Location: [place] CV LAB;  Service: Cardiovascular;  Laterality: N/A;  . RADIOLOGY WITH ANESTHESIA N/A 04/18/2020   Procedure: IR WITH  ANESTHESIA;  Surgeon: Radiologist, Medication, MD;  Location: [place];  Service: Radiology;  Laterality: N/A;  . TEE WITHOUT CARDIOVERSION N/A 04/23/2020   Procedure: TRANSESOPHAGEAL ECHOCARDIOGRAM (TEE);  Surgeon: Geralynn Rile, MD;  Location: [place];  Service: Cardiovascular;  Laterality: N/A;  . TUBAL LIGATION      There were no vitals filed for this visit.   Subjective Assessment - 06/21/20 1243    Subjective Reports she is looking forward to beach trip with her family.    Patient is accompained by: Family member   daughter [name]   Currently in Pain? No/denies                 ADULT SLP TREATMENT - 06/21/20 1243      General Information   Behavior/Cognition Alert;Cooperative    HPI Tracey [name] is a 65 y.o. female with a PMHx of CAD s/p MI, cardiac arrest, HTN, hypothryoidism, HLD, and anxiety. Presented to [facility] ED with slurred speech and word finding difficulty. CTA head and neck showed a distal M2 occlusion in left MCA branch. Per chart tPA emergently stopped when increased dysarthria noted> emergent head CT showed no intracranial hemorrhage. Pt transferred to [facility] and underwent revascularization of occluded Lt MCA following left MCA embolism. Seen by SLP during acute stay with diagnosis of global aphasia; however verbal output  much improved by d/c on 04/20/20.      Treatment Provided   Treatment provided Cognitive-Linquistic      Cognitive-Linquistic Treatment   Treatment focused on Apraxia;Aphasia;Patient/family/caregiver education    Skilled Treatment SLP facilitated mod complex conversation, during which pt used compensations for aphasia and apraxia, and self-corrected apraxic errors 95% of the time. Simulated work task with [place] entry of numerical information; pt accuracy was 90%; she required cues for double-checking to ID errors. Educated that pt errors likely linguistic vs cognitive in nature and educated on strategies pt could use to  reduce errors (verbalizing while typing, double check numbers). Provided article for home practice as well as suggestion for tasks for home to simulate work functions.      Assessment / Recommendations / Plan   Plan Continue with current plan of care      Progression Toward Goals   Progression toward goals Progressing toward goals            SLP Education - 06/21/20 1248    Education Details tasks to simulate work functions    Person(s) Educated Patient    Methods Explanation    Comprehension Verbalized understanding            SLP Short Term Goals - 06/12/20 1540      SLP SHORT TERM GOAL #1   Title Patient will complete high-level wordfinding tasks >90% accuracy.    Time 10    Period --   sessions   Status Achieved      SLP SHORT TERM GOAL #2   Title Patient will read multisyllabic words using compensations for apraxia >90% accuracy.    Time 10    Period Weeks    Status Achieved      SLP SHORT TERM GOAL #3   Title Patient will demonstrate comprehension of paragraphs 8-10 sentences by answering questions >90% accuracy.    Time 10    Period --   sessions   Status Achieved      SLP SHORT TERM GOAL #4   Title Patient will use compensations for aphasia and apraxia in 5-8 minutes mod complex conversation and self-correct breakdowns >90% of the time with modified independence.    Time 10    Period --   sessions   Status Achieved            SLP Long Term Goals - 06/12/20 1541      SLP LONG TERM GOAL #1   Title Patient will report use of strategies for aphasia/apraxia to successfully make phone calls related to health, finances, and/or job functions.    Time 12    Period Weeks    Status On-going    Target Date 07/29/20      SLP LONG TERM GOAL #2   Title Patient will comprehend work-type emails 100% accuracy with use of compensations if needed (text-to-speech, etc).    Time 12    Period Weeks    Status On-going    Target Date 07/29/20      SLP LONG TERM GOAL  #3   Title Patient will write work-type emails and self-correct >95% of errors with compensations allowed (speech-to-text, text-to-speech, double-checking).    Time 12    Period Weeks    Status On-going    Target Date 07/29/20      SLP LONG TERM GOAL #4   Title Patient will use compensations for anomia and apraxia functionally in 20+ minutes complex conversation, repairing breakdowns independently.  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 Expressive deficits impact her ability to communicate verbally and more significantly in writing, limiting her ability to perform duties relating to work and community activities. Patient required cues to ID errors with numeric data entry in simulated work task; errors appear attributable to linguistic vs cognitive deficits. Verbal expression was very fluent today; WFL with pt self-correcting errors (fewer than previous session); when fatigued she has greater difficulty with speech. Skilled ST for aphasia and apraxia remains necessary to maximize verbal and written expression as well as reading comprehension for return to PLOF and work/community activities.    Speech Therapy Frequency 2x / week    Duration 12 weeks    Treatment/Interventions Language facilitation;Environmental controls;Cueing hierarchy;SLP instruction and feedback;Functional tasks;Compensatory strategies;Cognitive reorganization;Internal/external aids;Multimodal communcation approach;Patient/family education    Potential to Achieve Goals Good    Potential Considerations Family/community support    SLP Home Exercise Plan see pt instructions    Consulted and Agree with Plan of Care Patient;Family member/caregiver           Patient will benefit from skilled therapeutic intervention in order to  improve the following deficits and impairments:   Aphasia  Middle cerebral artery embolism, left  Verbal apraxia    Problem List Patient Active Problem List   Diagnosis Date Noted  . Acute stroke due to ischemia ([place]) 04/18/2020  . Middle cerebral artery embolism, left 04/18/2020  . Atherosclerosis of native arteries of extremity with intermittent claudication ([place]) 10/05/2019  . GERD (gastroesophageal reflux disease) 09/26/2019  . Low serum vitamin D 08/25/2018  . B12 deficiency 11/21/2015  . Acquired hypothyroidism 08/09/2015  . Cardiomyopathy, ischemic 08/09/2015  . Hyperlipidemia, mixed 08/09/2015  . Dizziness 06/13/2012  . CAD (coronary artery disease) 06/13/2012  . H/O irritable bowel syndrome 10/20/2011   Deneise Lever, [place], [place] Speech-Language Pathologist  Aliene Altes 06/21/2020, 12:51 PM  [place] MAIN [facility] SERVICES [address] [place], [place], [address] Phone: [phone]   Fax:  [phone]   Name: Tracey [name] MRN: [id number] Date of Birth: July 11, 1955

## 2020-06-26 ENCOUNTER — Encounter: Payer: 59 | Admitting: Speech Pathology

## 2020-06-28 ENCOUNTER — Ambulatory Visit: Payer: 59 | Admitting: Speech Pathology

## 2020-07-01 LAB — CUP PACEART REMOTE DEVICE CHECK
Date Time Interrogation Session: 20220602162942
Implantable Pulse Generator Implant Date: 20220328

## 2020-07-02 ENCOUNTER — Ambulatory Visit (INDEPENDENT_AMBULATORY_CARE_PROVIDER_SITE_OTHER): Payer: 59

## 2020-07-02 DIAGNOSIS — I255 Ischemic cardiomyopathy: Secondary | ICD-10-CM

## 2020-07-02 NOTE — Addendum Note (Signed)
Addended by: Aliene Altes on: 07/02/2020 12:24 PM   Modules accepted: Orders

## 2020-07-03 ENCOUNTER — Encounter: Payer: 59 | Admitting: Speech Pathology

## 2020-07-09 ENCOUNTER — Other Ambulatory Visit: Payer: Self-pay

## 2020-07-09 ENCOUNTER — Ambulatory Visit: Payer: 59 | Attending: Internal Medicine | Admitting: Speech Pathology

## 2020-07-09 DIAGNOSIS — R482 Apraxia: Secondary | ICD-10-CM | POA: Insufficient documentation

## 2020-07-09 DIAGNOSIS — I6602 Occlusion and stenosis of left middle cerebral artery: Secondary | ICD-10-CM | POA: Diagnosis present

## 2020-07-09 DIAGNOSIS — R4701 Aphasia: Secondary | ICD-10-CM | POA: Insufficient documentation

## 2020-07-09 NOTE — Therapy (Signed)
[place] MAIN [facility] SERVICES [address] [place], [place], [address] Phone: [phone]   Fax:  [phone]  Speech Language Pathology Treatment  Patient Details  Name: Tracey [name] MRN: [id number] Date of Birth: 05/22/1955 Referring Provider (SLP): Emily Filbert, MD   Encounter Date: 07/09/2020   End of Session - 07/09/20 1236     Visit Number 14    Number of Visits 25    Date for SLP Re-Evaluation 07/29/20    Authorization Type [employer] 60 visits for ST max    Authorization Time Period 06/12/20    Authorization - Visit Number 4    Progress Note Due on Visit 10    SLP Start Time 1000    SLP Stop Time  1100    SLP Time Calculation (min) 60 min    Activity Tolerance Patient tolerated treatment well             Past Medical History:  Diagnosis Date   Anxiety    Bronchitis    CAD (coronary artery disease)    a. 04/2008: cariac arrest with prox LAD occ s/p stenting, arctic sun. b. 11/2008: restenosis s/p repeat stenting. c. 01/2011: abnormal stress test but cath reportedly OK.   Dysplastic nevus 11/04/2007   Ant. chest. Slight to moderate atypia, extends to one edge.    Dysplastic nevus 01/03/2009   Left breast. Minimal atypia, close to margin.   Hypothyroid    Myocardial infarction [facility]) 2010   stents and cardiac rehab     Past Surgical History:  Procedure Laterality Date   ANGIOPLASTY  2010   2 stents (femoral)    BUBBLE STUDY  04/23/2020   Procedure: BUBBLE STUDY;  Surgeon: Geralynn Rile, MD;  Location: [place];  Service: Cardiovascular;;   IR CT HEAD LTD  04/18/2020   IR PERCUTANEOUS ART THROMBECTOMY/INFUSION INTRACRANIAL INC DIAG ANGIO  04/18/2020   LOOP RECORDER INSERTION N/A 04/23/2020   Procedure: LOOP RECORDER INSERTION;  Surgeon: Vickie Epley, MD;  Location: [place] CV LAB;  Service: Cardiovascular;  Laterality: N/A;   RADIOLOGY WITH ANESTHESIA N/A 04/18/2020   Procedure: IR WITH ANESTHESIA;   Surgeon: Radiologist, Medication, MD;  Location: [place];  Service: Radiology;  Laterality: N/A;   TEE WITHOUT CARDIOVERSION N/A 04/23/2020   Procedure: TRANSESOPHAGEAL ECHOCARDIOGRAM (TEE);  Surgeon: Geralynn Rile, MD;  Location: [place];  Service: Cardiovascular;  Laterality: N/A;   TUBAL LIGATION      There were no vitals filed for this visit.   Subjective Assessment - 07/09/20 1234     Subjective Missed last week due to possible Covid exposure    Currently in Pain? No/denies                   ADULT SLP TREATMENT - 07/09/20 1234       General Information   Behavior/Cognition Alert;Cooperative    HPI DARIYA GAINER is a 65 y.o. female with a PMHx of CAD s/p MI, cardiac arrest, HTN, hypothryoidism, HLD, and anxiety. Presented to [facility] ED with slurred speech and word finding difficulty. CTA head and neck showed a distal M2 occlusion in left MCA branch. Per chart tPA emergently stopped when increased dysarthria noted> emergent head CT showed no intracranial hemorrhage. Pt transferred to [facility] and underwent revascularization of occluded Lt MCA following left MCA embolism. Seen by SLP during acute stay with diagnosis of global aphasia; however verbal output much improved by d/c on 04/20/20.  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 Pt reviewed the lesson and wrote out her lesson plan (including needed items, modifications (having co-teacher read the scripture), and highlighting her script in the lesson book. Min cues to re-read  her notes or read out loud while writing to reduce written errors. As pt reviewed her lesson, SLP listed apraxic breakdowns with common words in the lesson Tracey [name], Tracey [name], etc) for patient to practice at home. Discussed plan of care; pt would like to hold sessions for one month as she will be helping to care for her husband after surgery. To return week of July 11 and we will re-evaluate ST plan at that time.      Assessment / Recommendations / Plan   Plan Continue with current plan of care      Progression Toward Goals   Progression toward goals Progressing toward goals              SLP Education - 07/09/20 1235     Education Details breakdowns may be more common when stressed or emotional    Person(s) Educated Patient;Child(ren)    Methods Explanation    Comprehension Verbalized understanding              SLP Short Term Goals - 06/12/20 1540       SLP SHORT TERM GOAL #1   Title Patient will complete high-level wordfinding tasks >90% accuracy.    Time 10    Period --   sessions   Status Achieved      SLP SHORT TERM GOAL #2   Title Patient will read multisyllabic words using compensations for apraxia >90% accuracy.    Time 10    Period Weeks    Status Achieved      SLP SHORT TERM GOAL #3   Title Patient will demonstrate comprehension of paragraphs 8-10 sentences by answering questions >90% accuracy.    Time 10    Period --   sessions   Status Achieved      SLP SHORT TERM GOAL #4   Title Patient will use compensations for aphasia and apraxia in 5-8 minutes mod complex conversation and self-correct breakdowns >90% of the time with modified independence.    Time 10    Period --   sessions   Status Achieved              SLP Long Term Goals - 07/09/20 1241       SLP LONG TERM GOAL #1   Title Patient will report use of strategies for aphasia/apraxia to successfully make phone calls related to health, finances, and/or job functions.    Time 12    Period  Weeks    Status On-going      SLP LONG TERM GOAL #2   Title Patient will comprehend work-type emails 100% accuracy with use of compensations if needed (text-to-speech, etc).    Time 12    Period Weeks    Status On-going      SLP LONG TERM GOAL #3   Title Patient will write work-type emails and self-correct >95% of errors  with compensations allowed (speech-to-text, text-to-speech, double-checking).    Time 12    Period Weeks    Status On-going      SLP LONG TERM GOAL #4   Title Patient will use compensations for anomia and apraxia functionally in 20+ minutes complex conversation, repairing breakdowns independently.    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 Speech is grossly functional in mod complex conversation when using slow rate; pt has more breakdowns when emotional or stressed. Reading/writing are more limiting for pt at this time; she is able to use compensations (speech-to-text, text-to-speech) to improve accuracy however requires cues to do so. Patient will be placed on 4 week hold as her husband is having surgery; she will return week of July 11 and we will re-evaluate tx plan at that time. Skilled ST for aphasia and apraxia remains necessary to maximize verbal and written expression as well as reading comprehension for return to PLOF and work/community activities.    Speech Therapy Frequency 2x / week    Duration 12 weeks    Treatment/Interventions Language facilitation;Environmental controls;Cueing hierarchy;SLP instruction and feedback;Functional tasks;Compensatory strategies;Cognitive reorganization;Internal/external aids;Multimodal communcation approach;Patient/family education    Potential to Achieve Goals Good    Potential Considerations Family/community support    SLP Home Exercise Plan see pt  instructions    Consulted and Agree with Plan of Care Patient;Family member/caregiver             Patient will benefit from skilled therapeutic intervention in order to improve the following deficits and impairments:   Aphasia  Verbal apraxia  Middle cerebral artery embolism, left    Problem List Patient Active Problem List   Diagnosis Date Noted   Acute stroke due to ischemia ([place]) 04/18/2020   Middle cerebral artery embolism, left 04/18/2020   Atherosclerosis of native arteries of extremity with intermittent claudication ([place]) 10/05/2019   GERD (gastroesophageal reflux disease) 09/26/2019   Low serum vitamin D 08/25/2018   B12 deficiency 11/21/2015   Acquired hypothyroidism 08/09/2015   Cardiomyopathy, ischemic 08/09/2015   Hyperlipidemia, mixed 08/09/2015   Dizziness 06/13/2012   CAD (coronary artery disease) 06/13/2012   H/O irritable bowel syndrome 10/20/2011   Deneise Lever, [place], CCC-SLP Speech-Language Pathologist  Aliene Altes 07/09/2020, 12:47 PM  [place] MAIN [facility] [address] [place], [place], [address] Phone: [phone]   Fax:  [phone]   Name: Tracey [name] MRN: [id number] Date of Birth: 11-22-1955

## 2020-07-12 ENCOUNTER — Encounter: Payer: 59 | Admitting: Speech Pathology

## 2020-07-16 ENCOUNTER — Encounter: Payer: 59 | Admitting: Speech Pathology

## 2020-07-19 ENCOUNTER — Encounter: Payer: 59 | Admitting: Speech Pathology

## 2020-07-23 ENCOUNTER — Encounter: Payer: 59 | Admitting: Speech Pathology

## 2020-07-23 NOTE — Progress Notes (Signed)
[facility] Summary Report / Loop Recorder 

## 2020-07-24 ENCOUNTER — Other Ambulatory Visit: Payer: Self-pay

## 2020-07-24 NOTE — Patient Outreach (Signed)
[facility] [facility] Care Management  07/24/2020  Tracey [name] Jun 18, 1955 [id number]   First telephone outreach attempt to obtain mRS. No answer. Left message for returned call.  [place] Management Assistant [phone]

## 2020-07-26 ENCOUNTER — Encounter: Payer: 59 | Admitting: Speech Pathology

## 2020-07-26 ENCOUNTER — Telehealth: Payer: Self-pay

## 2020-07-26 MED ORDER — CLOPIDOGREL BISULFATE 75 MG PO TABS: 75.0000 mg | ORAL_TABLET | Freq: Every day | ORAL | 3 refills | Status: DC

## 2020-07-26 NOTE — Telephone Encounter (Signed)
Pt calling requesting a refill on clopidogrel. This medication has expired off of pt's medication list. Would Dr. Acie Fredrickson like to reorder this medication for the pt? Please address

## 2020-07-26 NOTE — Telephone Encounter (Signed)
Per review of Dr. Acie Fredrickson and neurology note-Pt was to continue on plavix 75 mg daily.  Medication sent to pharmacy as requested.

## 2020-07-27 ENCOUNTER — Other Ambulatory Visit: Payer: Self-pay

## 2020-07-27 NOTE — Patient Outreach (Signed)
[place] [facility]) Care Management  07/27/2020  Tracey [name] 1955-02-13 [id number]   Telephone outreach to patient to obtain mRS was successfully completed. MRS= 1   [place] Care Management Assistant

## 2020-08-01 ENCOUNTER — Encounter: Payer: 59 | Admitting: Speech Pathology

## 2020-08-05 LAB — CUP PACEART REMOTE DEVICE CHECK
Date Time Interrogation Session: 20220705163014
Implantable Pulse Generator Implant Date: 20220328

## 2020-08-06 ENCOUNTER — Encounter: Payer: 59 | Admitting: Speech Pathology

## 2020-08-06 ENCOUNTER — Ambulatory Visit (INDEPENDENT_AMBULATORY_CARE_PROVIDER_SITE_OTHER): Payer: 59

## 2020-08-06 ENCOUNTER — Ambulatory Visit: Payer: 59 | Admitting: Dermatology

## 2020-08-06 DIAGNOSIS — I255 Ischemic cardiomyopathy: Secondary | ICD-10-CM | POA: Diagnosis not present

## 2020-08-08 ENCOUNTER — Ambulatory Visit: Payer: 59 | Admitting: Speech Pathology

## 2020-08-09 ENCOUNTER — Other Ambulatory Visit: Payer: Self-pay

## 2020-08-09 ENCOUNTER — Ambulatory Visit: Payer: 59 | Attending: Internal Medicine | Admitting: Speech Pathology

## 2020-08-09 DIAGNOSIS — R4701 Aphasia: Secondary | ICD-10-CM | POA: Diagnosis present

## 2020-08-09 DIAGNOSIS — I6602 Occlusion and stenosis of left middle cerebral artery: Secondary | ICD-10-CM | POA: Diagnosis present

## 2020-08-09 DIAGNOSIS — R482 Apraxia: Secondary | ICD-10-CM | POA: Insufficient documentation

## 2020-08-09 NOTE — Therapy (Signed)
[place] MAIN [facility] SERVICES [address] [place], [place], [address] Phone: [phone]   Fax:  [phone]  Speech Language Pathology Treatment and Recertification  Patient Details  Name: Tracey [name] MRN: [id number] Date of Birth: 19-Jun-1955 Referring Provider (SLP): Emily Filbert, MD   Encounter Date: 08/09/2020   End of Session - 08/09/20 1131     Visit Number 15    Number of Visits 27    Date for SLP Re-Evaluation 11/07/20    Authorization - Visit Number 5    Progress Note Due on Visit 10    SLP Start Time 0900    SLP Stop Time  1000    SLP Time Calculation (min) 60 min    Activity Tolerance Patient tolerated treatment well             Past Medical History:  Diagnosis Date   Anxiety    Bronchitis    CAD (coronary artery disease)    a. 04/2008: cariac arrest with prox LAD occ s/p stenting, arctic sun. b. 11/2008: restenosis s/p repeat stenting. c. 01/2011: abnormal stress test but cath reportedly OK.   Dysplastic nevus 11/04/2007   Ant. chest. Slight to moderate atypia, extends to one edge.    Dysplastic nevus 01/03/2009   Left breast. Minimal atypia, close to margin.   Hypothyroid    Myocardial infarction [facility]) 2010   stents and cardiac rehab     Past Surgical History:  Procedure Laterality Date   ANGIOPLASTY  2010   2 stents (femoral)    BUBBLE STUDY  04/23/2020   Procedure: BUBBLE STUDY;  Surgeon: Geralynn Rile, MD;  Location: [place];  Service: Cardiovascular;;   IR CT HEAD LTD  04/18/2020   IR PERCUTANEOUS ART THROMBECTOMY/INFUSION INTRACRANIAL INC DIAG ANGIO  04/18/2020   LOOP RECORDER INSERTION N/A 04/23/2020   Procedure: LOOP RECORDER INSERTION;  Surgeon: Vickie Epley, MD;  Location: [place] CV LAB;  Service: Cardiovascular;  Laterality: N/A;   RADIOLOGY WITH ANESTHESIA N/A 04/18/2020   Procedure: IR WITH ANESTHESIA;  Surgeon: Radiologist, Medication, MD;  Location: [place];  Service:  Radiology;  Laterality: N/A;   TEE WITHOUT CARDIOVERSION N/A 04/23/2020   Procedure: TRANSESOPHAGEAL ECHOCARDIOGRAM (TEE);  Surgeon: Geralynn Rile, MD;  Location: [place];  Service: Cardiovascular;  Laterality: N/A;   TUBAL LIGATION      There were no vitals filed for this visit.   Subjective Assessment - 08/09/20 1108     Subjective Reports husband's surgery went well.    Currently in Pain? No/denies                   ADULT SLP TREATMENT - 08/09/20 1108       General Information   Behavior/Cognition Alert;Cooperative    HPI Tracey [name] is a 65 y.o. female with a PMHx of CAD s/p MI, cardiac arrest, HTN, hypothryoidism, HLD, and anxiety. Presented to [facility] ED with slurred speech and word finding difficulty. CTA head and neck showed a distal M2 occlusion in left MCA branch. Per chart tPA emergently stopped when increased dysarthria noted> emergent head CT showed no intracranial hemorrhage. Pt transferred to [facility] and underwent revascularization of occluded Lt MCA following left MCA embolism. Seen by SLP during acute stay with diagnosis of global aphasia; however verbal output much improved by d/c on 04/20/20.      Treatment Provided   Treatment provided Cognitive-Linquistic      Pain Assessment  Pain Assessment No/denies pain      Cognitive-Linquistic Treatment   Treatment focused on Apraxia;Aphasia;Patient/family/caregiver education    Skilled Treatment Mod complex conversation re: her activities this past month with occasional min cues to slow rate. She is considering return to work part time next month; feels her work will make adjustments/accommodations and allow her to complete duties as able. 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 She is not sure yet whether she wants to continue this. 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 Pt reports at times she does very well with her speech, but at other times she has more difficulty. Education regarding managing energy/fatigue using rest breaks and by asking questions during conversation to reduce expressive language burden.      Assessment / Recommendations / Plan   Plan Continue with current plan of care      Progression Toward Goals   Progression toward goals Progressing toward goals              SLP Education - 08/09/20 1131     Education Details energy conservation    Person(s) Educated Patient    Methods Explanation    Comprehension Verbalized understanding              SLP Short Term Goals - 08/09/20 1134       SLP SHORT TERM GOAL #1   Title Patient will complete high-level wordfinding tasks >90% accuracy.    Time 10    Period --   sessions   Status Achieved      SLP SHORT TERM GOAL #2   Title Patient will read multisyllabic words using compensations for apraxia >90% accuracy.    Time 10    Period Weeks    Status Achieved      SLP SHORT TERM GOAL #3   Title Patient will demonstrate comprehension of paragraphs 8-10 sentences by answering questions >90% accuracy.    Time 10    Period --   sessions   Status Achieved      SLP SHORT TERM GOAL #4   Title Patient will use compensations for aphasia and apraxia in 5-8 minutes mod complex conversation and self-correct breakdowns >90% of the time with modified independence.    Time 10    Period --   sessions   Status Achieved              SLP Long Term Goals - 08/09/20 1134       SLP LONG TERM GOAL #1   Title Patient will report use of strategies for aphasia/apraxia to successfully make phone calls related to health, finances, and/or job functions.    Time 12    Period Weeks    Status On-going    Target Date 09/20/20   continue all LTGs     SLP LONG TERM GOAL #2   Title Patient will comprehend  work-type emails 100% accuracy with use of compensations if needed (text-to-speech, etc).    Time 12    Period Weeks    Status On-going    Target Date 09/20/20      SLP LONG TERM GOAL #3   Title Patient will write work-type emails and self-correct >95% of errors with compensations allowed (speech-to-text, text-to-speech, double-checking).    Time 12  Period Weeks    Status On-going    Target Date 09/20/20      SLP LONG TERM GOAL #4   Title Patient will use compensations for anomia and apraxia functionally in 20+ minutes complex conversation, repairing breakdowns independently.    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 Speech is grossly functional in mod complex conversation when using slow rate; pt has more breakdowns when emotional or stressed. Reading/writing are more limiting for pt at this time; she is able to use compensations (speech-to-text, text-to-speech) to improve accuracy however requires cues to do so. Patient is making progress toward goals and would like to continue ST at frequency of 1x per week. Skilled ST for aphasia and apraxia remains necessary to maximize verbal and written expression as well as reading comprehension for return to PLOF and work/community activities.    Speech Therapy Frequency 1x /week    Duration 12 weeks    Treatment/Interventions Language facilitation;Environmental controls;Cueing hierarchy;SLP instruction and feedback;Functional tasks;Compensatory strategies;Cognitive reorganization;Internal/external aids;Multimodal communcation approach;Patient/family education    Potential to Achieve Goals Good    Potential Considerations Family/community support    SLP Home Exercise Plan see pt instructions    Consulted and Agree with Plan of Care  Patient;Family member/caregiver             Patient will benefit from skilled therapeutic intervention in order to improve the following deficits and impairments:   Aphasia  Verbal apraxia  Middle cerebral artery embolism, left    Problem List Patient Active Problem List   Diagnosis Date Noted   Acute stroke due to ischemia ([place]) 04/18/2020   Middle cerebral artery embolism, left 04/18/2020   Atherosclerosis of native arteries of extremity with intermittent claudication ([place]) 10/05/2019   GERD (gastroesophageal reflux disease) 09/26/2019   Low serum vitamin D 08/25/2018   B12 deficiency 11/21/2015   Acquired hypothyroidism 08/09/2015   Cardiomyopathy, ischemic 08/09/2015   Hyperlipidemia, mixed 08/09/2015   Dizziness 06/13/2012   CAD (coronary artery disease) 06/13/2012   H/O irritable bowel syndrome 10/20/2011   Deneise Lever, [place], CCC-SLP Speech-Language Pathologist  Aliene Altes 08/09/2020, 11:37 AM  [place] MAIN [facility] SERVICES [address] [place], [place], [address] Phone: [phone]   Fax:  [phone]   Name: Tracey [name] MRN: [id number] Date of Birth: 01/23/1956

## 2020-08-13 ENCOUNTER — Telehealth: Payer: Self-pay

## 2020-08-13 ENCOUNTER — Encounter: Payer: 59 | Admitting: Speech Pathology

## 2020-08-13 NOTE — Telephone Encounter (Signed)
Pt calling; called a couple wks ago to ask for referral to a urologist in [place]; [facility] referred her to a doctor in [place] back in Feb but pt had a stroke before she was seen.  Has since moved all her care to [facility] as she doesn't want to travel to [facility]; is willing to travel to [place] or [place].  [phone]

## 2020-08-14 ENCOUNTER — Ambulatory Visit: Payer: 59 | Admitting: Dermatology

## 2020-08-15 ENCOUNTER — Ambulatory Visit: Payer: 59 | Admitting: Speech Pathology

## 2020-08-15 ENCOUNTER — Other Ambulatory Visit: Payer: Self-pay

## 2020-08-15 ENCOUNTER — Telehealth: Payer: Self-pay | Admitting: Adult Health

## 2020-08-15 DIAGNOSIS — R4701 Aphasia: Secondary | ICD-10-CM | POA: Diagnosis not present

## 2020-08-15 DIAGNOSIS — R482 Apraxia: Secondary | ICD-10-CM

## 2020-08-15 DIAGNOSIS — I6602 Occlusion and stenosis of left middle cerebral artery: Secondary | ICD-10-CM

## 2020-08-15 NOTE — Telephone Encounter (Signed)
Pt states she is ready to go back to work but she is still having headaches and they do not seem to be going away.  Pt is not feeling well and now states sometimes she is not able to see well.  Please call pt

## 2020-08-15 NOTE — Telephone Encounter (Signed)
I called the pt back and we discussed message. Pt sts h/a have not improved since starting the topamax. Pt wanted to know if we could move her 3 month f/u up a week to 08/29/20 to meet with the provider. I was able to move appt up, but asked if I could speak with provider about upping the dosage of the topamax to see if it would help in interim. Pt declined and sts she will discuss at [place].

## 2020-08-17 NOTE — Therapy (Signed)
[place] MAIN [facility] SERVICES [address] [place], [place], [address] Phone: [phone]   Fax:  [phone]  Speech Language Pathology Treatment  Patient Details  Name: Tracey [name] MRN: [id number] Date of Birth: 07-12-1955 Referring Provider (SLP): Emily Filbert, MD   Encounter Date: 08/15/2020   End of Session - 08/17/20 0918     Visit Number 16    Number of Visits 27    Date for SLP Re-Evaluation 11/07/20    Authorization Type AET[employer] 49 visits for ST max    Authorization Time Period 06/12/20    Authorization - Visit Number 6    Progress Note Due on Visit 10    SLP Start Time 1000    SLP Stop Time  1100    SLP Time Calculation (min) 60 min    Activity Tolerance Patient tolerated treatment well             Past Medical History:  Diagnosis Date   Anxiety    Bronchitis    CAD (coronary artery disease)    a. 04/2008: cariac arrest with prox LAD occ s/p stenting, arctic sun. b. 11/2008: restenosis s/p repeat stenting. c. 01/2011: abnormal stress test but cath reportedly OK.   Dysplastic nevus 11/04/2007   Ant. chest. Slight to moderate atypia, extends to one edge.    Dysplastic nevus 01/03/2009   Left breast. Minimal atypia, close to margin.   Hypothyroid    Myocardial infarction [facility]) 2010   stents and cardiac rehab     Past Surgical History:  Procedure Laterality Date   ANGIOPLASTY  2010   2 stents (femoral)    BUBBLE STUDY  04/23/2020   Procedure: BUBBLE STUDY;  Surgeon: Geralynn Rile, MD;  Location: [place];  Service: Cardiovascular;;   IR CT HEAD LTD  04/18/2020   IR PERCUTANEOUS ART THROMBECTOMY/INFUSION INTRACRANIAL INC DIAG ANGIO  04/18/2020   LOOP RECORDER INSERTION N/A 04/23/2020   Procedure: LOOP RECORDER INSERTION;  Surgeon: Vickie Epley, MD;  Location: [place] CV LAB;  Service: Cardiovascular;  Laterality: N/A;   RADIOLOGY WITH ANESTHESIA N/A 04/18/2020   Procedure: IR WITH ANESTHESIA;   Surgeon: Radiologist, Medication, MD;  Location: [place];  Service: Radiology;  Laterality: N/A;   TEE WITHOUT CARDIOVERSION N/A 04/23/2020   Procedure: TRANSESOPHAGEAL ECHOCARDIOGRAM (TEE);  Surgeon: Geralynn Rile, MD;  Location: [place];  Service: Cardiovascular;  Laterality: N/A;   TUBAL LIGATION      There were no vitals filed for this visit.   Subjective Assessment - 08/17/20 0914     Subjective pt pleasant, accompanied by her daughter, telling funny story about church this past Sunday    Patient is accompained by: Family member    Currently in Pain? No/denies                   ADULT SLP TREATMENT - 08/17/20 0001       General Information   Behavior/Cognition Alert;Cooperative    HPI Tracey [name] is a 65 y.o. female with a PMHx of CAD s/p MI, cardiac arrest, HTN, hypothryoidism, HLD, and anxiety. Presented to [facility] ED with slurred speech and word finding difficulty. CTA head and neck showed a distal M2 occlusion in left MCA branch. Per chart tPA emergently stopped when increased dysarthria noted> emergent head CT showed no intracranial hemorrhage. Pt transferred to [facility] and underwent revascularization of occluded Lt MCA following left MCA embolism. Seen by SLP during acute stay with  diagnosis of global aphasia; however verbal output much improved by d/c on 04/20/20.      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              SLP Education - 08/17/20 [phone]     Education Details how to create a lesson plan and practice flow of lesson as well as target  words within lesson    Person(s) Educated Patient;Child(ren)    Methods Explanation;Demonstration;Verbal cues;Handout    Comprehension Verbalized understanding              SLP Short Term Goals - 08/09/20 1134       SLP SHORT TERM GOAL #1   Title Patient will complete high-level wordfinding tasks >90% accuracy.    Time 10    Period --   sessions   Status Achieved      SLP SHORT TERM GOAL #2   Title Patient will read multisyllabic words using compensations for apraxia >90% accuracy.    Time 10    Period Weeks    Status Achieved      SLP SHORT TERM GOAL #3   Title Patient will demonstrate comprehension of paragraphs 8-10 sentences by answering questions >90% accuracy.    Time 10    Period --   sessions   Status Achieved      SLP SHORT TERM GOAL #4   Title Patient will use compensations for aphasia and apraxia in 5-8 minutes mod complex conversation and self-correct breakdowns >90% of the time with modified independence.    Time 10    Period --   sessions   Status Achieved              SLP Long Term Goals - 08/09/20 1134       SLP LONG TERM GOAL #1   Title Patient will report use of strategies for aphasia/apraxia to successfully make phone calls related to health, finances, and/or job functions.    Time 12    Period Weeks    Status On-going    Target Date 09/20/20   continue all LTGs     SLP LONG TERM GOAL #2   Title Patient will comprehend work-type emails 100% accuracy with use of compensations if needed (text-to-speech, etc).    Time 12    Period Weeks    Status On-going    Target Date 09/20/20      SLP LONG TERM GOAL #3   Title Patient will write work-type emails and self-correct >95% of errors with compensations allowed (speech-to-text, text-to-speech, double-checking).    Time 12    Period Weeks    Status On-going    Target Date 09/20/20      SLP LONG TERM GOAL #4   Title Patient will use compensations for anomia and apraxia functionally in 20+  minutes complex conversation, repairing breakdowns independently.    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 Speech is grossly functional in semi-complex conversation when using slow rate; pt has more breakdowns when emotional or stressed. Pt responded well to instructions as well as SLP example of creating a lesson plan from the printed Bible lession. Patient is making progress toward goals and would like to continue ST at frequency of 1x per week. Skilled ST for aphasia and apraxia remains necessary to maximize verbal and written expression as well as reading comprehension for return to PLOF and work/community activities.    Speech Therapy Frequency 1x /week    Duration 12 weeks    Treatment/Interventions Language facilitation;Environmental controls;Cueing hierarchy;SLP instruction and feedback;Functional tasks;Compensatory strategies;Cognitive reorganization;Internal/external aids;Multimodal communcation approach;Patient/family education    Potential to Achieve Goals Good    SLP Home Exercise Plan see pt instructions    Consulted and Agree with Plan of Care Patient;Family member/caregiver    Family Member Consulted pt's daughter             Patient will benefit from skilled therapeutic intervention in order to improve the following deficits and impairments:   Aphasia  Apraxia  Middle cerebral artery embolism, left    Problem List Patient Active Problem List   Diagnosis Date Noted   Acute stroke due to ischemia ([place]) 04/18/2020   Middle cerebral artery embolism, left 04/18/2020   Atherosclerosis of native arteries of extremity with intermittent claudication ([place]) 10/05/2019   GERD (gastroesophageal reflux disease) 09/26/2019   Low serum vitamin D 08/25/2018   B12 deficiency  11/21/2015   Acquired hypothyroidism 08/09/2015   Cardiomyopathy, ischemic 08/09/2015   Hyperlipidemia, mixed 08/09/2015   Dizziness 06/13/2012   CAD (coronary artery disease) 06/13/2012   H/O irritable bowel syndrome 10/20/2011   Brittish Bolinger B. Rutherford Nail M.S., CCC-SLP, [place] Pathologist Rehabilitation Services Office [phone]  Stormy Fabian 08/17/2020, 9:23 AM  [place] MAIN [facility] SERVICES [address] [place], [place], [address] Phone: [phone]   Fax:  [phone]   Name: KASHVI OSUCH MRN: [id number] Date of Birth: Feb 19, 1955

## 2020-08-17 NOTE — Patient Instructions (Signed)
Practice verbally rehearsing her lesson plan

## 2020-08-20 ENCOUNTER — Encounter: Payer: 59 | Admitting: Speech Pathology

## 2020-08-22 ENCOUNTER — Ambulatory Visit: Payer: 59 | Admitting: Speech Pathology

## 2020-08-22 ENCOUNTER — Other Ambulatory Visit: Payer: Self-pay

## 2020-08-22 DIAGNOSIS — I6602 Occlusion and stenosis of left middle cerebral artery: Secondary | ICD-10-CM

## 2020-08-22 DIAGNOSIS — R4701 Aphasia: Secondary | ICD-10-CM | POA: Diagnosis not present

## 2020-08-22 DIAGNOSIS — R482 Apraxia: Secondary | ICD-10-CM

## 2020-08-22 NOTE — Therapy (Signed)
[place] MAIN [facility] SERVICES [address] [place], [place], [address] Phone: [phone]   Fax:  [phone]  Speech Language Pathology Treatment  Patient Details  Name: Tracey [name] MRN: [id number] Date of Birth: 1955/04/08 Referring Provider (SLP): Emily Filbert, MD   Encounter Date: 08/22/2020   End of Session - 08/22/20 1555     Visit Number 17    Number of Visits 27    Date for SLP Re-Evaluation 11/07/20    Authorization Type AETNA 60 visits for ST max    Authorization - Visit Number 7    Progress Note Due on Visit 10    SLP Start Time 1000    SLP Stop Time  1100    SLP Time Calculation (min) 60 min    Activity Tolerance Patient tolerated treatment well             Past Medical History:  Diagnosis Date   Anxiety    Bronchitis    CAD (coronary artery disease)    a. 04/2008: cariac arrest with prox LAD occ s/p stenting, arctic sun. b. 11/2008: restenosis s/p repeat stenting. c. 01/2011: abnormal stress test but cath reportedly OK.   Dysplastic nevus 11/04/2007   Ant. chest. Slight to moderate atypia, extends to one edge.    Dysplastic nevus 01/03/2009   Left breast. Minimal atypia, close to margin.   Hypothyroid    Myocardial infarction [facility]) 2010   stents and cardiac rehab     Past Surgical History:  Procedure Laterality Date   ANGIOPLASTY  2010   2 stents (femoral)    BUBBLE STUDY  04/23/2020   Procedure: BUBBLE STUDY;  Surgeon: Geralynn Rile, MD;  Location: [place];  Service: Cardiovascular;;   IR CT HEAD LTD  04/18/2020   IR PERCUTANEOUS ART THROMBECTOMY/INFUSION INTRACRANIAL INC DIAG ANGIO  04/18/2020   LOOP RECORDER INSERTION N/A 04/23/2020   Procedure: LOOP RECORDER INSERTION;  Surgeon: Vickie Epley, MD;  Location: [place] CV LAB;  Service: Cardiovascular;  Laterality: N/A;   RADIOLOGY WITH ANESTHESIA N/A 04/18/2020   Procedure: IR WITH ANESTHESIA;  Surgeon: Radiologist, Medication, MD;   Location: [place];  Service: Radiology;  Laterality: N/A;   TEE WITHOUT CARDIOVERSION N/A 04/23/2020   Procedure: TRANSESOPHAGEAL ECHOCARDIOGRAM (TEE);  Surgeon: Geralynn Rile, MD;  Location: [place];  Service: Cardiovascular;  Laterality: N/A;   TUBAL LIGATION      There were no vitals filed for this visit.   Subjective Assessment - 08/22/20 1542     Subjective "I just get too tired."                   ADULT SLP TREATMENT - 08/22/20 1543       General Information   Behavior/Cognition Alert;Cooperative    HPI Tracey [name] is a 65 y.o. female with a PMHx of CAD s/p MI, cardiac arrest, HTN, hypothryoidism, HLD, and anxiety. Presented to [facility] ED with slurred speech and word finding difficulty. CTA head and neck showed a distal M2 occlusion in left MCA branch. Per chart tPA emergently stopped when increased dysarthria noted> emergent head CT showed no intracranial hemorrhage. Pt transferred to [facility] and underwent revascularization of occluded Lt MCA following left MCA embolism. Seen by SLP during acute stay with diagnosis of global aphasia; however verbal output much improved by d/c on 04/20/20.      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 She had trouble speaking by the end. She has decided to just assist instead of leading the class for now. Reports she is feeling anxious about whether she should return to work or not. Discussed with pt at length how aphasia/apraxia may affect her in the workplace. She is needing to take a nap most days, particularly if she has high language demands that day: "Speech days are exhausting." Pt feels her workplace could be receptive to accommodations for her, but is  unsure of how long they would allow this. Pt agreed that she has difficulty communicating under time pressure, which would make it difficult for her to field phone calls. When drafting typed/written responses pt requires extended time for sentence formulation and to correct written paraphasias,  (reports 1/2 hour to draft Facebook post). Pt agreed she would need extended time to read and respond to emails. Language demands also present in tasks such as communicating about/filling out purchase orders. Near end of session, pt reported she has been feeling more "confusion" and having difficulty focusing. She states this was relatively sudden onset when she was out of town earlier this month. She had a headache and reports noise sensitivity, dizziness/balance difficulties, trouble focusing, increased fatigue since that time. She has follow-up with neurology next week; pt began working on a list of questions to ask her provider. Educated on signs of stroke.      Progression Toward Goals   Progression toward goals Progressing toward goals              SLP Education - 08/22/20 1555     Education Details potential accommodations for the workplace; pt would need significant modification of duties/schedule due to fatigue and language demands    Person(s) Educated Patient;Child(ren)    Methods Explanation    Comprehension Verbalized understanding;Need further instruction              SLP Short Term Goals - 08/09/20 1134       SLP SHORT TERM GOAL #1   Title Patient will complete high-level wordfinding tasks >90% accuracy.    Time 10    Period --   sessions   Status Achieved      SLP SHORT TERM GOAL #2   Title Patient will read multisyllabic words using compensations for apraxia >90% accuracy.    Time 10    Period Weeks    Status Achieved      SLP SHORT TERM GOAL #3   Title Patient will demonstrate comprehension of paragraphs 8-10 sentences by answering questions >90% accuracy.    Time 10     Period --   sessions   Status Achieved      SLP SHORT TERM GOAL #4   Title Patient will use compensations for aphasia and apraxia in 5-8 minutes mod complex conversation and self-correct breakdowns >90% of the time with modified independence.    Time 10    Period --   sessions   Status Achieved              SLP Long Term Goals - 08/09/20 1134       SLP LONG TERM GOAL #1   Title Patient will report use of strategies for aphasia/apraxia to successfully make phone calls related to health, finances, and/or job functions.    Time  12    Period Weeks    Status On-going    Target Date 09/20/20   continue all LTGs     SLP LONG TERM GOAL #2   Title Patient will comprehend work-type emails 100% accuracy with use of compensations if needed (text-to-speech, etc).    Time 12    Period Weeks    Status On-going    Target Date 09/20/20      SLP LONG TERM GOAL #3   Title Patient will write work-type emails and self-correct >95% of errors with compensations allowed (speech-to-text, text-to-speech, double-checking).    Time 12    Period Weeks    Status On-going    Target Date 09/20/20      SLP LONG TERM GOAL #4   Title Patient will use compensations for anomia and apraxia functionally in 20+ minutes complex conversation, repairing breakdowns independently.    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 Speech is grossly functional in semi-complex conversation when using slow rate; pt has more breakdowns when emotional, stressed or fatigued. Written expression/reading comprehension impaired for mod complex-complex material; patient requires extended time in tasks for sentence formulation and correction of written paraphasias. Patient has been hopeful to return to work but states she is worried  about how her speech/language difficulties will impact her. Anticipate that if she is able to return in some capacity, she would require modified schedule as well as accommodations such as reduced workload, reduced language demands (such as scanning/clerical activities vs making/receiving phone calls or writing emails). Pt reports onset of difficulty focusing, dizziness in the last few weeks; encouraged her to discuss this with neurologist at upcoming appointment. Patient is making progress toward goals and would like to continue ST at frequency of 1x per week. Skilled ST for aphasia and apraxia remains necessary to maximize verbal and written expression as well as reading comprehension for return to PLOF and work/community activities.    Speech Therapy Frequency 1x /week    Duration 12 weeks    Treatment/Interventions Language facilitation;Environmental controls;Cueing hierarchy;SLP instruction and feedback;Functional tasks;Compensatory strategies;Cognitive reorganization;Internal/external aids;Multimodal communcation approach;Patient/family education    Potential to Achieve Goals Good    SLP Home Exercise Plan see pt instructions    Consulted and Agree with Plan of Care Patient;Family member/caregiver    Family Member Consulted pt's daughter             Patient will benefit from skilled therapeutic intervention in order to improve the following deficits and impairments:   Aphasia  Apraxia  Middle cerebral artery embolism, left    Problem List Patient Active Problem List   Diagnosis Date Noted   Acute stroke due to ischemia ([place]) 04/18/2020   Middle cerebral artery embolism, left 04/18/2020   Atherosclerosis of native arteries of extremity with intermittent claudication ([place]) 10/05/2019   GERD (gastroesophageal reflux disease) 09/26/2019   Low serum vitamin D 08/25/2018   B12 deficiency 11/21/2015   Acquired hypothyroidism 08/09/2015   Cardiomyopathy, ischemic 08/09/2015    Hyperlipidemia, mixed 08/09/2015   Dizziness 06/13/2012   CAD (coronary artery disease) 06/13/2012   H/O irritable bowel syndrome 10/20/2011   Deneise Lever, [place], CCC-SLP Speech-Language Pathologist  Aliene Altes 08/22/2020, 4:02  PM  [place] MAIN [facility] SERVICES [address] [place], [place], [address] Phone: [phone]   Fax:  [phone]   Name: Tracey [name] MRN: [id number] Date of Birth: [birth year]

## 2020-08-29 ENCOUNTER — Ambulatory Visit (INDEPENDENT_AMBULATORY_CARE_PROVIDER_SITE_OTHER): Payer: 59 | Admitting: Adult Health

## 2020-08-29 ENCOUNTER — Ambulatory Visit: Payer: Self-pay | Admitting: Adult Health

## 2020-08-29 ENCOUNTER — Ambulatory Visit: Payer: 59 | Admitting: Speech Pathology

## 2020-08-29 ENCOUNTER — Other Ambulatory Visit: Payer: Self-pay

## 2020-08-29 ENCOUNTER — Telehealth: Payer: Self-pay

## 2020-08-29 ENCOUNTER — Encounter: Payer: Self-pay | Admitting: Adult Health

## 2020-08-29 VITALS — BP 122/64 | HR 70 | Ht 65.0 in | Wt 211.2 lb

## 2020-08-29 DIAGNOSIS — E785 Hyperlipidemia, unspecified: Secondary | ICD-10-CM

## 2020-08-29 DIAGNOSIS — I10 Essential (primary) hypertension: Secondary | ICD-10-CM | POA: Diagnosis not present

## 2020-08-29 DIAGNOSIS — I69320 Aphasia following cerebral infarction: Secondary | ICD-10-CM

## 2020-08-29 DIAGNOSIS — R519 Headache, unspecified: Secondary | ICD-10-CM | POA: Diagnosis not present

## 2020-08-29 DIAGNOSIS — M542 Cervicalgia: Secondary | ICD-10-CM

## 2020-08-29 DIAGNOSIS — I63512 Cerebral infarction due to unspecified occlusion or stenosis of left middle cerebral artery: Secondary | ICD-10-CM

## 2020-08-29 NOTE — Patient Instructions (Addendum)
Please stop topamax at this time to see if this helps improve focusing and dizziness - may consider starting amitriptyline next week which can help with both headaches and anxiety - we can further discuss this next week   Referral will be placed to start dry needling to further help neck pain and headaches  Continue working with speech therapy  Continue clopidogrel 75 mg daily  and Crestor  for secondary stroke prevention  Continue to follow up with PCP regarding cholesterol and blood pressure management  Maintain strict control of hypertension with blood pressure goal below 130/90 and cholesterol with LDL cholesterol (bad cholesterol) goal below 70 mg/dL.       Followup in the future with me in 3 months or call earlier if needed       Thank you for coming to see [place] at [facility]. I hope we have been able to provide you high quality care today.  You may receive a patient satisfaction survey over the next few weeks. We would appreciate your feedback and comments so that we may continue to improve ourselves and the health of our patients.

## 2020-08-29 NOTE — Telephone Encounter (Signed)
Tracey [name] is a 65 y.o. female was called and contacted re: New pt Pre appt call to collect history information. Pt verified using 2 identifiers. Confirmation that I am speaking with the correct person.  Chart was updated: -Allergy -Medication -Confirm pharmacy -OB history  Pt was notified to arrive 15 min early and we will need a urine sample when she arrives. Pt verbalized understanding.

## 2020-08-29 NOTE — Progress Notes (Signed)
[name] [address] [place]. [place] [address] [phone]       STROKE FOLLOW UP NOTE  [name] Date of Birth:  [birth year] Medical Record Number:  [id number]   Reason for Referral: stroke follow up    SUBJECTIVE:   CHIEF COMPLAINT:  Chief Complaint  Patient presents with   Follow-up    Rm 3 with daughter Tracey [name]. Reports h/a and trouble focusing are still an issue for her. Reports h/a are present daily severity can fluctuate. Reports her vision can be affected by her h/a along with noise sensitivity.     HPI:   Today, 08/29/2020, Tracey [name] returns for 21-monthstroke follow-up accompanied by her daughter, KMaudie [name]  Greatest concern today is in regards to continued daily headaches and dizziness.  Continue daily headaches fluctuating in severity typically left parietal region and right occipital.  She does endorse neck tension.  At times can be associated with photophobia and phonophobia.  Can worsen with increased stressors.  Reports dizziness present since her stroke but seem to worsen shortly after when she was switched from Celexa to Lexapro and more recently after starting Topamax as well as increased difficulty focusing and brain fog sensation.  Hx of ocular migraines possibly occurring 3 [place]  [facility] for residual aphasia and apraxia with some improvement noted but can worsen with increased stress, emotion or fatigue.  Increased fatigue noted after high language demands.  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  Compliant on Plavix and Crestor without associated side effects.  Blood pressure today 122/64.  Loop recorder has not shown atrial fibrillation thus far.  No further concerns at this time.    History provided  for reference purposes only Initial visit 05/28/2020 JM: Tracey [name] being seen for hospital follow-up accompanied by her husband, RAudry [name]   Reports continued speech difficulty including difficulty with reading and writing but has been making progress.  She is currently on short-term disability provided by Dr. XErlinda Hongduring hospitalization and requests extension.  Previously working as an aOptometrist  She is also been experiencing daily headaches appear more tension related to have been present since her stroke.  She does have remote history of migraines without any recent migraine headaches and current headaches different from prior migraines.  She has been using Tylenol daily without much benefit.  Denies new stroke/TIA symptoms  Completed 3 weeks DAPT and remains on Plavix alone without associated side effects Remains on Crestor without associated side effects Blood pressure today 136/66 Loop recorder has not shown atrial fibrillation thus far  No further concerns at this time  Stroke admission 04/18/2020 Ms. Tracey HODSONis a 65y.o. female with history of CAD s/p MI and cardiac arrest with stentins 04/2008, restenting 11/2018, claudication, HTN, hypothryoidism, HLD, and anxiety who presented on 04/18/2020 with aphasia. She was at work and seem to go into the bathroom at 9:15 AM.  Later around 9:20 AM she was found on the floor aphasic and with some right-sided weakness.  [facility] staff thought that her speech worsened so tPA stopped. [facility]  Stroke work-up revealed left MCA M2 branch stroke secondary  to large vessel disease vs cardioembolic source.  Underwent successful revascularization by IR for M2 occlusion.  Loop recorder placed.  LDL 51 on Crestor 40 mg daily.  HTN stable.  Other stroke risk factors include former tobacco use stopped after MI, family history of stroke and CAD with MI in 2010.   Recommended DAPT for 3 weeks and Plavix alone.  Residual aphasia and alexia.  Evaluated by therapies who recommended outpatient PT/SLP and discharged home in stable condition.   Stroke - Left MCA M2 branch stroke secondary to large vessel disease vs. cardioembolic source CT head Code Stroke - Small amount of white matter hypodensity within the posterior left frontal lobe. In the setting of an acute distal left M2 MCA branch occlusion CT head - No evidence of acute intracranial hemorrhage status post tPA. No demarcated cortical infarct. As before, there is subtle hypodensity within the posterior left frontal lobe white matter. In the setting of an acute distal left M2 MCA branch occlusion CTA head & neck - Occluded distal M2 left MCA branch vessel. Mild non-stenotic calcified plaque within the intracranial ICAs bilaterally. CT perfusion - The perfusion software identifies a 16 mL region of hypoperfusion within the posterior left MCA territory utilizing the Tmax>6 seconds threshold. The perfusion software identifies no core infarct. Mismatch volume: 16 mL. MRI moderate size acute infarction in left parietal lobe with minimal additional small acute left parietal cortical infarcts.  No evidence of hemorrhagic transformation.  Mild chronic microvascular ischemic changes MRA head preserved but decreased flow related enhancement at the site of distal left M2/M3 MCA thrombus on prior CTA 2D Echo - EF:45-50%. No wall motion abnormalities. Mid to apical anteroseptal akinesis. DVT US - No evidence of DVT bilaterally TEE interatrial septum aneurysmal.  Bubble study positive consistent with small to moderate sized PFO; no evidence of thrombus Loop recorder placed LDL 51 HgbA1c 5.4 aspirin 81 mg daily prior to admission, now on aspirin 81 mg daily and clopidogrel 75 mg daily for 3 weeks then continue with Plavix alone. Therapy recommendations:  outpt PT/SLP Disposition:  home      ROS:   14 system review  of systems performed and negative with exception of those listed in HPI  PMH:  Past Medical History:  Diagnosis Date   Anxiety    Bronchitis    CAD (coronary artery disease)    a. 04/2008: cariac arrest with prox LAD occ s/p stenting, arctic sun. b. 11/2008: restenosis s/p repeat stenting. c. 01/2011: abnormal stress test but cath reportedly OK.   Dysplastic nevus 11/04/2007   Ant. chest. Slight to moderate atypia, extends to one edge.    Dysplastic nevus 01/03/2009   Left breast. Minimal atypia, close to margin.   Hypothyroid    Myocardial infarction [facility]) 2010   stents and cardiac rehab    Stroke [facility])     PSH:  Past Surgical History:  Procedure Laterality Date   ANGIOPLASTY  2010   2 stents (femoral)    BUBBLE STUDY  04/23/2020   Procedure: BUBBLE STUDY;  Surgeon: Geralynn Rile, MD;  Location: [facility];  Service: Cardiovascular;;   IR CT HEAD LTD  04/18/2020   IR PERCUTANEOUS ART THROMBECTOMY/INFUSION INTRACRANIAL INC DIAG ANGIO  04/18/2020   LOOP RECORDER INSERTION N/A 04/23/2020   Procedure: LOOP RECORDER INSERTION;  Surgeon: Vickie Epley, MD;  Location: [place] CV [facility];  Service: Cardiovascular;  Laterality: N/A;   RADIOLOGY WITH ANESTHESIA N/A 04/18/2020   Procedure: IR WITH ANESTHESIA;  Surgeon: Radiologist, Medication, MD;  Location: [facility];  Service: Radiology;  Laterality: N/A;   TEE WITHOUT CARDIOVERSION N/A 04/23/2020   Procedure: TRANSESOPHAGEAL ECHOCARDIOGRAM (TEE);  Surgeon: Geralynn Rile, MD;  Location: [facility];  Service: Cardiovascular;  Laterality: N/A;   TUBAL LIGATION      Social History:  Social History   Socioeconomic History   Marital status: Married    Spouse name: Not on file   Number of children: Not on file   Years of education: Not on file   Highest education level: Not on file  Occupational History   Not on file  Tobacco Use   Smoking status: Former    Packs/day: 1.50    Types: Cigarettes    Start date:  03/22/1973    Quit date: 04/27/2008    Years since quitting: 12.3   Smokeless tobacco: Never  Vaping Use   Vaping Use: Never used  Substance and Sexual Activity   Alcohol use: No    Alcohol/week: 0.0 standard drinks   Drug use: Never   Sexual activity: Yes    Birth control/protection: Post-menopausal  Other Topics Concern   Not on file  Social History Narrative   Not on file   Social Determinants of Health   Financial Resource Strain: Not on file  Food Insecurity: Not on file  Transportation Needs: Not on file  Physical Activity: Not on file  Stress: Not on file  Social Connections: Not on file  Intimate Partner Violence: Not on file    Family History:  Family History  Problem Relation Age of Onset   Diabetes Mother    Hypertension Mother    Stroke Maternal Grandmother    Diabetes Maternal Grandfather    Stroke Maternal Grandfather     Medications:   Current Outpatient Medications on File Prior to Visit  Medication Sig Dispense Refill   Cholecalciferol 50 MCG (2000 UT) TABS Take 2,000 Units by mouth daily.     clopidogrel (PLAVIX) 75 MG tablet Take 1 tablet (75 mg total) by mouth daily. 90 tablet 3   cyanocobalamin (,VITAMIN B-12,) 1000 MCG/ML injection Inject 1,000 mcg into the muscle every 30 (thirty) days.     levothyroxine (SYNTHROID) 75 MCG tablet Take 75 mcg by mouth daily.     metoprolol succinate (TOPROL-XL) 25 MG 24 hr tablet Take 25 mg by mouth daily.     nitroGLYCERIN (NITROSTAT) 0.4 MG SL tablet Place 1 tablet (0.4 mg total) under the tongue every 5 (five) minutes as needed for chest pain. 25 tablet 3   pantoprazole (PROTONIX) 40 MG tablet Take 40 mg by mouth daily.     ramipril (ALTACE) 5 MG capsule Take 5 mg by mouth daily.     rosuvastatin (CRESTOR) 40 MG tablet Take 40 mg by mouth daily.     topiramate (TOPAMAX) 25 MG tablet Take 1 tablet (25 mg total) by mouth 2 (two) times daily. 60 tablet 3   ALPRAZolam (XANAX) 0.25 MG tablet Take 0.25 mg by mouth  daily as needed. (Patient not taking: Reported on 08/29/2020)     escitalopram (LEXAPRO) 10 MG tablet Take by mouth.     furosemide (LASIX) 20 MG tablet Take 20 mg by mouth daily as needed. (Patient not taking: Reported on 08/29/2020)     No current facility-administered medications on file prior to visit.    Allergies:   Allergies  Allergen Reactions   Sulfa Antibiotics Hives      OBJECTIVE:  Physical Exam  Vitals:  08/29/20 0941  BP: 122/64  Pulse: 70  SpO2: 99%  Weight: 211 lb 4 oz (95.8 kg)  Height: '5\' 5"'$  (1.651 m)    Body mass index is 35.15 kg/m. No results found.  General: well developed, well nourished,  very pleasant middle-age Caucasian female, seated, in no evident distress Head: head normocephalic and atraumatic.   Neck: supple with no carotid or supraclavicular bruits Cardiovascular: regular rate and rhythm, no murmurs Musculoskeletal: Tenderness over cervical paraspinal muscles and upper traps Skin:  no rash/petichiae Vascular:  Normal pulses all extremities   Neurologic Exam Mental Status: Awake and fully alert. Mild aphasia and verbal apraxia -worsens when trying to speak too quickly.  Able to follow commands without difficulty.  Oriented to place and time. Recent and remote memory intact. Attention span, concentration and fund of knowledge appropriate. Mood and affect appropriate.  Cranial Nerves: Pupils equal, briskly reactive to light. Extraocular movements full without nystagmus. Visual fields full to confrontation. Hearing intact. Facial sensation intact. Face, tongue, palate moves normally and symmetrically.  Motor: Normal bulk and tone. Normal strength in all tested extremity muscles Sensory.: intact to touch , pinprick , position and vibratory sensation.  Coordination: Rapid alternating movements normal in all extremities. Finger-to-nose and heel-to-shin performed accurately bilaterally. Gait and Station: Arises from chair without difficulty. Stance  is normal. Gait demonstrates normal stride length and balance without use of assistive device. Tandem walk and heel toe with mild difficulty.  Romberg negative. Reflexes: 1+ and symmetric. Toes downgoing.          ASSESSMENT: Tracey [name] is a 65 y.o. year old female presented with aphasia on 04/18/2020 with stroke work-up revealing left MCA M2 branch stroke s/p tPA and IR secondary to large vessel disease vs cardioembolic source s/p ILR. Vascular risk factors include +PFO on TEE, HTN, HLD, CAD, MI in 2010, former tobacco use (stopped after MI in 2010) and obesity.     PLAN:  L MCA stroke :  Residual deficit: Aphasia and verbal apraxia -improvement since prior visit but continues to struggle with worsening speech with emotions, fatigue or extensive conversation.  Discussed possibility of returning back to work as patient requests but did encourage her to speak with her employer regarding specific restrictions to ensure they will accommodate as I do believe she will greatly struggle to adequately perform her job function specific instructions. Will extend STD until 10/19/2020 (6 months since start) to further allow participation with therapy and further recovery time and get control over headaches. She will let me know if she is able to return back to work sooner Vascular headache: Discontinue topiramate as likely contributing to dizziness and brain fog. May consider trialing amitriptyline to help with both headache and anxiety - will follow up next week to see if symptoms improved after stopping topiramate.  Concern of some tension type headache with cervical tension -referral placed to PT for dry needling and further strengthening and stretching exercises Loop recorder has not shown atrial fibrillation thus far -personally reviewed monthly reports TEE evidence of small PFO not felt to be related or contributed to recent stroke continue clopidogrel 75 mg daily  and Crestor 40 mg daily for  secondary stroke prevention.   Discussed secondary stroke prevention measures and importance of close PCP follow up for aggressive stroke risk factor management  HTN: BP goal <130/90.  Stable on current regimen per PCP HLD: LDL goal <70. On Crestor 40 mg daily per PCP.     Follow up in  3 months or call earlier if needed    CC:  [facility] provider: Dr. Leonie Man PCP: Rusty Aus, MD    I spent 53 minutes of face-to-face and non-face-to-face time with patient and daughter.  This included previsit chart review, lab review, study review, order entry, electronic health record documentation, patient and daughter education and discussion regarding prior stroke, secondary stroke prevention measures and aggressive stroke risk factor management, review of loop recorder, residual deficits and further disability and potential to return back to work, continued headaches and further interventions and answered all other questions to patient and daughters satisfaction   [name], [facility]  [facility] [address] [place], [place] [address]  Phone [phone] Fax [phone] Note: This document was prepared with digital dictation and possible smart phrase technology. Any transcriptional errors that result from this process are unintentional.

## 2020-08-29 NOTE — Progress Notes (Signed)
[facility] Summary Report / Loop Recorder 

## 2020-08-30 ENCOUNTER — Other Ambulatory Visit: Payer: Self-pay

## 2020-08-30 ENCOUNTER — Encounter: Payer: Self-pay | Admitting: Obstetrics and Gynecology

## 2020-08-30 ENCOUNTER — Ambulatory Visit (INDEPENDENT_AMBULATORY_CARE_PROVIDER_SITE_OTHER): Payer: 59 | Admitting: Obstetrics and Gynecology

## 2020-08-30 VITALS — BP 115/82 | HR 65 | Ht 65.0 in | Wt 211.0 lb

## 2020-08-30 DIAGNOSIS — R3915 Urgency of urination: Secondary | ICD-10-CM

## 2020-08-30 DIAGNOSIS — N393 Stress incontinence (female) (male): Secondary | ICD-10-CM | POA: Diagnosis not present

## 2020-08-30 DIAGNOSIS — N8110 Cystocele, unspecified: Secondary | ICD-10-CM | POA: Diagnosis not present

## 2020-08-30 LAB — POCT URINALYSIS DIPSTICK
Appearance: NORMAL
Bilirubin, UA: NEGATIVE
Blood, UA: NEGATIVE
Glucose, UA: NEGATIVE
Ketones, UA: NEGATIVE
Leukocytes, UA: NEGATIVE
Nitrite, UA: NEGATIVE
Protein, UA: NEGATIVE
Spec Grav, UA: >=1.03 — AB (ref 1.010–1.025)
Urobilinogen, UA: 0.2 E.U./dL
pH, UA: 5.5 (ref 5.0–8.0)

## 2020-08-30 NOTE — Patient Instructions (Signed)
For treatment of stress urinary incontinence, which is leakage with physical activity/movement/strainging/coughing, we discussed expectant management versus nonsurgical options versus surgery. Nonsurgical options include weight loss, physical therapy, as well as a pessary.  Surgical options include a midurethral sling, which is a synthetic mesh sling that acts like a hammock under the urethra to prevent leakage of urine, and transurethral injection of a bulking agent.   URODYNAMICS (UDS) TEST INFORMATION  IMPORTANT: Please try to arrive with a comfortably full bladder!    What is UDS? Urodynamics is a bladder test used to evaluate how your bladder and urethra (tube you urinate out of) work to help find out the cause of your bladder symptoms and evaluate your bladder function in order to make the best treatment plan for you.   What to expect? A nurse will perform the test and will be with you during the entire exam. First we will have to empty your bladder on a special toilet.  After you have emptied your bladder, very small catheters (plastic tubing) will be placed into your bladder and into your vagina (or rectum). These special small catheters measure pressure to help measure your bladder function.  Your bladder will be gently filled with water and you will be asked to cough and strain at several different points during the test.   You will then be asked to empty your bladder in the special toilet with the catheters in place. Most patients can urinate (pee) easily with the catheters in place since the catheters are so small. In total this procedure lasts about 45 minutes to 1 hour.  After your test is completed, you will return (or possibly be seen the same day) to review the results, talk about treatment options and make a plan moving forward.

## 2020-08-30 NOTE — Progress Notes (Signed)
[facility]y [name]  Referring Provider: Malachy Mood, MD PCP: Rusty Aus, MD Date of Service: 08/30/2020  SUBJECTIVE Chief Complaint: New Patient (Initial Visit)- urinary leakage  History of Present Illness: Tracey [name] is a 65 y.o. White or Caucasian female seen in [name] at the request of Dr. Georgianne Fick for evaluation of incontinence.    Review of records from Dr Georgianne Fick significant for: Has mixed incontinence symptoms, but stress predominant.   Recently admitted to [facility] with a stroke in March.   Urinary Symptoms: Leaks urine with cough/ sneeze, laughing, exercise, lifting, going from sitting to standing, during sex, with a full bladder, with movement to the bathroom, with urgency, and without sensation. SUI > UUI Leaks "all the time" Pad use: 2 liners/ mini-pads per day.   She is bothered by her UI symptoms. Has done pelvic floor physical therapy.  Day time voids- every few hours.  Nocturia: 1 times per night to void. Voiding dysfunction: she does not empty her bladder well.  does not use a catheter to empty bladder.  When urinating, she feels a weak stream, dribbling after finishing, and the need to urinate multiple times in a row Drinks: 3-4 cups iced tea, 2-16oz water per day  UTIs:  0  UTI's in the last year.   Denies history of blood in urine and kidney or bladder stones  Pelvic Organ Prolapse Symptoms:                  She Denies a feeling of a bulge the vaginal area.   Bowel Symptom: Bowel movements: 1 time(s) per day Stool consistency: hard Straining: no.  Splinting: no.  Incomplete evacuation: no.  She Denies accidental bowel leakage / fecal incontinence Bowel regimen: none   Sexual Function Sexually active: yes.  Sexual orientation: Straight Pain with sex: No  Pelvic Pain Denies pelvic pain   Past Medical History:  Past Medical History:  Diagnosis Date   Anxiety    Bronchitis     CAD (coronary artery disease)    a. 04/2008: cariac arrest with prox LAD occ s/p stenting, arctic sun. b. 11/2008: restenosis s/p repeat stenting. c. 01/2011: abnormal stress test but cath reportedly OK.   Dysplastic nevus 11/04/2007   Ant. chest. Slight to moderate atypia, extends to one edge.    Dysplastic nevus 01/03/2009   Left breast. Minimal atypia, close to margin.   Hypothyroid    Myocardial infarction [facility]) 2010   stents and cardiac rehab    Stroke [facility])      Past Surgical History:   Past Surgical History:  Procedure Laterality Date   ANGIOPLASTY  2010   2 stents (femoral)    BUBBLE STUDY  04/23/2020   Procedure: BUBBLE STUDY;  Surgeon: Geralynn Rile, MD;  Location: [place];  Service: Cardiovascular;;   IR CT HEAD LTD  04/18/2020   IR PERCUTANEOUS ART THROMBECTOMY/INFUSION INTRACRANIAL INC DIAG ANGIO  04/18/2020   LOOP RECORDER INSERTION N/A 04/23/2020   Procedure: LOOP RECORDER INSERTION;  Surgeon: Vickie Epley, MD;  Location: [place] [facility];  Service: Cardiovascular;  Laterality: N/A;   RADIOLOGY WITH ANESTHESIA N/A 04/18/2020   Procedure: IR WITH ANESTHESIA;  Surgeon: Radiologist, Medication, MD;  Location: [place];  Service: Radiology;  Laterality: N/A;   TEE WITHOUT CARDIOVERSION N/A 04/23/2020   Procedure: TRANSESOPHAGEAL ECHOCARDIOGRAM (TEE);  Surgeon: Geralynn Rile, MD;  Location: [place];  Service: Cardiovascular;  Laterality: N/A;   TUBAL LIGATION  Past OB/GYN History: OB History     Gravida  5   Para      Term      Preterm      AB      Living  4      SAB      IAB      Ectopic      Multiple      Live Births  4          Vaginal deliveries: 4,  Forceps/ Vacuum deliveries: 0, Cesarean section: 0 Menopausal: Yes, at age 16, Denies vaginal bleeding since menopause Last pap smear was 03/2020- negative.     Medications: She has a current medication list which includes the following prescription(s):  cholecalciferol, clopidogrel, cyanocobalamin, escitalopram, levothyroxine, metoprolol succinate, nitroglycerin, pantoprazole, ramipril, and rosuvastatin.   Allergies: Patient is allergic to sulfa antibiotics.   Social History:  Social History   Tobacco Use   Smoking status: Former    Packs/day: 1.50    Types: Cigarettes    Start date: 03/22/1973    Quit date: 04/27/2008    Years since quitting: 12.3   Smokeless tobacco: Never  Vaping Use   Vaping Use: Never used  Substance Use Topics   Alcohol use: No    Alcohol/week: 0.0 standard drinks   Drug use: Never    Relationship status: married She lives with husband.   She is employed as an Scientist, physiological. Regular exercise: No History of abuse: No  Family History:   Family History  Problem Relation Age of Onset   Diabetes Mother    Hypertension Mother    Stroke Maternal Grandmother    Diabetes Maternal Grandfather    Stroke Maternal Grandfather      Review of Systems: Review of Systems  Constitutional:  Negative for fever, malaise/fatigue and weight loss.  Respiratory:  Negative for cough, shortness of breath and wheezing.   Cardiovascular:  Negative for chest pain, palpitations and leg swelling.  Gastrointestinal:  Negative for abdominal pain and blood in stool.  Genitourinary:  Negative for dysuria.  Musculoskeletal:  Negative for myalgias.  Skin:  Negative for rash.  Neurological:  Positive for dizziness and headaches.  Endo/Heme/Allergies:  Bruises/bleeds easily.  Psychiatric/Behavioral:  Negative for depression. The patient is nervous/anxious.     OBJECTIVE Physical Exam: Vitals:   08/30/20 0936  BP: 115/82  Pulse: 65  Weight: 211 lb (95.7 kg)  Height: '5\' 5"'$  (1.651 m)    Physical Exam Constitutional:      General: She is not in acute distress. Pulmonary:     Effort: Pulmonary effort is normal.  Abdominal:     General: There is no distension.     Palpations: Abdomen is soft.     Tenderness: There is no  abdominal tenderness. There is no rebound.  Musculoskeletal:        General: No swelling. Normal range of motion.  Skin:    General: Skin is warm and dry.     Findings: No rash.  Neurological:     Mental Status: She is alert and oriented to person, place, and time.  Psychiatric:        Mood and Affect: Mood normal.        Behavior: Behavior normal.     GU / Detailed Urogynecologic Evaluation:  Pelvic Exam: Normal external female genitalia; Bartholin's and Skene's glands normal in appearance; urethral meatus normal in appearance, no urethral masses or discharge.   CST: negative  Speculum exam reveals normal vaginal  mucosa with atrophy. Cervix normal appearance. Uterus normal single, nontender. Adnexa no mass, fullness, tenderness.     Pelvic floor strength II/V  Pelvic floor musculature: Right levator non-tender, Right obturator non-tender, Left levator non-tender, Left obturator non-tender  POP-Q:   POP-Q  -1                                            Aa   -1                                           Ba  -6.5                                              C   3                                            Gh  3                                            Pb  9                                            tvl   -3                                            Ap  -3                                            Bp  -8                                              D     Rectal Exam:  Normal external rectum  Post-Void Residual (PVR) by Bladder Scan: In order to evaluate bladder emptying, we discussed obtaining a postvoid residual and she agreed to this procedure.  Procedure: The ultrasound unit was placed on the patient's abdomen in the suprapubic region after the patient had voided. A PVR of 100 ml was obtained by bladder scan.  Laboratory Results: POC urine: negative   ASSESSMENT AND PLAN Ms. Flammia is a 65 y.o. with:  1. SUI (stress urinary incontinence, female)    2. Urgency of urination   3. Prolapse of anterior vaginal wall     SUI For treatment of stress urinary incontinence,  non-surgical options include expectant management, weight loss, physical therapy, as well as a pessary.  Surgical options include a  midurethral sling, Burch urethropexy, and transurethral injection of a bulking agent. - She is interested in urethral bulking in the office. We discussed that its effectiveness is approximately 60-70% so she may still leak urine but see improvement. Handouts about the procedure were provided. She will need to undergo urodynamic testing as she has mixed incontinence and did not demonstrate leakage on exam today.  - We discussed that she is not an ideal surgical candidate at this time as she is still recovering from her stroke.   2. Urgency - not as bothersome, will defer treatment at this time  3. Stage II anterior, Stage I posterior, Stage I apical prolapse - She is asymptomatic from her prolapse. Will expectantly manage at this time.   Return for Urodynamic testing  Jaquita Folds, MD   Medical Decision Making:  - Reviewed/ ordered a clinical laboratory test - Reviewed/ ordered medicine test - Review and summation of prior records

## 2020-08-30 NOTE — Progress Notes (Signed)
I agree with the above plan 

## 2020-09-04 ENCOUNTER — Encounter: Payer: Self-pay | Admitting: Adult Health

## 2020-09-04 ENCOUNTER — Other Ambulatory Visit: Payer: Self-pay

## 2020-09-04 ENCOUNTER — Ambulatory Visit: Payer: 59 | Attending: Internal Medicine | Admitting: Speech Pathology

## 2020-09-04 DIAGNOSIS — R482 Apraxia: Secondary | ICD-10-CM | POA: Diagnosis present

## 2020-09-04 DIAGNOSIS — R4701 Aphasia: Secondary | ICD-10-CM | POA: Diagnosis present

## 2020-09-04 DIAGNOSIS — I6602 Occlusion and stenosis of left middle cerebral artery: Secondary | ICD-10-CM | POA: Diagnosis present

## 2020-09-05 ENCOUNTER — Ambulatory Visit: Payer: 59 | Admitting: Adult Health

## 2020-09-05 NOTE — Progress Notes (Signed)
No problem. Thanks for the follow up!

## 2020-09-05 NOTE — Therapy (Signed)
[place] [facility] [address] [place], [place], [address] Phone: [phone]   Fax:  [phone]  Speech Language Pathology Treatment  Patient Details  Name: Tracey Tracey MRN: [id number] Date of Birth: 1955/06/27 Referring Provider (SLP): Tracey Filbert, MD   Encounter Date: 09/04/2020   End of Session - 09/05/20 1612     Visit Number 18    Number of Visits 27    Date for SLP Re-Evaluation 11/07/20    Authorization Type [employer] 56 visits for ST max    Authorization Time Period 06/12/20    Authorization - Visit Number 8    Progress Note Due on Visit 10    SLP Start Time 1610    SLP Stop Time  1700    SLP Time Calculation (min) 50 min    Activity Tolerance Patient tolerated treatment well             Past Medical History:  Diagnosis Date   Anxiety    Bronchitis    CAD (coronary artery disease)    a. 04/2008: cariac arrest with prox LAD occ s/p stenting, arctic sun. b. 11/2008: restenosis s/p repeat stenting. c. 01/2011: abnormal stress test but cath reportedly OK.   Dysplastic nevus 11/04/2007   Ant. chest. Slight Tracey moderate atypia, extends Tracey one edge.    Dysplastic nevus 01/03/2009   Left breast. Minimal atypia, close Tracey margin.   Hypothyroid    Myocardial infarction [facility]) 2010   stents and cardiac rehab    Stroke [facility])     Past Surgical History:  Procedure Laterality Date   ANGIOPLASTY  2010   2 stents (femoral)    BUBBLE STUDY  04/23/2020   Procedure: BUBBLE STUDY;  Surgeon: Tracey Rile, MD;  Location: [place];  Service: Cardiovascular;;   IR CT HEAD LTD  04/18/2020   IR PERCUTANEOUS ART THROMBECTOMY/INFUSION INTRACRANIAL [name] DIAG ANGIO  04/18/2020   LOOP RECORDER INSERTION N/A 04/23/2020   Procedure: LOOP RECORDER INSERTION;  Surgeon: Tracey Epley, MD;  Location: [place] [facility];  Service: Cardiovascular;  Laterality: N/A;   RADIOLOGY WITH ANESTHESIA N/A 04/18/2020   Procedure: IR WITH  ANESTHESIA;  Surgeon: Radiologist, Medication, MD;  Location: [place];  Service: Radiology;  Laterality: N/A;   TEE WITHOUT CARDIOVERSION N/A 04/23/2020   Procedure: TRANSESOPHAGEAL ECHOCARDIOGRAM (TEE);  Surgeon: Tracey Rile, MD;  Location: [place];  Service: Cardiovascular;  Laterality: N/A;   TUBAL LIGATION      There were no vitals filed for this visit.   Subjective Assessment - 09/05/20 1605     Subjective Stopped taking Topamax last week.    Currently in Pain? No/denies                   ADULT SLP TREATMENT - 09/05/20 1607       General Information   Behavior/Cognition Alert;Cooperative    HPI Tracey Tracey is a 65 y.o. female with a PMHx of CAD s/p MI, cardiac arrest, HTN, hypothryoidism, HLD, and anxiety. Presented [facility] ED with slurred speech and word finding difficulty. CTA head and neck showed a distal M2 occlusion in left MCA branch. Per chart tPA emergently stopped when increased dysarthria noted> emergent head CT showed no intracranial hemorrhage. Pt transferred Tracey Tracey and underwent revascularization of occluded Lt MCA following left MCA embolism. Seen by SLP during acute stay with diagnosis of global aphasia; however verbal output much improved by d/c on 04/20/20.  Treatment Provided   Treatment provided Cognitive-Linquistic      Pain Assessment   Pain Assessment No/denies pain      Cognitive-Linquistic Treatment   Treatment focused on Apraxia;Aphasia;Patient/family/caregiver education    Skilled Treatment SLP facilitated mod complex conversation which was functional for pt; she had very few breakdowns but slowed rate and self-corrected when these occurred. Pt saw NP Tracey Tracey last week and is planning return Tracey work part time on 9/6 and full time 9/26. Pt reports improvement with fatigue, balance, attention, and focusing since stopping Topamax last week. Has been referred Tracey PT for dry needling. SLP worked with pt Tracey ID common  questions she might be asked when returning Tracey work and preplanning what details she would like Tracey share about her stroke and the impacts of aphasia. Pt generated 3-4 sentence responses for proposed scenarios. Shared aphasia documentary resource for pt Tracey view at home for additional ideas about how she can self-advocate in the workplace.      Assessment / Recommendations / Plan   Plan Continue with current plan of care      Progression Toward Goals   Progression toward goals Progressing toward goals              SLP Education - 09/05/20 1611     Education Details aphasia resources, self-advocacy/self-disclosure    Person(s) Educated Patient    Methods Explanation    Comprehension Verbalized understanding;Need further instruction              SLP Short Term Goals - 08/09/20 1134       SLP SHORT TERM GOAL #1   Title Patient will complete high-level wordfinding tasks >90% accuracy.    Time 10    Period --   sessions   Status Achieved      SLP SHORT TERM GOAL #2   Title Patient will read multisyllabic words using compensations for apraxia >90% accuracy.    Time 10    Period Weeks    Status Achieved      SLP SHORT TERM GOAL #3   Title Patient will demonstrate comprehension of paragraphs 8-10 sentences by answering questions >90% accuracy.    Time 10    Period --   sessions   Status Achieved      SLP SHORT TERM GOAL #4   Title Patient will use compensations for aphasia and apraxia in 5-8 minutes mod complex conversation and self-correct breakdowns >90% of the time with modified independence.    Time 10    Period --   sessions   Status Achieved              SLP Long Term Goals - 08/09/20 1134       SLP LONG TERM GOAL #1   Title Patient will report use of strategies for aphasia/apraxia Tracey successfully make phone calls related Tracey health, finances, and/or job functions.    Time 12    Period Weeks    Status On-going    Target Date 09/20/20   continue all LTGs      SLP LONG TERM GOAL #2   Title Patient will comprehend work-type emails 100% accuracy with use of compensations if needed (text-Tracey-speech, etc).    Time 12    Period Weeks    Status On-going    Target Date 09/20/20      SLP LONG TERM GOAL #3   Title Patient will write work-type emails and self-correct >95% of errors with compensations allowed (speech-Tracey-text,  text-Tracey-speech, double-checking).    Time 12    Period Weeks    Status On-going    Target Date 09/20/20      SLP LONG TERM GOAL #4   Title Patient will use compensations for anomia and apraxia functionally in 20+ minutes complex conversation, repairing breakdowns independently.    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 Speech is grossly functional in semi-complex conversation when using slow rate; pt has more breakdowns when emotional, stressed or fatigued. Pt is planning Tracey return Tracey work part time in ~4 weeks before resuming full-time. Continue Tracey educate on accommodations such as reduced workload, reduced language demands (such as scanning/clerical activities vs making/receiving phone calls or writing emails). Also targeted pre-planning/scripting for common questions/conversations Tracey reduce anxiety and improve self-advocacy abilities. Patient is making progress toward goals and would like Tracey continue ST at frequency of 1x per week. Skilled ST for aphasia and apraxia remains necessary Tracey maximize verbal and written expression as well as reading comprehension for return Tracey PLOF and work/community activities.    Speech Therapy Frequency 1x /week    Duration 12 weeks    Treatment/Interventions Language facilitation;Environmental controls;Cueing hierarchy;SLP instruction and feedback;Functional tasks;Compensatory strategies;Cognitive  reorganization;Internal/external aids;Multimodal communcation approach;Patient/family education    Potential Tracey Achieve Goals Good    SLP Home Exercise Plan see pt instructions    Consulted and Agree with Plan of Care Patient;Family member/caregiver    Family Member Consulted pt's daughter             Patient will benefit from skilled therapeutic intervention in order Tracey improve the following deficits and impairments:   Aphasia  Apraxia  Middle cerebral artery embolism, left    Problem List Patient Active Problem List   Diagnosis Date Noted   Acute stroke due Tracey ischemia ([place]) 04/18/2020   Middle cerebral artery embolism, left 04/18/2020   Atherosclerosis of native arteries of extremity with intermittent claudication ([place]) 10/05/2019   GERD (gastroesophageal reflux disease) 09/26/2019   Low serum vitamin D 08/25/2018   B12 deficiency 11/21/2015   Acquired hypothyroidism 08/09/2015   Cardiomyopathy, ischemic 08/09/2015   Hyperlipidemia, mixed 08/09/2015   Dizziness 06/13/2012   CAD (coronary artery disease) 06/13/2012   H/O irritable bowel syndrome 10/20/2011   Deneise Lever, [place], CCC-SLP Speech-Language Pathologist  Aliene Altes 09/05/2020, 4:15 PM  [facility] [address] [place], [place], [address] Phone: [phone]   Fax:  [phone]   Name: Tracey Tracey MRN: [id number] Date of Birth: 09/14/1955

## 2020-09-05 NOTE — Progress Notes (Signed)
Referral was placed for PT at [facility] to eval for dry needling on 8/3

## 2020-09-10 ENCOUNTER — Encounter: Payer: Self-pay | Admitting: *Deleted

## 2020-09-10 ENCOUNTER — Ambulatory Visit (INDEPENDENT_AMBULATORY_CARE_PROVIDER_SITE_OTHER): Payer: 59

## 2020-09-10 DIAGNOSIS — I255 Ischemic cardiomyopathy: Secondary | ICD-10-CM

## 2020-09-10 LAB — CUP PACEART REMOTE DEVICE CHECK
Date Time Interrogation Session: 20220807163151
Implantable Pulse Generator Implant Date: 20220328

## 2020-09-10 NOTE — Telephone Encounter (Signed)
Thank you. Signed and placed in out box

## 2020-09-10 NOTE — Telephone Encounter (Signed)
Can patient have a return to work note as requested.  Thank you.

## 2020-09-11 ENCOUNTER — Ambulatory Visit: Payer: 59 | Admitting: Speech Pathology

## 2020-09-18 ENCOUNTER — Ambulatory Visit: Payer: 59 | Admitting: Speech Pathology

## 2020-09-18 ENCOUNTER — Other Ambulatory Visit: Payer: Self-pay

## 2020-09-18 DIAGNOSIS — R4701 Aphasia: Secondary | ICD-10-CM | POA: Diagnosis not present

## 2020-09-18 DIAGNOSIS — I6602 Occlusion and stenosis of left middle cerebral artery: Secondary | ICD-10-CM

## 2020-09-18 DIAGNOSIS — R482 Apraxia: Secondary | ICD-10-CM

## 2020-09-19 NOTE — Therapy (Signed)
[place] MAIN [facility] SERVICES [address] [place], [place], [address] Phone: [phone]   Fax:  [phone]  Speech Language Pathology Treatment  Patient Details  Name: Tracey [name] MRN: [id number] Date of Birth: March 25, 1955 Referring Provider (SLP): Emily Filbert, MD   Encounter Date: 09/18/2020   End of Session - 09/19/20 0829     Visit Number 19    Number of Visits 27    Date for SLP Re-Evaluation 11/07/20    Authorization - Visit Number 9    Progress Note Due on Visit 10    SLP Start Time 1600    SLP Stop Time  1700    SLP Time Calculation (min) 60 min    Activity Tolerance Patient tolerated treatment well             Past Medical History:  Diagnosis Date   Anxiety    Bronchitis    CAD (coronary artery disease)    a. 04/2008: cariac arrest with prox LAD occ s/p stenting, arctic sun. b. 11/2008: restenosis s/p repeat stenting. c. 01/2011: abnormal stress test but cath reportedly OK.   Dysplastic nevus 11/04/2007   Ant. chest. Slight to moderate atypia, extends to one edge.    Dysplastic nevus 01/03/2009   Left breast. Minimal atypia, close to margin.   Hypothyroid    Myocardial infarction [facility]) 2010   stents and cardiac rehab    Stroke [facility])     Past Surgical History:  Procedure Laterality Date   ANGIOPLASTY  2010   2 stents (femoral)    BUBBLE STUDY  04/23/2020   Procedure: BUBBLE STUDY;  Surgeon: Geralynn Rile, MD;  Location: [place];  Service: Cardiovascular;;   IR CT HEAD LTD  04/18/2020   IR PERCUTANEOUS ART THROMBECTOMY/INFUSION INTRACRANIAL INC DIAG ANGIO  04/18/2020   LOOP RECORDER INSERTION N/A 04/23/2020   Procedure: LOOP RECORDER INSERTION;  Surgeon: Vickie Epley, MD;  Location: [place] CV LAB;  Service: Cardiovascular;  Laterality: N/A;   RADIOLOGY WITH ANESTHESIA N/A 04/18/2020   Procedure: IR WITH ANESTHESIA;  Surgeon: Radiologist, Medication, MD;  Location: [place];  Service:  Radiology;  Laterality: N/A;   TEE WITHOUT CARDIOVERSION N/A 04/23/2020   Procedure: TRANSESOPHAGEAL ECHOCARDIOGRAM (TEE);  Surgeon: Geralynn Rile, MD;  Location: [place];  Service: Cardiovascular;  Laterality: N/A;   TUBAL LIGATION      There were no vitals filed for this visit.   Subjective Assessment - 09/19/20 0825     Subjective Patient reports she is looking forward to going back to work.    Currently in Pain? No/denies                   ADULT SLP TREATMENT - 09/19/20 0825       General Information   Behavior/Cognition Alert;Cooperative    HPI Tracey [name] is a 65 y.o. female with a PMHx of CAD s/p MI, cardiac arrest, HTN, hypothryoidism, HLD, and anxiety. Presented to [facility] ED with slurred speech and word finding difficulty. CTA head and neck showed a distal M2 occlusion in left MCA branch. Per chart tPA emergently stopped when increased dysarthria noted> emergent head CT showed no intracranial hemorrhage. Pt transferred to [facility] and underwent revascularization of occluded Lt MCA following left MCA embolism. Seen by SLP during acute stay with diagnosis of global aphasia; however verbal output much improved by d/c on 04/20/20.      Treatment Provided   Treatment provided Cognitive-Linquistic  Pain Assessment   Pain Assessment No/denies pain      Cognitive-Linquistic Treatment   Treatment focused on Apraxia;Aphasia;Patient/family/caregiver education    Skilled Treatment Skilled ST session focused on complex conversation mod complex-complex reading comprehension. Patient read first chapter of a novel of interest; independently used compensations to facilitate comprehension, such as slow rate, re-reading.  Patient reported that at times when she finishes reading something, she cannot remember what she read at the beginning.  Suggested note taking as a compensation to aid focus and recall.  Patient home task is to target written email communication;  patient to respond to email sent by therapist. Pt to summarize and relate her thoughts on a topic of interest.      Assessment / Recommendations / [place] with current plan of care      Progression Toward Goals   Progression toward goals Progressing toward goals              SLP Education - 09/19/20 0827     Education Details take notes when reading    Person(s) Educated Patient    Methods Explanation    Comprehension Verbalized understanding              SLP Short Term Goals - 08/09/20 1134       SLP SHORT TERM GOAL #1   Title Patient will complete high-level wordfinding tasks >90% accuracy.    Time 10    Period --   sessions   Status Achieved      SLP SHORT TERM GOAL #2   Title Patient will read multisyllabic words using compensations for apraxia >90% accuracy.    Time 10    Period Weeks    Status Achieved      SLP SHORT TERM GOAL #3   Title Patient will demonstrate comprehension of paragraphs 8-10 sentences by answering questions >90% accuracy.    Time 10    Period --   sessions   Status Achieved      SLP SHORT TERM GOAL #4   Title Patient will use compensations for aphasia and apraxia in 5-8 minutes mod complex conversation and self-correct breakdowns >90% of the time with modified independence.    Time 10    Period --   sessions   Status Achieved              SLP Long Term Goals - 08/09/20 1134       SLP LONG TERM GOAL #1   Title Patient will report use of strategies for aphasia/apraxia to successfully make phone calls related to health, finances, and/or job functions.    Time 12    Period Weeks    Status On-going    Target Date 09/20/20   continue all LTGs     SLP LONG TERM GOAL #2   Title Patient will comprehend work-type emails 100% accuracy with use of compensations if needed (text-to-speech, etc).    Time 12    Period Weeks    Status On-going    Target Date 09/20/20      SLP LONG TERM GOAL #3   Title Patient will  write work-type emails and self-correct >95% of errors with compensations allowed (speech-to-text, text-to-speech, double-checking).    Time 12    Period Weeks    Status On-going    Target Date 09/20/20      SLP LONG TERM GOAL #4   Title Patient will use compensations for anomia and apraxia functionally in 20+ minutes  complex conversation, repairing breakdowns independently.    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  Speech and fluidity and conversation continues to improve, and patient reports she is not getting as fatigued in the evenings.  Patient is looking forward to returning to work in approximately 2 weeks; will continue ST with focus on reading and writing, as patient requires extended time for sentence formulation and workplace duties include reading and responding to emails frequently. Patient is making progress toward goals and would like to continue ST at frequency of 1x per week. Skilled ST for aphasia and apraxia remains necessary to maximize verbal and written expression as well as reading comprehension for return to PLOF and work/community activities.    Speech Therapy Frequency 1x /week    Duration 12 weeks    Treatment/Interventions Language facilitation;Environmental controls;Cueing hierarchy;SLP instruction and feedback;Functional tasks;Compensatory strategies;Cognitive reorganization;Internal/external aids;Multimodal communcation approach;Patient/family education    Potential to Achieve Goals Good    SLP Home Exercise Plan see pt instructions    Consulted and Agree with Plan of Care Patient;Family member/caregiver    Family Member Consulted pt's daughter             Patient will benefit from skilled therapeutic intervention in order to improve the following  deficits and impairments:   Aphasia  Verbal apraxia  Middle cerebral artery embolism, left    Problem List Patient Active Problem List   Diagnosis Date Noted   Acute stroke due to ischemia ([place]) 04/18/2020   Middle cerebral artery embolism, left 04/18/2020   Atherosclerosis of native arteries of extremity with intermittent claudication ([place]) 10/05/2019   GERD (gastroesophageal reflux disease) 09/26/2019   Low serum vitamin D 08/25/2018   B12 deficiency 11/21/2015   Acquired hypothyroidism 08/09/2015   Cardiomyopathy, ischemic 08/09/2015   Hyperlipidemia, mixed 08/09/2015   Dizziness 06/13/2012   CAD (coronary artery disease) 06/13/2012   H/O irritable bowel syndrome 10/20/2011   Deneise Lever, [place], CCC-SLP Speech-Language Pathologist  Aliene Altes 09/19/2020, 10:48 AM  [place] [address] [place], [place], [address] Phone: [phone]   Fax:  [phone]   Name: Tracey [name] MRN: [id number] Date of Birth: July 30, 1955

## 2020-09-25 ENCOUNTER — Other Ambulatory Visit: Payer: Self-pay

## 2020-09-25 ENCOUNTER — Ambulatory Visit: Payer: 59 | Admitting: Speech Pathology

## 2020-09-25 DIAGNOSIS — R4701 Aphasia: Secondary | ICD-10-CM | POA: Diagnosis not present

## 2020-09-25 DIAGNOSIS — R482 Apraxia: Secondary | ICD-10-CM

## 2020-09-25 NOTE — Therapy (Signed)
[place] MAIN [facility] SERVICES [address] [place], [place], [address] Phone: [phone]   Fax:  [phone]  Speech Language Pathology Treatment and Progress Update  Patient Details  Name: Tracey [name] MRN: [id number] Date of Birth: [birth year] Referring Provider (SLP): Emily Filbert, MD  Speech Therapy Progress Note  Dates of Reporting Period: 06/12/20 to 09/25/20.  Objective: Patient has been seen for 10 speech therapy sessions this reporting period targeting aphasia and apraxia. Patient is making progress toward LTGs and met 2/3 LTGs this reporting period. Anticipate d/c in next 1-3 sessions unless patient has new goals after return to work. See skilled intervention, clinical impressions, and goals below for details.  Encounter Date: 09/25/2020   End of Session - 09/25/20 1555     Visit Number 20    Number of Visits 27    Date for SLP Re-Evaluation 11/07/20    Authorization Type [employer] 60 visits for ST max    Authorization - Visit Number 10    Progress Note Due on Visit 10    SLP Start Time 1500    SLP Stop Time  6283    SLP Time Calculation (min) 50 min    Activity Tolerance Patient tolerated treatment well             Past Medical History:  Diagnosis Date   Anxiety    Bronchitis    CAD (coronary artery disease)    a. 04/2008: cariac arrest with prox LAD occ s/p stenting, arctic sun. b. 11/2008: restenosis s/p repeat stenting. c. 01/2011: abnormal stress test but cath reportedly OK.   Dysplastic nevus 11/04/2007   Ant. chest. Slight to moderate atypia, extends to one edge.    Dysplastic nevus 01/03/2009   Left breast. Minimal atypia, close to margin.   Hypothyroid    Myocardial infarction [facility]) 2010   stents and cardiac rehab    Stroke [facility])     Past Surgical History:  Procedure Laterality Date   ANGIOPLASTY  2010   2 stents (femoral)    BUBBLE STUDY  04/23/2020   Procedure: BUBBLE STUDY;  Surgeon: Geralynn Rile, MD;  Location: [place];  Service: Cardiovascular;;   IR CT HEAD LTD  04/18/2020   IR PERCUTANEOUS ART THROMBECTOMY/INFUSION INTRACRANIAL INC DIAG ANGIO  04/18/2020   LOOP RECORDER INSERTION N/A 04/23/2020   Procedure: LOOP RECORDER INSERTION;  Surgeon: Vickie Epley, MD;  Location: [place] CV LAB;  Service: Cardiovascular;  Laterality: N/A;   RADIOLOGY WITH ANESTHESIA N/A 04/18/2020   Procedure: IR WITH ANESTHESIA;  Surgeon: Radiologist, Medication, MD;  Location: [place];  Service: Radiology;  Laterality: N/A;   TEE WITHOUT CARDIOVERSION N/A 04/23/2020   Procedure: TRANSESOPHAGEAL ECHOCARDIOGRAM (TEE);  Surgeon: Geralynn Rile, MD;  Location: [place];  Service: Cardiovascular;  Laterality: N/A;   TUBAL LIGATION      There were no vitals filed for this visit.   Subjective Assessment - 09/25/20 1552     Subjective Patient is returning to work next week    Currently in Pain? No/denies                   ADULT SLP TREATMENT - 09/25/20 1552       General Information   Behavior/Cognition Alert;Cooperative    HPI Tracey [name] is a 65 y.o. female with a PMHx of CAD s/p MI, cardiac arrest, HTN, hypothryoidism, HLD, and anxiety. Presented to [facility] ED with slurred speech and word finding  difficulty. CTA head and neck showed a distal M2 occlusion in left MCA branch. Per chart tPA emergently stopped when increased dysarthria noted> emergent head CT showed no intracranial hemorrhage. Pt transferred to [facility] and underwent revascularization of occluded Lt MCA following left MCA embolism. Seen by SLP during acute stay with diagnosis of global aphasia; however verbal output much improved by d/c on 04/20/20.      Treatment Provided   Treatment provided Cognitive-Linquistic      Pain Assessment   Pain Assessment No/denies pain      Cognitive-Linquistic Treatment   Treatment focused on Apraxia;Aphasia;Patient/family/caregiver education    Skilled Treatment  Patient replied to SLP email and summarized the aphasia documentary she watched. Syntax 99% accurate; she omitted one functor word. Reviewed use of text-to-speech for error checking, and suggested use of editing software to facilitate drafting quicker replies. Pt to explore these options and practice by emailing SLP about her first day of work.      Assessment / Recommendations / Plan   Plan Continue with current plan of care      Progression Toward Goals   Progression toward goals Progressing toward goals              SLP Education - 09/25/20 1555     Education Details editing software, text-to-speech    Person(s) Educated Patient    Methods Explanation    Comprehension Verbalized understanding              SLP Short Term Goals - 09/25/20 1556       SLP SHORT TERM GOAL #1   Title Patient will complete high-level wordfinding tasks >90% accuracy.    Time 10    Period --   sessions   Status Achieved      SLP SHORT TERM GOAL #2   Title Patient will read multisyllabic words using compensations for apraxia >90% accuracy.    Time 10    Period Weeks    Status Achieved      SLP SHORT TERM GOAL #3   Title Patient will demonstrate comprehension of paragraphs 8-10 sentences by answering questions >90% accuracy.    Time 10    Period --   sessions   Status Achieved      SLP SHORT TERM GOAL #4   Title Patient will use compensations for aphasia and apraxia in 5-8 minutes mod complex conversation and self-correct breakdowns >90% of the time with modified independence.    Time 10    Period --   sessions   Status Achieved              SLP Long Term Goals - 09/25/20 1556       SLP LONG TERM GOAL #1   Title Patient will report use of strategies for aphasia/apraxia to successfully make phone calls related to health, finances, and/or job functions.    Time 12    Period Weeks    Status Achieved      SLP LONG TERM GOAL #2   Title Patient will comprehend work-type emails  100% accuracy with use of compensations if needed (text-to-speech, etc).    Time 12    Period Weeks    Status On-going      SLP LONG TERM GOAL #3   Title Patient will write work-type emails and self-correct >95% of errors with compensations allowed (speech-to-text, text-to-speech, double-checking).    Time 12    Period Weeks    Status On-going  SLP LONG TERM GOAL #4   Title Patient will use compensations for anomia and apraxia functionally in 20+ minutes complex conversation, repairing breakdowns independently.    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  Speech and fluidity and conversation continues to improve, and patient reports she is not getting as fatigued in the evenings.  Returning to work next week; continue [place] with focus on written communication as patient will need to send emails frequently at work. Patient is making progress toward goals and would like to continue ST at frequency of 1x per week. Skilled ST for aphasia and apraxia remains necessary to maximize verbal and written expression as well as reading comprehension for return to PLOF and work/community activities.    Speech Therapy Frequency 1x /week    Duration 12 weeks    Treatment/Interventions Language facilitation;Environmental controls;Cueing hierarchy;SLP instruction and feedback;Functional tasks;Compensatory strategies;Cognitive reorganization;Internal/external aids;Multimodal communcation approach;Patient/family education    Potential to Achieve Goals Good    SLP Home Exercise Plan see pt instructions    Consulted and Agree with Plan of Care Patient;Family member/caregiver    Family Member Consulted pt's daughter             Patient will benefit from skilled therapeutic intervention in order to improve the following deficits  and impairments:   Aphasia  Verbal apraxia    Problem List Patient Active Problem List   Diagnosis Date Noted   Acute stroke due to ischemia ([place]) 04/18/2020   Middle cerebral artery embolism, left 04/18/2020   Atherosclerosis of native arteries of extremity with intermittent claudication ([place]) 10/05/2019   GERD (gastroesophageal reflux disease) 09/26/2019   Low serum vitamin D 08/25/2018   B12 deficiency 11/21/2015   Acquired hypothyroidism 08/09/2015   Cardiomyopathy, ischemic 08/09/2015   Hyperlipidemia, mixed 08/09/2015   Dizziness 06/13/2012   CAD (coronary artery disease) 06/13/2012   H/O irritable bowel syndrome 10/20/2011   Deneise Lever, [place], CCC-SLP Speech-Language Pathologist  Aliene Altes 09/25/2020, 3:57 PM  [place] [address] [place], [place], [address] Phone: [phone]   Fax:  [phone]   Name: Tracey [name] MRN: [id number] Date of Birth: 1955/04/13

## 2020-09-28 NOTE — Progress Notes (Signed)
[facility] Summary Report / Loop Recorder 

## 2020-10-03 ENCOUNTER — Other Ambulatory Visit: Payer: Self-pay

## 2020-10-03 ENCOUNTER — Ambulatory Visit: Payer: 59 | Attending: Internal Medicine | Admitting: Speech Pathology

## 2020-10-03 ENCOUNTER — Other Ambulatory Visit: Payer: Self-pay | Admitting: Adult Health

## 2020-10-03 DIAGNOSIS — I6602 Occlusion and stenosis of left middle cerebral artery: Secondary | ICD-10-CM | POA: Diagnosis present

## 2020-10-03 DIAGNOSIS — R4701 Aphasia: Secondary | ICD-10-CM | POA: Insufficient documentation

## 2020-10-03 DIAGNOSIS — R482 Apraxia: Secondary | ICD-10-CM | POA: Diagnosis present

## 2020-10-04 NOTE — Therapy (Signed)
[place] MAIN [facility] SERVICES [address] [place], [place], [address] Phone: [phone]   Fax:  [phone]  Speech Language Pathology Treatment and Discharge Summary  Patient Details  Name: Tracey [name] MRN: [id number] Date of Birth: [birth year] Referring Provider (SLP): Emily Filbert, MD   Encounter Date: 10/03/2020   End of Session - 10/04/20 0815     Visit Number 21    Number of Visits 27    Date for SLP Re-Evaluation 11/07/20    Authorization Type [employer] 76 visits for ST max    Authorization Time Period 06/12/20    Authorization - Visit Number 1    Progress Note Due on Visit 10    SLP Start Time 36    SLP Stop Time  7564    SLP Time Calculation (min) 55 min    Activity Tolerance Patient tolerated treatment well             Past Medical History:  Diagnosis Date   Anxiety    Bronchitis    CAD (coronary artery disease)    a. 04/2008: cariac arrest with prox LAD occ s/p stenting, arctic sun. b. 11/2008: restenosis s/p repeat stenting. c. 01/2011: abnormal stress test but cath reportedly OK.   Dysplastic nevus 11/04/2007   Ant. chest. Slight to moderate atypia, extends to one edge.    Dysplastic nevus 01/03/2009   Left breast. Minimal atypia, close to margin.   Hypothyroid    Myocardial infarction [facility]) 2010   stents and cardiac rehab    Stroke [facility])     Past Surgical History:  Procedure Laterality Date   ANGIOPLASTY  2010   2 stents (femoral)    BUBBLE STUDY  04/23/2020   Procedure: BUBBLE STUDY;  Surgeon: Geralynn Rile, MD;  Location: [place];  Service: Cardiovascular;;   IR CT HEAD LTD  04/18/2020   IR PERCUTANEOUS ART THROMBECTOMY/INFUSION INTRACRANIAL INC DIAG ANGIO  04/18/2020   LOOP RECORDER INSERTION N/A 04/23/2020   Procedure: LOOP RECORDER INSERTION;  Surgeon: Vickie Epley, MD;  Location: [place] CV LAB;  Service: Cardiovascular;  Laterality: N/A;   RADIOLOGY WITH ANESTHESIA N/A 04/18/2020    Procedure: IR WITH ANESTHESIA;  Surgeon: Radiologist, Medication, MD;  Location: [place];  Service: Radiology;  Laterality: N/A;   TEE WITHOUT CARDIOVERSION N/A 04/23/2020   Procedure: TRANSESOPHAGEAL ECHOCARDIOGRAM (TEE);  Surgeon: Geralynn Rile, MD;  Location: [place];  Service: Cardiovascular;  Laterality: N/A;   TUBAL LIGATION      There were no vitals filed for this visit.   Subjective Assessment - 10/04/20 0813     Subjective "I feel like I'm getting my life back."    Currently in Pain? No/denies                   ADULT SLP TREATMENT - 10/04/20 0813       General Information   Behavior/Cognition Alert;Cooperative    HPI Tracey [name] is a 65 y.o. female with a PMHx of CAD s/p MI, cardiac arrest, HTN, hypothryoidism, HLD, and anxiety. Presented to [facility] ED with slurred speech and word finding difficulty. CTA head and neck showed a distal M2 occlusion in left MCA branch. Per chart tPA emergently stopped when increased dysarthria noted> emergent head CT showed no intracranial hemorrhage. Pt transferred to [facility] and underwent revascularization of occluded Lt MCA following left MCA embolism. Seen by SLP during acute stay with diagnosis of global aphasia; however verbal output  much improved by d/c on 04/20/20.      Treatment Provided   Treatment provided Cognitive-Linquistic      Cognitive-Linquistic Treatment   Treatment focused on Apraxia;Aphasia;Patient/family/caregiver education    Skilled Treatment Patient reported first two days of work went well; she felt she was able to get back on the computer and able to perform jobs on her task list without difficulty. She is excited to get back to work, has been attending ball games of her grandchildren, and is helping with [place] at church this evening. Pt reports she feels ready for d/c at this time. Discussed "unpredictable" communication breakdowns that still occur; pt agreed that by having plan in place and  knowing strategies that help her. Reviewed use of assistive technology for composing emails, documents.      Assessment / Recommendations / Plan   Plan Continue with current plan of care      Progression Toward Goals   Progression toward goals Progressing toward goals              SLP Education - 10/04/20 0814     Education Details back-up plan, use of strategies to build confidence in communication abilities    Person(s) Educated Patient    Methods Explanation    Comprehension Verbalized understanding              SLP Short Term Goals - 09/25/20 1556       SLP SHORT TERM GOAL #1   Title Patient will complete high-level wordfinding tasks >90% accuracy.    Time 10    Period --   sessions   Status Achieved      SLP SHORT TERM GOAL #2   Title Patient will read multisyllabic words using compensations for apraxia >90% accuracy.    Time 10    Period Weeks    Status Achieved      SLP SHORT TERM GOAL #3   Title Patient will demonstrate comprehension of paragraphs 8-10 sentences by answering questions >90% accuracy.    Time 10    Period --   sessions   Status Achieved      SLP SHORT TERM GOAL #4   Title Patient will use compensations for aphasia and apraxia in 5-8 minutes mod complex conversation and self-correct breakdowns >90% of the time with modified independence.    Time 10    Period --   sessions   Status Achieved              SLP Long Term Goals - 10/04/20 0816       SLP LONG TERM GOAL #1   Title Patient will report use of strategies for aphasia/apraxia to successfully make phone calls related to health, finances, and/or job functions.    Time 12    Period Weeks    Status Achieved      SLP LONG TERM GOAL #2   Title Patient will comprehend work-type emails 100% accuracy with use of compensations if needed (text-to-speech, etc).    Time 12    Period Weeks    Status Achieved      SLP LONG TERM GOAL #3   Title Patient will write work-type emails  and self-correct >95% of errors with compensations allowed (speech-to-text, text-to-speech, double-checking).    Time 12    Period Weeks    Status Achieved      SLP LONG TERM GOAL #4   Title Patient will use compensations for anomia and apraxia functionally in 20+ minutes complex conversation,  repairing breakdowns independently.    Time 12    Period Weeks    Status Achieved              Plan - 10/04/20 0840     Clinical Impression Statement Patient is compensating well for mild anomic aphasia/verbal apraxia. Reports successful return to the office, re-engagement with church and community activities. Demonstrating self-correction and use of compensations in complex conversation today. Aware of assistive technology to aid written composition. Patient reports pleased with current functional level and is in agreement with d/c today.    Speech Therapy Frequency 1x /week    Duration 12 weeks    Treatment/Interventions Language facilitation;Environmental controls;Cueing hierarchy;SLP instruction and feedback;Functional tasks;Compensatory strategies;Cognitive reorganization;Internal/external aids;Multimodal communcation approach;Patient/family education    Potential to Achieve Goals Good    SLP Home Exercise Plan see pt instructions    Consulted and Agree with Plan of Care Patient;Family member/caregiver    Family Member Consulted pt's daughter             Patient will benefit from skilled therapeutic intervention in order to improve the following deficits and impairments:   Aphasia  Verbal apraxia  Middle cerebral artery embolism, left    Problem List Patient Active Problem List   Diagnosis Date Noted   Acute stroke due to ischemia ([place]) 04/18/2020   Middle cerebral artery embolism, left 04/18/2020   Atherosclerosis of native arteries of extremity with intermittent claudication ([employer]) 10/05/2019   GERD (gastroesophageal reflux disease) 09/26/2019   Low serum vitamin D  08/25/2018   B12 deficiency 11/21/2015   Acquired hypothyroidism 08/09/2015   Cardiomyopathy, ischemic 08/09/2015   Hyperlipidemia, mixed 08/09/2015   Dizziness 06/13/2012   CAD (coronary artery disease) 06/13/2012   H/O irritable bowel syndrome 10/20/2011   SPEECH THERAPY DISCHARGE SUMMARY  Visits from Start of Care: 21  Current functional level related to goals / functional outcomes: Mod-complex to complex conversation is functional with use of compensations. Written communication functional with extended time, use of strategies and assistive technology.    Remaining deficits: Mild anomic aphasia, mild verbal apraxia   Education / Equipment: Compensatory aids, strategies, assistive technology   Patient agrees to discharge. Patient goals were met. Patient is being discharged due to meeting the stated rehab goals.Deneise Lever, [place], [place] Speech-Language Pathologist  Aliene Altes 10/04/2020, 8:42 AM  [place] MAIN [facility] SERVICES [address] [place], [place], [address] Phone: [phone]   Fax:  [phone]   Name: Tracey [name] MRN: [id number] Date of Birth: 11/24/1955

## 2020-10-10 ENCOUNTER — Encounter: Payer: 59 | Admitting: Speech Pathology

## 2020-10-10 LAB — CUP PACEART REMOTE DEVICE CHECK
Date Time Interrogation Session: 20220909162840
Implantable Pulse Generator Implant Date: 20220328

## 2020-10-12 ENCOUNTER — Encounter: Payer: 59 | Admitting: Obstetrics and Gynecology

## 2020-10-15 ENCOUNTER — Encounter: Payer: Self-pay | Admitting: Adult Health

## 2020-10-15 ENCOUNTER — Ambulatory Visit (INDEPENDENT_AMBULATORY_CARE_PROVIDER_SITE_OTHER): Payer: 59

## 2020-10-15 DIAGNOSIS — I639 Cerebral infarction, unspecified: Secondary | ICD-10-CM | POA: Diagnosis not present

## 2020-10-17 ENCOUNTER — Encounter: Payer: 59 | Admitting: Speech Pathology

## 2020-10-19 NOTE — Progress Notes (Signed)
[facility] Summary Report / Loop Recorder 

## 2020-10-23 ENCOUNTER — Encounter: Payer: 59 | Admitting: Speech Pathology

## 2020-10-30 ENCOUNTER — Encounter: Payer: 59 | Admitting: Speech Pathology

## 2020-11-06 ENCOUNTER — Encounter: Payer: 59 | Admitting: Speech Pathology

## 2020-11-12 ENCOUNTER — Ambulatory Visit (INDEPENDENT_AMBULATORY_CARE_PROVIDER_SITE_OTHER): Payer: 59

## 2020-11-12 DIAGNOSIS — I255 Ischemic cardiomyopathy: Secondary | ICD-10-CM

## 2020-11-13 ENCOUNTER — Encounter: Payer: 59 | Admitting: Speech Pathology

## 2020-11-13 LAB — CUP PACEART REMOTE DEVICE CHECK
Date Time Interrogation Session: 20221012162951
Implantable Pulse Generator Implant Date: 20220328

## 2020-11-20 ENCOUNTER — Encounter: Payer: 59 | Admitting: Speech Pathology

## 2020-11-21 NOTE — Progress Notes (Signed)
[facility] Summary Report / Loop Recorder 

## 2020-11-21 NOTE — Addendum Note (Signed)
Addended by: Douglass Rivers D on: 11/21/2020 09:47 AM   Modules accepted: Level of Service

## 2020-11-27 ENCOUNTER — Encounter: Payer: 59 | Admitting: Speech Pathology

## 2020-12-04 ENCOUNTER — Encounter: Payer: 59 | Admitting: Speech Pathology

## 2020-12-11 ENCOUNTER — Ambulatory Visit
Admission: RE | Admit: 2020-12-11 | Discharge: 2020-12-11 | Disposition: A | Discharge status: Home / Self Care | Payer: 59 | Source: Ambulatory Visit | Attending: Obstetrics and Gynecology | Admitting: Obstetrics and Gynecology

## 2020-12-11 ENCOUNTER — Ambulatory Visit (INDEPENDENT_AMBULATORY_CARE_PROVIDER_SITE_OTHER): Payer: 59 | Admitting: Adult Health

## 2020-12-11 ENCOUNTER — Encounter: Payer: Self-pay | Admitting: Adult Health

## 2020-12-11 ENCOUNTER — Encounter: Payer: 59 | Admitting: Speech Pathology

## 2020-12-11 ENCOUNTER — Other Ambulatory Visit: Payer: Self-pay

## 2020-12-11 VITALS — BP 106/76 | HR 69 | Ht 65.0 in | Wt 215.0 lb

## 2020-12-11 DIAGNOSIS — Z1231 Encounter for screening mammogram for malignant neoplasm of breast: Secondary | ICD-10-CM | POA: Insufficient documentation

## 2020-12-11 DIAGNOSIS — I69320 Aphasia following cerebral infarction: Secondary | ICD-10-CM | POA: Diagnosis not present

## 2020-12-11 DIAGNOSIS — I63512 Cerebral infarction due to unspecified occlusion or stenosis of left middle cerebral artery: Secondary | ICD-10-CM | POA: Diagnosis not present

## 2020-12-11 DIAGNOSIS — M542 Cervicalgia: Secondary | ICD-10-CM

## 2020-12-11 DIAGNOSIS — I10 Essential (primary) hypertension: Secondary | ICD-10-CM

## 2020-12-11 DIAGNOSIS — Z1239 Encounter for other screening for malignant neoplasm of breast: Secondary | ICD-10-CM

## 2020-12-11 DIAGNOSIS — E785 Hyperlipidemia, unspecified: Secondary | ICD-10-CM

## 2020-12-11 NOTE — Patient Instructions (Signed)
Continue clopidogrel 75 mg daily  and Crestor  for secondary stroke prevention  Continue to follow up with PCP regarding cholesterol and blood pressure management  Maintain strict control of hypertension with blood pressure goal below 130/90, diabetes with hemoglobin A1c goal below 6.5% and cholesterol with LDL cholesterol (bad cholesterol) goal below 70 mg/dL.   Look into doing drying needling - information regarding [facility] provided - If you would like to do dry needling with physical therapy, please let me know     Followup in the future with me in 6 months or call earlier if needed       Thank you for coming to see [place] at [facility]. I hope we have been able to provide you high quality care today.  You may receive a patient satisfaction survey over the next few weeks. We would appreciate your feedback and comments so that we may continue to improve ourselves and the health of our patients.

## 2020-12-11 NOTE — Progress Notes (Signed)
[name] [address] [place]. [place] [address] [phone]       STROKE FOLLOW UP NOTE  [name] Date of Birth:  [birth year] Medical Record Number:  [id number]   Reason for Referral: stroke follow up    SUBJECTIVE:   CHIEF COMPLAINT:  Chief Complaint  Patient presents with   Follow-up    Rm 3 alone Pt is well, she no longer has headaches but she is experiencing soreness in her head, like wearing glasses on her head is tender. Speech is improving and she is back to work.       HPI:   Update 12/11/2020 JM: Returns for 22-month stroke follow-up unaccompanied  Overall doing well since prior visit -denies new stroke/TIA symptoms Mild residual speech difficulties and mild delayed recall but overall improved since prior visit Completed therapy 9/7 and successfully returned back to work initially started part time and now full time since end of September - does have slight increased fatigue towards end of the day but otherwise no issues  Denies continued headaches but does report "soreness/tenderness" bilaterally posterior temporalis and occipital area and occasional pains that shoot from occipital area bilaterally. Does have increased stiffness/tightness in neck, shoulders and upper and lower trapezius.  Referred to PT for dry needling shortly after prior visit but was not called to schedule and she did not pursue.  Compliant on Plavix - mild bruising but no other side effects Compliant on Crestor -denies side effects Blood pressure today 106/76 Loop recorder has not shown atrial fibrillation thus far  No further concerns at this time    History provided for reference purposes only Update 08/29/2020 JM: Tracey [name] returns for 31-month stroke follow-up accompanied by her daughter, [name].  Greatest concern today is in regards to continued daily headaches and dizziness.  Continue daily headaches fluctuating in severity typically left parietal  region and right occipital.  She does endorse neck tension.  At times can be associated with photophobia and phonophobia.  Can worsen with increased stressors.  Reports dizziness present since her stroke but seem to worsen shortly after when she was switched from Celexa to Lexapro and more recently after starting Topamax as well as increased difficulty focusing and brain fog sensation.  Hx of ocular migraines possibly occurring 3 [place]  Continues to work with [facility] SLP for residual aphasia and apraxia with some improvement noted but can worsen with increased stress, emotion or fatigue.  Increased fatigue noted after high language demands.  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  She is aware that restrictions will need to be in place but believes her job will be able to accommodate her.  Compliant on Plavix and Crestor without associated side effects.  Blood pressure today 122/64.  Loop recorder has not shown atrial fibrillation thus far.  No further concerns at this time.  Initial visit 05/28/2020 JM: Tracey [name] is being seen for hospital follow-up accompanied by her husband, Audry Pili.   Reports continued speech difficulty including difficulty with reading and writing but has been making progress.  She is currently on short-term disability provided by Dr. Erlinda Hong during hospitalization and requests extension.  Previously working as an Optometrist.  She is also been experiencing daily headaches appear more tension related to have been present since her stroke.  She does have remote history of migraines without any recent migraine headaches and current headaches different from  prior migraines.  She has been using Tylenol daily without much benefit.  Denies new stroke/TIA symptoms  Completed 3 weeks DAPT and remains on Plavix alone without associated side effects Remains on Crestor without associated side effects Blood  pressure today 136/66 Loop recorder has not shown atrial fibrillation thus far  No further concerns at this time  Stroke admission 04/18/2020 Tracey [name] is a 65 y.o. female with history of CAD s/p MI and cardiac arrest with stentins 04/2008, restenting 11/2018, claudication, HTN, hypothryoidism, HLD, and anxiety who presented on 04/18/2020 with aphasia. She was at work and seem to go into the bathroom at 9:15 AM.  Later around 9:20 AM she was found on the floor aphasic and with some right-sided weakness.  She received tPA at [facility] with near the end of infusion staff thought that her speech worsened so tPA stopped. CTH negative for ICH and transferred to [facility].  Stroke work-up revealed left MCA M2 branch stroke secondary to large vessel disease vs cardioembolic source.  Underwent successful revascularization by IR for M2 occlusion.  Loop recorder placed.  LDL 51 on Crestor 40 mg daily.  HTN stable.  Other stroke risk factors include former tobacco use stopped after MI, family history of stroke and CAD with MI in 2010.  Recommended DAPT for 3 weeks and Plavix alone.  Residual aphasia and alexia.  Evaluated by therapies who recommended outpatient PT/SLP and discharged home in stable condition.   Stroke - Left MCA M2 branch stroke secondary to large vessel disease vs. cardioembolic source CT head Code Stroke - Small amount of white matter hypodensity within the posterior left frontal lobe. In the setting of an acute distal left M2 MCA branch occlusion CT head - No evidence of acute intracranial hemorrhage status post tPA. No demarcated cortical infarct. As before, there is subtle hypodensity within the posterior left frontal lobe white matter. In the setting of an acute distal left M2 MCA branch occlusion CTA head & neck - Occluded distal M2 left MCA branch vessel. Mild non-stenotic calcified plaque within the intracranial ICAs bilaterally. CT perfusion - The perfusion software identifies  a 16 mL region of hypoperfusion within the posterior left MCA territory utilizing the Tmax>6 seconds threshold. The perfusion software identifies no core infarct. Mismatch volume: 16 mL. MRI moderate size acute infarction in left parietal lobe with minimal additional small acute left parietal cortical infarcts.  No evidence of hemorrhagic transformation.  Mild chronic microvascular ischemic changes MRA head preserved but decreased flow related enhancement at the site of distal left M2/M3 MCA thrombus on prior CTA 2D Echo - EF:45-50%. No wall motion abnormalities. Mid to apical anteroseptal akinesis. DVT US - No evidence of DVT bilaterally TEE interatrial septum aneurysmal.  Bubble study positive consistent with small to moderate sized PFO; no evidence of thrombus Loop recorder placed LDL 51 HgbA1c 5.4 aspirin 81 mg daily prior to admission, now on aspirin 81 mg daily and clopidogrel 75 mg daily for 3 weeks then continue with Plavix alone. Therapy recommendations:  outpt PT/SLP Disposition:  home      ROS:   14 system review of systems performed and negative with exception of those listed in HPI  PMH:  Past Medical History:  Diagnosis Date   Anxiety    Bronchitis    CAD (coronary artery disease)    a. 04/2008: cariac arrest with prox LAD occ s/p stenting, arctic sun. b. 11/2008: restenosis s/p repeat stenting. c. 01/2011: abnormal stress test but cath  reportedly OK.   Dysplastic nevus 11/04/2007   Ant. chest. Slight to moderate atypia, extends to one edge.    Dysplastic nevus 01/03/2009   Left breast. Minimal atypia, close to margin.   Hypothyroid    Myocardial infarction [facility]) 2010   stents and cardiac rehab    Stroke [facility])     PSH:  Past Surgical History:  Procedure Laterality Date   ANGIOPLASTY  2010   2 stents (femoral)    BUBBLE STUDY  04/23/2020   Procedure: BUBBLE STUDY;  Surgeon: Geralynn Rile, MD;  Location: [place];  Service: Cardiovascular;;   IR CT  HEAD LTD  04/18/2020   IR PERCUTANEOUS ART THROMBECTOMY/INFUSION INTRACRANIAL INC DIAG ANGIO  04/18/2020   LOOP RECORDER INSERTION N/A 04/23/2020   Procedure: LOOP RECORDER INSERTION;  Surgeon: Vickie Epley, MD;  Location: [place] CV LAB;  Service: Cardiovascular;  Laterality: N/A;   RADIOLOGY WITH ANESTHESIA N/A 04/18/2020   Procedure: IR WITH ANESTHESIA;  Surgeon: Radiologist, Medication, MD;  Location: [place];  Service: Radiology;  Laterality: N/A;   TEE WITHOUT CARDIOVERSION N/A 04/23/2020   Procedure: TRANSESOPHAGEAL ECHOCARDIOGRAM (TEE);  Surgeon: Geralynn Rile, MD;  Location: [place];  Service: Cardiovascular;  Laterality: N/A;   TUBAL LIGATION      Social History:  Social History   Socioeconomic History   Marital status: Married    Spouse name: Not on file   Number of children: Not on file   Years of education: Not on file   Highest education level: Not on file  Occupational History   Not on file  Tobacco Use   Smoking status: Former    Packs/day: 1.50    Types: Cigarettes    Start date: 03/22/1973    Quit date: 04/27/2008    Years since quitting: 12.6   Smokeless tobacco: Never  Vaping Use   Vaping Use: Never used  Substance and Sexual Activity   Alcohol use: No    Alcohol/week: 0.0 standard drinks   Drug use: Never   Sexual activity: Yes    Birth control/protection: Post-menopausal  Other Topics Concern   Not on file  Social History Narrative   Not on file   Social Determinants of Health   Financial Resource Strain: Not on file  Food Insecurity: Not on file  Transportation Needs: Not on file  Physical Activity: Not on file  Stress: Not on file  Social Connections: Not on file  Intimate Partner Violence: Not on file    Family History:  Family History  Problem Relation Age of Onset   Diabetes Mother    Hypertension Mother    Stroke Maternal Grandmother    Diabetes Maternal Grandfather    Stroke Maternal Grandfather     Medications:    Current Outpatient Medications on File Prior to Visit  Medication Sig Dispense Refill   Cholecalciferol 50 MCG (2000 UT) TABS Take 2,000 Units by mouth daily.     clopidogrel (PLAVIX) 75 MG tablet Take 1 tablet (75 mg total) by mouth daily. 90 tablet 3   cyanocobalamin (,VITAMIN B-12,) 1000 MCG/ML injection Inject 1,000 mcg into the muscle every 30 (thirty) days.     escitalopram (LEXAPRO) 10 MG tablet Take by mouth.     levothyroxine (SYNTHROID) 75 MCG tablet Take 75 mcg by mouth daily.     metoprolol succinate (TOPROL-XL) 25 MG 24 hr tablet Take 25 mg by mouth daily.     nitroGLYCERIN (NITROSTAT) 0.4 MG SL tablet Place 1 tablet (  0.4 mg total) under the tongue every 5 (five) minutes as needed for chest pain. 25 tablet 3   pantoprazole (PROTONIX) 40 MG tablet Take 40 mg by mouth daily.     ramipril (ALTACE) 5 MG capsule Take 5 mg by mouth daily.     rosuvastatin (CRESTOR) 40 MG tablet Take 40 mg by mouth daily.     No current facility-administered medications on file prior to visit.    Allergies:   Allergies  Allergen Reactions   Sulfa Antibiotics Hives      OBJECTIVE:  Physical Exam  Vitals:   12/11/20 1013  BP: 106/76  Pulse: 69  Weight: 215 lb (97.5 kg)  Height: 5\' 5"  (1.651 m)     Body mass index is 35.78 kg/m. No results found.  General: well developed, well nourished,  very pleasant middle-age Caucasian female, seated, in no evident distress Head: head normocephalic and atraumatic.   Neck: supple with no carotid or supraclavicular bruits Cardiovascular: regular rate and rhythm, no murmurs Musculoskeletal: Tenderness over cervical paraspinal muscles and upper and lower traps Skin:  no rash/petichiae Vascular:  Normal pulses all extremities   Neurologic Exam Mental Status: Awake and fully alert. Mild aphasia and verbal apraxia - stable. Able to follow commands without difficulty.  Oriented to place and time. Recent and remote memory intact. Attention span,  concentration and fund of knowledge appropriate during visit. Mood and affect appropriate.  Cranial Nerves: Pupils equal, briskly reactive to light. Extraocular movements full without nystagmus. Visual fields full to confrontation. Hearing intact. Facial sensation intact. Face, tongue, palate moves normally and symmetrically.  Motor: Normal bulk and tone. Normal strength in all tested extremity muscles Sensory.: intact to touch , pinprick , position and vibratory sensation.  Coordination: Rapid alternating movements normal in all extremities. Finger-to-nose and heel-to-shin performed accurately bilaterally. Gait and Station: Arises from chair without difficulty. Stance is normal. Gait demonstrates normal stride length and balance without use of assistive device. Tandem walk and heel toe with mild difficulty.  Romberg negative. Reflexes: 1+ and symmetric. Toes downgoing.          ASSESSMENT: Tracey [name] is a 65 y.o. year old female presented with aphasia on 04/18/2020 with stroke work-up revealing left MCA M2 branch stroke s/p tPA and IR secondary to large vessel disease vs cardioembolic source s/p ILR. Vascular risk factors include +PFO on TEE, HTN, HLD, CAD, MI in 2010, former tobacco use (stopped after MI in 2010) and obesity.     PLAN:  L MCA stroke :  Residual deficit: Mild anomic aphasia/verbal apraxia - continue HEP as advised during therapy sessions. Returned back to work without difficulty Vascular headache: Resolved.  Continue to monitor.  Intolerant to topiramate Loop recorder has not shown atrial fibrillation thus far -personally reviewed monthly reports TEE evidence of small PFO not felt to be related or contributed to recent stroke continue clopidogrel 75 mg daily  and Crestor 40 mg daily for secondary stroke prevention.   Discussed secondary stroke prevention measures and importance of close PCP follow up for aggressive stroke risk factor management  Cervicalgia: likely  contributing to occipital and posterior temporalis tenderness.  No evidence of occipital neuralgia based on physical exam.  Discussed pursuing dry needling - information provided for [facility] but she will call if she would like to pursue referral for physical therapy.  No indication for imaging -no red flag symptoms or physical findings on exam HTN: BP goal <130/90.  Stable on current regimen per PCP  HLD: LDL goal <70. On Crestor 40 mg daily per PCP. Routine lipid panel monitored by PCP    Follow up in 6 months or call earlier if needed    CC:  PCP: Rusty Aus, MD    I spent 34 minutes of face-to-face and non-face-to-face time with patient.  This included previsit chart review, lab review, study review, electronic health record documentation, patient education and discussion regarding prior stroke, secondary stroke prevention measures and aggressive stroke risk factor management, review of loop recorder, residual deficits and return back to work, cervical tenderness and likely etiologies and further treatment options and answered all other questions to patient satisfaction   [name], AGNP-BC  [facility] [address] [place], [place] [address]  Phone [phone] Fax [phone] Note: This document was prepared with digital dictation and possible smart phrase technology. Any transcriptional errors that result from this process are unintentional.

## 2020-12-12 LAB — CUP PACEART REMOTE DEVICE CHECK
Date Time Interrogation Session: 20221114163139
Implantable Pulse Generator Implant Date: 20220328

## 2020-12-13 ENCOUNTER — Encounter: Payer: Self-pay | Admitting: Obstetrics and Gynecology

## 2020-12-17 ENCOUNTER — Ambulatory Visit (INDEPENDENT_AMBULATORY_CARE_PROVIDER_SITE_OTHER): Payer: 59

## 2020-12-17 DIAGNOSIS — I255 Ischemic cardiomyopathy: Secondary | ICD-10-CM | POA: Diagnosis not present

## 2020-12-18 ENCOUNTER — Encounter: Payer: 59 | Admitting: Speech Pathology

## 2020-12-25 ENCOUNTER — Encounter: Payer: 59 | Admitting: Speech Pathology

## 2020-12-26 NOTE — Progress Notes (Signed)
[facility] Summary Report / Loop Recorder 

## 2021-01-01 ENCOUNTER — Encounter: Payer: 59 | Admitting: Speech Pathology

## 2021-01-08 ENCOUNTER — Encounter: Payer: 59 | Admitting: Speech Pathology

## 2021-01-15 ENCOUNTER — Encounter: Payer: 59 | Admitting: Speech Pathology

## 2021-01-15 LAB — CUP PACEART REMOTE DEVICE CHECK
Date Time Interrogation Session: 20221217163034
Implantable Pulse Generator Implant Date: 20220328

## 2021-01-22 ENCOUNTER — Ambulatory Visit (INDEPENDENT_AMBULATORY_CARE_PROVIDER_SITE_OTHER): Payer: 59

## 2021-01-22 ENCOUNTER — Encounter: Payer: 59 | Admitting: Speech Pathology

## 2021-01-22 DIAGNOSIS — I255 Ischemic cardiomyopathy: Secondary | ICD-10-CM

## 2021-01-31 NOTE — Progress Notes (Signed)
[facility] Summary Report / Loop Recorder 

## 2021-02-25 ENCOUNTER — Ambulatory Visit (INDEPENDENT_AMBULATORY_CARE_PROVIDER_SITE_OTHER): Payer: 59

## 2021-02-25 DIAGNOSIS — I255 Ischemic cardiomyopathy: Secondary | ICD-10-CM

## 2021-02-25 LAB — CUP PACEART REMOTE DEVICE CHECK
Date Time Interrogation Session: 20230129231428
Implantable Pulse Generator Implant Date: 20220328

## 2021-03-05 NOTE — Progress Notes (Signed)
[facility] Summary Report / Loop Recorder 

## 2021-04-01 LAB — CUP PACEART REMOTE DEVICE CHECK
Date Time Interrogation Session: 20230303230909
Implantable Pulse Generator Implant Date: 20220328

## 2021-04-05 ENCOUNTER — Telehealth: Payer: Self-pay

## 2021-04-05 NOTE — Telephone Encounter (Signed)
The patient states she wants to cancel all upcoming remotes because her insurance is not covering the remotes. She is having to pay over $200 per remote. I let the patient know she may need to come into office to have her device check. I told her I will let Dr. Quentin Ore know.  ?

## 2021-05-02 ENCOUNTER — Ambulatory Visit (INDEPENDENT_AMBULATORY_CARE_PROVIDER_SITE_OTHER): Payer: 59

## 2021-05-02 DIAGNOSIS — I255 Ischemic cardiomyopathy: Secondary | ICD-10-CM | POA: Diagnosis not present

## 2021-05-02 LAB — CUP PACEART REMOTE DEVICE CHECK
Date Time Interrogation Session: 20230405231047
Implantable Pulse Generator Implant Date: 20220328

## 2021-05-09 ENCOUNTER — Encounter: Payer: Self-pay | Admitting: Cardiovascular Disease

## 2021-05-09 DIAGNOSIS — E782 Mixed hyperlipidemia: Secondary | ICD-10-CM

## 2021-05-09 NOTE — Telephone Encounter (Signed)
Per Dr. Acie Fredrickson: discontinued Rosuvastatin and ordered referral to lipid clinic. ? ?Patient made aware by Dr. Acie Fredrickson in previous message. ?

## 2021-05-17 NOTE — Progress Notes (Signed)
[facility] Summary Report / Loop Recorder 

## 2021-06-05 ENCOUNTER — Ambulatory Visit (INDEPENDENT_AMBULATORY_CARE_PROVIDER_SITE_OTHER): Payer: 59 | Admitting: Pharmacist

## 2021-06-05 DIAGNOSIS — E782 Mixed hyperlipidemia: Secondary | ICD-10-CM | POA: Diagnosis not present

## 2021-06-05 DIAGNOSIS — I25118 Atherosclerotic heart disease of native coronary artery with other forms of angina pectoris: Secondary | ICD-10-CM | POA: Diagnosis not present

## 2021-06-05 MED ORDER — ROSUVASTATIN CALCIUM 5 MG PO TABS
5.0000 mg | ORAL_TABLET | Freq: Every day | ORAL | 5 refills | Status: DC
Start: 2021-06-05 — End: 2021-06-14

## 2021-06-05 NOTE — Patient Instructions (Addendum)
It was great to meet you today! ? ?Instructions ?Stop taking Crestor (rosuvastatin) 40 mg for a couple days until your muscle aches go away.  ?Start taking Crestor (rosuvastatin) 5 mg daily ?Call us if you have any muscle pain or leg weakness. [phone] ?Go to [facility] near your home to have your fasting labs drawn in about 1 month.  ? ? ? ? ?

## 2021-06-05 NOTE — Progress Notes (Signed)
Patient ID: Tracey [name]                 DOB: 1955-07-22                    MRN: [id number] ? ? ? ? ?HPI: ?Tracey [name] is a 66 y.o. female patient referred to lipid clinic by Dr. Acie Fredrickson. PMH is significant for CAD (s/p anterior MI with stent placement in 2010), ischemic cardiomyopathy (EF 50-55% on 04/23/20), stroke (2022), PAD, HLD, prediabetes, hypothyroidism, GERD. Patient recently developed myalgias and leg weakness on rosuvastatin 40 mg daily, which resolved after discontinuing the medication for 10 days. She attempted to restart a lower dose of rosuvastatin with CoQ10, which made her sick. Of note, patient was originally started on atorvastatin after her MI, which she states did not lower her LDL, but did not cause leg pain per memory. She was instructed to discontinue rosuvastatin in April 2023 and follow-up in lipid clinic.  ? ?Patient presents today for lipid management. She reports that she has continued her rosuvastatin 40 mg despite leg weakness and pain, as she is very motivated to prevent future events. She has always attributed muscle weakness to "being unhealthy," and was surprised when her 10-day break from rosuvastatin eliminated her weakness and aches immediately. She regularly has difficulties pulling herself up from the toilet and walking up stairs, which negatively impacts her QoL. Of note, pt husband helps manage her medications so pt is unsure what dose of rosuvastatin she re-trialed with CoQ10, but she believes she was cutting her '40mg'$  tablets in half. She reports she was not on this dose long enough to determine whether she had recurrent myalgias. Pt interested in injectable medication for cholesterol that would not cause muscle aches, but is also willing to re-trial a low dose of a statin.  ? ?Current Medications: none ?Intolerances: rosuvastatin '40mg'$  daily - leg weakness and pain (reported April 2023) ?Risk Factors: progressive premature ASCVD (MI, stroke, PAD) ?LDL goal: <55  mg/dL ? ?Family History: Diabetes in her maternal grandfather and mother; Hypertension in her mother; Stroke in her maternal grandfather and maternal grandmother. ? ?Social History: Former smoker (52.5 pack-year hx), quit 2010. Denies alcohol or drug use.  ? ?Labs: ?03/08/21: TC 126, TG 84, HDL 58.4, Chol/HDL 2.2, LDL-calc 51, VLDL 17 (on rosuvastatin 40 mg) ?04/19/20: TC 135, TG 55, HDL 73, Chol/HDL 1.8, VLDL 11, LDL-calc 51 (on rosuvastatin 40 mg) ? ?Past Medical History:  ?Diagnosis Date  ? Anxiety   ? Bronchitis   ? CAD (coronary artery disease)   ? a. 04/2008: cariac arrest with prox LAD occ s/p stenting, arctic sun. b. 11/2008: restenosis s/p repeat stenting. c. 01/2011: abnormal stress test but cath reportedly OK.  ? Dysplastic nevus 11/04/2007  ? Ant. chest. Slight to moderate atypia, extends to one edge.   ? Dysplastic nevus 01/03/2009  ? Left breast. Minimal atypia, close to margin.  ? Hypothyroid   ? Myocardial infarction [facility] 2010  ? stents and cardiac rehab   ? Stroke [facility])   ? ? ?Current Outpatient Medications on File Prior to Visit  ?Medication Sig Dispense Refill  ? Cholecalciferol 50 MCG (2000 UT) TABS Take 2,000 Units by mouth daily.    ? clopidogrel (PLAVIX) 75 MG tablet Take 1 tablet (75 mg total) by mouth daily. 90 tablet 3  ? cyanocobalamin (,VITAMIN B-12,) 1000 MCG/ML injection Inject 1,000 mcg into the muscle every 30 (thirty) days.    ? escitalopram (LEXAPRO)  10 MG tablet Take by mouth.    ? levothyroxine (SYNTHROID) 75 MCG tablet Take 75 mcg by mouth daily.    ? metoprolol succinate (TOPROL-XL) 25 MG 24 hr tablet Take 25 mg by mouth daily.    ? nitroGLYCERIN (NITROSTAT) 0.4 MG SL tablet Place 1 tablet (0.4 mg total) under the tongue every 5 (five) minutes as needed for chest pain. 25 tablet 3  ? pantoprazole (PROTONIX) 40 MG tablet Take 40 mg by mouth daily.    ? ramipril (ALTACE) 5 MG capsule Take 5 mg by mouth daily.    ? ?No current facility-administered medications on file prior to visit.   ? ? ?Allergies  ?Allergen Reactions  ? Sulfa Antibiotics Hives  ? ? ?Assessment/Plan: ? ?1. Hyperlipidemia - LDL-C well controlled at 51 mg/dL at goal <55 mg/dL given premature ASCVD with multiple ACS events. Pt likely experiencing statin-induced myalgias and muscle weakness given immediate remission and return of symptoms with stopping and restarting rosuvastatin 40 mg. Because pt has responded well to rosuvastatin 40 mg, and it is generally better tolerated than atorvastatin, suggested restarting moderate-intensity dose of rosuvastatin with close monitoring for myalgias. Pt amenable to plan. Pt instructed to stop rosuvastatin 40 mg for a few days until muscle aches dissipate, then start rosuvastatin 5 mg daily. If patient does not report myalgias, she will repeat her lipid panel in 1 month at [facility] (pt preference, closer to home). Depending on response, will either titrate rosuvastatin, add ezetimibe 10 mg daily, or start PCSK9i if pt is >20% above LDL-C goal. Will attempt to maintain pt on maximally tolerated dose of statin for maximal CV benefit.  ? ?Patient seen with Park Liter, PY4 PharmD Candidate ? ?Megan E. Supple, PharmD, BCACP, CPP ?[place] [place], [place] [address] ?Phone: [phone]; Fax: [phone] ?06/05/2021 10:21 AM ? ? ?

## 2021-06-10 ENCOUNTER — Ambulatory Visit (INDEPENDENT_AMBULATORY_CARE_PROVIDER_SITE_OTHER): Payer: 59 | Admitting: Adult Health

## 2021-06-10 ENCOUNTER — Encounter: Payer: Self-pay | Admitting: Adult Health

## 2021-06-10 VITALS — BP 121/71 | HR 67 | Ht 65.0 in | Wt 220.0 lb

## 2021-06-10 DIAGNOSIS — I69320 Aphasia following cerebral infarction: Secondary | ICD-10-CM

## 2021-06-10 DIAGNOSIS — I63512 Cerebral infarction due to unspecified occlusion or stenosis of left middle cerebral artery: Secondary | ICD-10-CM | POA: Diagnosis not present

## 2021-06-10 NOTE — Progress Notes (Signed)
?[name] ?[address] ?[place]. [place] [address] ?[phone] ? ?     STROKE FOLLOW UP NOTE ? ?[name] ?Date of Birth:  May 08, 1955 ?Medical Record Number:  [id number]  ? ?Reason for Referral: stroke follow up ? ? ? ?SUBJECTIVE: ? ? ?CHIEF COMPLAINT:  ?Chief Complaint  ?Patient presents with  ? Follow-up  ?  Rm 3 alone here for 6 month f/u- Reports she is back at work and has trouble with numbers and reading at times. She is planning on retiring in March of 2024. She also reports head pain when she puts sun glasses on top of her head.   ? ? ? ? ?HPI:  ? ?Update 06/10/2021 Tracey [name]: Patient returns for 55-monthstroke follow-up.  Overall stable since prior visit without new stroke/TIA symptoms.  Reports residual mild speech difficulty and at times difficulty with numbers and reading.  She is currently working full-time but plans on retiring in March 2024.  ? ?She does mention at times will have pain/tenderness on top of head when she places her sunglasses on top of her head.  She does not have any issues with pain on side of head or back of head.  ? ?Compliant on Plavix without side effects.  Noted difficulty tolerating Crestor 40 mg daily due to myalgias which resolved after stopping, had f/u with cardiology last week, recommended restarting low-dose Crestor at 5 mg daily which was started 2 days ago, has not had any issues since restarting.  Cardiology plans on rechecking labs in 4 weeks.  Blood pressure today 121/71.  Loop recorder has not shown atrial fibrillation thus far.  No further concerns at this time. ? ? ? ? ?History provided for reference purposes only ?Update 12/11/2020 Tracey [name]: Returns for 320-monthtroke follow-up unaccompanied ? ?Overall doing well since prior visit -denies new stroke/TIA symptoms ?Mild residual speech difficulties and mild delayed recall but overall improved since prior visit ?Completed therapy 9/7 and successfully returned back to work initially started part  time and now full time since end of September - does have slight increased fatigue towards end of the day but otherwise no issues ? ?Denies continued headaches but does report "soreness/tenderness" bilaterally posterior temporalis and occipital area and occasional pains that shoot from occipital area bilaterally. Does have increased stiffness/tightness in neck, shoulders and upper and lower trapezius.  Referred to PT for dry needling shortly after prior visit but was not called to schedule and she did not pursue. ? ?Compliant on Plavix - mild bruising but no other side effects ?Compliant on Crestor -denies side effects ?Blood pressure today 106/76 ?Loop recorder has not shown atrial fibrillation thus far ? ?No further concerns at this time ? ? ?Update 08/29/2020 Tracey [name]: Mrs. HaAlamilloeturns for 3-62-monthroke follow-up accompanied by her daughter, Tracey MercuryGreatest concern today is in regards to continued daily headaches and dizziness.  Continue daily headaches fluctuating in severity typically left parietal region and right occipital.  She does endorse neck tension.  At times can be associated with photophobia and phonophobia.  Can worsen with increased stressors.  Reports dizziness present since her stroke but seem to worsen shortly after when she was switched from Celexa to Lexapro and more recently after starting Topamax as well as increased difficulty focusing and brain fog sensation.  Hx of ocular migraines possibly occurring 3 [place]  [facility] residual aphasia and apraxia with some improvement noted  but can worsen with increased stress, emotion or fatigue.  Increased fatigue noted after high language demands.  She is currently on short-term disability but questions possibly returning back to work as an Tracey [name].  She is aware that restrictions will need to be in place but believes her job will be able to accommodate her.  Compliant on Plavix and Crestor  without associated side effects.  Blood pressure today 122/64.  Loop recorder has not shown atrial fibrillation thus far.  No further concerns at this time. ? ?Initial visit 05/28/2020 Tracey [name]: Tracey [name] is being seen for hospital follow-up accompanied by her husband, Tracey [name].  ? ?Reports continued speech difficulty including difficulty with reading and writing but has been making progress.  She is currently on short-term disability provided by Dr. Erlinda [name] during hospitalization and requests extension.  Previously working as an Tracey [name].  She is also been experiencing daily headaches appear more tension related to have been present since her stroke.  She does have remote history of migraines without any recent migraine headaches and current headaches different from prior migraines.  She has been using Tylenol daily without much benefit.  Denies new stroke/TIA symptoms ? ?Completed 3 weeks DAPT and remains on Plavix alone without associated side effects ?Remains on Crestor without associated side effects ?Blood pressure today 136/66 ?Loop recorder has not shown atrial fibrillation thus far ? ?No further concerns at this time ? ?Stroke admission 04/18/2020 ?Tracey [name] is a 66 y.o. female with history of CAD s/p MI and cardiac arrest with stentins 04/2008, restenting 11/2018, claudication, HTN, hypothryoidism, HLD, and anxiety who presented on 04/18/2020 with aphasia. She was at work and seem to go into the bathroom at 9:15 AM.  Later around 9:20 AM she was found on the floor aphasic and with some right-sided weakness.  She received tPA at [facility] with near the end of infusion staff thought that her speech worsened so tPA stopped. CTH negative for ICH and transferred to [facility].  Stroke work-up revealed left MCA M2 branch stroke secondary to large vessel disease vs cardioembolic source.  Underwent successful revascularization by IR for M2 occlusion.  Loop recorder placed.  LDL 51 on Crestor 40 mg daily.  HTN  stable.  Other stroke risk factors include former tobacco use stopped after MI, family history of stroke and CAD with MI in 2010.  Recommended DAPT for 3 weeks and Plavix alone.  Residual aphasia and alexia.  Evaluated by therapies who recommended outpatient PT/SLP and discharged home in stable condition. ? ? ?Stroke - Left MCA M2 branch stroke secondary to large vessel disease vs. cardioembolic source ?CT head Code Stroke - Small amount of white matter hypodensity within the posterior left frontal lobe. In the setting of an acute distal left M2 MCA branch occlusion ?CT head - No evidence of acute intracranial hemorrhage status post tPA. No demarcated cortical infarct. As before, there is subtle hypodensity within the posterior left frontal lobe white matter. In the setting of an acute distal left M2 MCA branch occlusion ?CTA head & neck - Occluded distal M2 left MCA branch vessel. Mild non-stenotic calcified plaque within the intracranial ICAs bilaterally. ?CT perfusion - The perfusion software identifies a 16 mL region of hypoperfusion within the posterior left MCA territory utilizing the Tmax>6 seconds threshold. The perfusion software identifies no core infarct. Mismatch volume: 16 mL. ?MRI moderate size acute infarction in left parietal lobe with minimal additional small acute left parietal cortical infarcts.  No evidence of hemorrhagic  transformation.  Mild chronic microvascular ischemic changes ?MRA head preserved but decreased flow related enhancement at the site of distal left M2/M3 MCA thrombus on prior CTA ?2D Echo - EF:45-50%. No wall motion abnormalities. Mid to apical anteroseptal akinesis. ?DVT US - No evidence of DVT bilaterally ?TEE interatrial septum aneurysmal.  Bubble study positive consistent with small to moderate sized PFO; no evidence of thrombus ?Loop recorder placed ?LDL 51 ?HgbA1c 5.4 ?aspirin 81 mg daily prior to admission, now on aspirin 81 mg daily and clopidogrel 75 mg daily for 3  weeks then continue with Plavix alone. ?Therapy recommendations:  outpt PT/SLP ?Disposition:  home ? ? ? ? ? ?ROS:   ?14 system review of systems performed and negative with exception of those listed in HPI ? ?P

## 2021-06-10 NOTE — Patient Instructions (Signed)
Continue clopidogrel 75 mg daily  and Crestor  for secondary stroke prevention ? ?Ensure follow up with cardiology for monitoring of loop recorder  ? ?Continue to follow up with PCP regarding cholesterol and blood pressure management  ?Maintain strict control of hypertension with blood pressure goal below 130/90 and cholesterol with LDL cholesterol (bad cholesterol) goal below 70 mg/dL.  ? ?Signs of a Stroke? Follow the BEFAST method:  ?Balance Watch for a sudden loss of balance, trouble with coordination or vertigo ?Eyes Is there a sudden loss of vision in one or both eyes? Or double vision?  ?Face: Ask the person to smile. Does one side of the face droop or is it numb?  ?Arms: Ask the person to raise both arms. Does one arm drift downward? Is there weakness or numbness of a leg? ?Speech: Ask the person to repeat a simple phrase. Does the speech sound slurred/strange? Is the person confused ? ?Time: If you observe any of these signs, call 911. ? ? ? ? ? ? ? ?Thank you for coming to see [place] at [facility]. I hope we have been able to provide you high quality care today. ? ?You may receive a patient satisfaction survey over the next few weeks. We would appreciate your feedback and comments so that we may continue to improve ourselves and the health of our patients. ? ?

## 2021-06-13 ENCOUNTER — Observation Stay (HOSPITAL_COMMUNITY): Payer: 59

## 2021-06-13 ENCOUNTER — Observation Stay (HOSPITAL_BASED_OUTPATIENT_CLINIC_OR_DEPARTMENT_OTHER): Payer: 59

## 2021-06-13 ENCOUNTER — Observation Stay (HOSPITAL_COMMUNITY)
Admission: EM | Admit: 2021-06-13 | Discharge: 2021-06-13 | Disposition: A | Discharge status: Home / Self Care | Payer: 59 | Attending: Internal Medicine | Admitting: Internal Medicine

## 2021-06-13 ENCOUNTER — Emergency Department (HOSPITAL_COMMUNITY): Payer: 59

## 2021-06-13 ENCOUNTER — Other Ambulatory Visit: Payer: Self-pay

## 2021-06-13 DIAGNOSIS — G459 Transient cerebral ischemic attack, unspecified: Principal | ICD-10-CM | POA: Insufficient documentation

## 2021-06-13 DIAGNOSIS — Z20822 Contact with and (suspected) exposure to covid-19: Secondary | ICD-10-CM | POA: Diagnosis not present

## 2021-06-13 DIAGNOSIS — E785 Hyperlipidemia, unspecified: Secondary | ICD-10-CM | POA: Diagnosis not present

## 2021-06-13 DIAGNOSIS — R4701 Aphasia: Secondary | ICD-10-CM

## 2021-06-13 DIAGNOSIS — Z8673 Personal history of transient ischemic attack (TIA), and cerebral infarction without residual deficits: Secondary | ICD-10-CM | POA: Diagnosis not present

## 2021-06-13 DIAGNOSIS — Z95820 Peripheral vascular angioplasty status with implants and grafts: Secondary | ICD-10-CM | POA: Insufficient documentation

## 2021-06-13 DIAGNOSIS — E782 Mixed hyperlipidemia: Secondary | ICD-10-CM

## 2021-06-13 DIAGNOSIS — I10 Essential (primary) hypertension: Secondary | ICD-10-CM | POA: Diagnosis not present

## 2021-06-13 DIAGNOSIS — Z7902 Long term (current) use of antithrombotics/antiplatelets: Secondary | ICD-10-CM | POA: Diagnosis not present

## 2021-06-13 DIAGNOSIS — E039 Hypothyroidism, unspecified: Secondary | ICD-10-CM

## 2021-06-13 DIAGNOSIS — I25118 Atherosclerotic heart disease of native coronary artery with other forms of angina pectoris: Secondary | ICD-10-CM | POA: Diagnosis not present

## 2021-06-13 DIAGNOSIS — Z79899 Other long term (current) drug therapy: Secondary | ICD-10-CM | POA: Insufficient documentation

## 2021-06-13 DIAGNOSIS — I2510 Atherosclerotic heart disease of native coronary artery without angina pectoris: Secondary | ICD-10-CM | POA: Insufficient documentation

## 2021-06-13 DIAGNOSIS — Z6837 Body mass index (BMI) 37.0-37.9, adult: Secondary | ICD-10-CM | POA: Insufficient documentation

## 2021-06-13 DIAGNOSIS — E669 Obesity, unspecified: Secondary | ICD-10-CM | POA: Diagnosis not present

## 2021-06-13 DIAGNOSIS — I639 Cerebral infarction, unspecified: Secondary | ICD-10-CM | POA: Diagnosis not present

## 2021-06-13 DIAGNOSIS — R4702 Dysphasia: Secondary | ICD-10-CM | POA: Diagnosis present

## 2021-06-13 DIAGNOSIS — Z87891 Personal history of nicotine dependence: Secondary | ICD-10-CM | POA: Insufficient documentation

## 2021-06-13 LAB — DIFFERENTIAL
Abs Immature Granulocytes: 0.01 10*3/uL (ref 0.00–0.07)
Basophils Absolute: 0 10*3/uL (ref 0.0–0.1)
Basophils Relative: 1 %
Eosinophils Absolute: 0.1 10*3/uL (ref 0.0–0.5)
Eosinophils Relative: 2 %
Immature Granulocytes: 0 %
Lymphocytes Relative: 33 %
Lymphs Abs: 1.5 10*3/uL (ref 0.7–4.0)
Monocytes Absolute: 0.3 10*3/uL (ref 0.1–1.0)
Monocytes Relative: 7 %
Neutro Abs: 2.6 10*3/uL (ref 1.7–7.7)
Neutrophils Relative %: 57 %

## 2021-06-13 LAB — COMPREHENSIVE METABOLIC PANEL
ALT: 17 U/L (ref 0–44)
AST: 20 U/L (ref 15–41)
Albumin: 3.4 g/dL — ABNORMAL LOW (ref 3.5–5.0)
Alkaline Phosphatase: 54 U/L (ref 38–126)
Anion gap: 7 (ref 5–15)
BUN: 16 mg/dL (ref 8–23)
CO2: 25 mmol/L (ref 22–32)
Calcium: 8.9 mg/dL (ref 8.9–10.3)
Chloride: 106 mmol/L (ref 98–111)
Creatinine, Ser: 0.95 mg/dL (ref 0.44–1.00)
GFR, Estimated: >60 mL/min (ref >60)
Glucose, Bld: 110 mg/dL — ABNORMAL HIGH (ref 70–99)
Potassium: 4.1 mmol/L (ref 3.5–5.1)
Sodium: 138 mmol/L (ref 135–145)
Total Bilirubin: 0.5 mg/dL (ref 0.3–1.2)
Total Protein: 6.6 g/dL (ref 6.5–8.1)

## 2021-06-13 LAB — CBC
HCT: 40.1 % (ref 36.0–46.0)
Hemoglobin: 12.8 g/dL (ref 12.0–15.0)
MCH: 27.8 pg (ref 26.0–34.0)
MCHC: 31.9 g/dL (ref 30.0–36.0)
MCV: 87.2 fL (ref 80.0–100.0)
Platelets: 230 10*3/uL (ref 150–400)
RBC: 4.6 MIL/uL (ref 3.87–5.11)
RDW: 13.7 % (ref 11.5–15.5)
WBC: 4.5 10*3/uL (ref 4.0–10.5)
nRBC: 0 % (ref 0.0–0.2)

## 2021-06-13 LAB — ECHOCARDIOGRAM COMPLETE
Area-P 1/2: 2.91 cm2
Height: 65 in
S' Lateral: 2.9 cm
Weight: 3576.74 oz

## 2021-06-13 LAB — URINALYSIS, ROUTINE W REFLEX MICROSCOPIC
Bilirubin Urine: NEGATIVE
Glucose, UA: NEGATIVE mg/dL
Hgb urine dipstick: NEGATIVE
Ketones, ur: NEGATIVE mg/dL
Leukocytes,Ua: NEGATIVE
Nitrite: NEGATIVE
Protein, ur: NEGATIVE mg/dL
Specific Gravity, Urine: 1.004 — ABNORMAL LOW (ref 1.005–1.030)
pH: 6 (ref 5.0–8.0)

## 2021-06-13 LAB — RESP PANEL BY RT-PCR (FLU A&B, COVID) ARPGX2
Influenza A by PCR: NEGATIVE
Influenza B by PCR: NEGATIVE
SARS Coronavirus 2 by RT PCR: NEGATIVE

## 2021-06-13 LAB — RAPID URINE DRUG SCREEN, HOSP PERFORMED
Amphetamines: NOT DETECTED
Barbiturates: NOT DETECTED
Benzodiazepines: NOT DETECTED
Cocaine: NOT DETECTED
Opiates: NOT DETECTED
Tetrahydrocannabinol: NOT DETECTED

## 2021-06-13 LAB — LIPID PANEL
Cholesterol: 160 mg/dL (ref 0–200)
HDL: 56 mg/dL (ref >40)
LDL Cholesterol: 84 mg/dL (ref 0–99)
Total CHOL/HDL Ratio: 2.9 RATIO
Triglycerides: 98 mg/dL (ref <150)
VLDL: 20 mg/dL (ref 0–40)

## 2021-06-13 LAB — CBG MONITORING, ED: Glucose-Capillary: 99 mg/dL (ref 70–99)

## 2021-06-13 LAB — I-STAT CHEM 8, ED
BUN: 18 mg/dL (ref 8–23)
Calcium, Ion: 1.12 mmol/L — ABNORMAL LOW (ref 1.15–1.40)
Chloride: 105 mmol/L (ref 98–111)
Creatinine, Ser: 0.9 mg/dL (ref 0.44–1.00)
Glucose, Bld: 107 mg/dL — ABNORMAL HIGH (ref 70–99)
HCT: 40 % (ref 36.0–46.0)
Hemoglobin: 13.6 g/dL (ref 12.0–15.0)
Potassium: 4.1 mmol/L (ref 3.5–5.1)
Sodium: 140 mmol/L (ref 135–145)
TCO2: 28 mmol/L (ref 22–32)

## 2021-06-13 LAB — HEMOGLOBIN A1C
Hgb A1c MFr Bld: 5.4 % (ref 4.8–5.6)
Mean Plasma Glucose: 108.28 mg/dL

## 2021-06-13 LAB — PROTIME-INR
INR: 0.9 (ref 0.8–1.2)
Prothrombin Time: 11.7 seconds (ref 11.4–15.2)

## 2021-06-13 LAB — ETHANOL: Alcohol, Ethyl (B): <10 mg/dL (ref <10)

## 2021-06-13 LAB — APTT: aPTT: 24 seconds (ref 24–36)

## 2021-06-13 MED ORDER — IOHEXOL 350 MG/ML SOLN
100.0000 mL | Freq: Once | INTRAVENOUS | Status: AC | PRN
  Administered 2021-06-13: 60 mL via INTRAVENOUS

## 2021-06-13 MED ORDER — SENNOSIDES-DOCUSATE SODIUM 8.6-50 MG PO TABS: 1.0000 | ORAL_TABLET | Freq: Every evening | ORAL | Status: DC | PRN

## 2021-06-13 MED ORDER — ASPIRIN 81 MG PO TBEC: 81.0000 mg | DELAYED_RELEASE_TABLET | Freq: Every day | ORAL | 12 refills | Status: DC

## 2021-06-13 MED ORDER — ASPIRIN 325 MG PO TBEC
650.0000 mg | DELAYED_RELEASE_TABLET | Freq: Once | ORAL | Status: AC
  Administered 2021-06-13: 650 mg via ORAL
  Filled 2021-06-13: qty 2

## 2021-06-13 MED ORDER — SODIUM CHLORIDE 0.9 % IV SOLN: Freq: Once | INTRAVENOUS | Status: AC

## 2021-06-13 MED ORDER — ACETAMINOPHEN 160 MG/5ML PO SOLN: 650.0000 mg | ORAL | Status: DC | PRN

## 2021-06-13 MED ORDER — STROKE: EARLY STAGES OF RECOVERY BOOK
Freq: Once | Status: AC
  Filled 2021-06-13: qty 1

## 2021-06-13 MED ORDER — ACETAMINOPHEN 650 MG RE SUPP: 650.0000 mg | RECTAL | Status: DC | PRN

## 2021-06-13 MED ORDER — PANTOPRAZOLE SODIUM 40 MG PO TBEC
40.0000 mg | DELAYED_RELEASE_TABLET | Freq: Every day | ORAL | Status: DC
  Administered 2021-06-13: 40 mg via ORAL
  Filled 2021-06-13: qty 1

## 2021-06-13 MED ORDER — ESCITALOPRAM OXALATE 10 MG PO TABS: 10.0000 mg | ORAL_TABLET | Freq: Every day | ORAL | Status: DC

## 2021-06-13 MED ORDER — PERFLUTREN LIPID MICROSPHERE
1.0000 mL | INTRAVENOUS | Status: AC | PRN
  Administered 2021-06-13: 1.5 mL via INTRAVENOUS

## 2021-06-13 MED ORDER — ACETAMINOPHEN 325 MG PO TABS: 650.0000 mg | ORAL_TABLET | ORAL | Status: DC | PRN

## 2021-06-13 MED ORDER — CLOPIDOGREL BISULFATE 75 MG PO TABS
75.0000 mg | ORAL_TABLET | Freq: Every day | ORAL | Status: DC
  Administered 2021-06-13: 75 mg via ORAL
  Filled 2021-06-13: qty 1

## 2021-06-13 MED ORDER — ROSUVASTATIN CALCIUM 5 MG PO TABS
5.0000 mg | ORAL_TABLET | Freq: Every day | ORAL | Status: DC
  Administered 2021-06-13: 5 mg via ORAL
  Filled 2021-06-13: qty 1

## 2021-06-13 MED ORDER — ASPIRIN 81 MG PO TBEC: 81.0000 mg | DELAYED_RELEASE_TABLET | Freq: Every day | ORAL | Status: DC

## 2021-06-13 MED ORDER — LEVOTHYROXINE SODIUM 75 MCG PO TABS: 75.0000 ug | ORAL_TABLET | Freq: Every day | ORAL | Status: DC

## 2021-06-13 MED ORDER — ENOXAPARIN SODIUM 40 MG/0.4ML IJ SOSY
40.0000 mg | PREFILLED_SYRINGE | INTRAMUSCULAR | Status: DC
  Filled 2021-06-13: qty 0.4

## 2021-06-13 NOTE — Progress Notes (Signed)
OT Cancellation Note  Patient Details Name: Tracey [name] MRN: [id number] DOB: 11-15-1955   Cancelled Treatment:    Reason Eval/Treat Not Completed: OT screened, no needs identified, will sign off  Beldon Nowling,HILLARY 06/13/2021, 2:22 PM Maurie Boettcher, OT/L   Acute OT Clinical Specialist Acute Rehabilitation Services Pager [phone] Office [phone]

## 2021-06-13 NOTE — ED Notes (Signed)
Pt returned from mri

## 2021-06-13 NOTE — H&P (Signed)
History and Physical    Patient: Tracey [name] EPP:[id number] DOB: 1955/10/13 DOA: 06/13/2021 DOS: the patient was seen and examined on 06/13/2021 PCP: Rusty Aus, MD  Patient coming from: Home via EMS  Chief Complaint:  Chief Complaint  Patient presents with   Code Stroke   HPI: Tracey [name] is a 66 y.o. 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  When she woke up this morning around 6:45 AM she was in her normal state of health.  Her husband left for work and then at approximately 7 AM she reported acute onset of feeling dizzy like she could pass out.  She was able to sit down and did not lose consciousness.  Patient reported that she had more difficulty speaking that she normally does since having stroke in 03/2020 of the left parietal lobe.  Associated symptoms included a mild frontal headache which is not uncommon as she has issues with her sinuses and has been feeling congested.  Last night she noted complaints of her right sided headache which is not the norm, but symptoms resolved without her taking any medication.  Patient reports that recently she had been having issues with Crestor for which she had stopped taking 40 mg daily and was just resumed on 5 mg daily due to muscle pains and reports of difficulty walking.  She had followed up with neurology here recently  Upon admission into the emergency department patient was seen as a code stroke evaluated by neurology.  Vital signs were noted to be stable.  CT scan of the head did not note any acute abnormality.  Patient was not a candidate for thrombolytics due to severity of symptoms.  Labs were relatively unremarkable.    Alcohol level was undetectable.  UDS was negative.  Urinalysis did not show any signs of infection. Patient had been given aspirin 650 mg p.o. x1 dose Review of Systems: As mentioned in the history  of present illness. All other systems reviewed and are negative. Past Medical History:  Diagnosis Date   Anxiety    Bronchitis    CAD (coronary artery disease)    a. 04/2008: cariac arrest with prox LAD occ s/p stenting, arctic sun. b. 11/2008: restenosis s/p repeat stenting. c. 01/2011: abnormal stress test but cath reportedly OK.   Dysplastic nevus 11/04/2007   Ant. chest. Slight to moderate atypia, extends to one edge.    Dysplastic nevus 01/03/2009   Left breast. Minimal atypia, close to margin.   Hypothyroid    Myocardial infarction [facility]) 2010   stents and cardiac rehab    Stroke [facility])    Past Surgical History:  Procedure Laterality Date   ANGIOPLASTY  2010   2 stents (femoral)    BUBBLE STUDY  04/23/2020   Procedure: BUBBLE STUDY;  Surgeon: Geralynn Rile, MD;  Location: [facility];  Service: Cardiovascular;;   IR CT HEAD LTD  04/18/2020   IR PERCUTANEOUS ART THROMBECTOMY/INFUSION INTRACRANIAL INC DIAG ANGIO  04/18/2020   LOOP RECORDER INSERTION N/A 04/23/2020   Procedure: LOOP RECORDER INSERTION;  Surgeon: Vickie Epley, MD;  Location: [place] CV LAB;  Service: Cardiovascular;  Laterality: N/A;   RADIOLOGY WITH ANESTHESIA N/A 04/18/2020   Procedure: IR WITH ANESTHESIA;  Surgeon: Radiologist, Medication, MD;  Location: [place];  Service: Radiology;  Laterality: N/A;   TEE WITHOUT CARDIOVERSION N/A 04/23/2020   Procedure: TRANSESOPHAGEAL ECHOCARDIOGRAM (  TEE);  Surgeon: Geralynn Rile, MD;  Location: [place];  Service: Cardiovascular;  Laterality: N/A;   TUBAL LIGATION     Social History:  reports that she quit smoking about 13 years ago. Her smoking use included cigarettes. She started smoking about 48 years ago. She smoked an average of 1.5 packs per day. She has never used smokeless tobacco. She reports that she does not drink alcohol and does not use drugs.  Allergies  Allergen Reactions   Sulfa Antibiotics Hives    Family History  Problem Relation  Age of Onset   Diabetes Mother    Hypertension Mother    Stroke Maternal Grandmother    Diabetes Maternal Grandfather    Stroke Maternal Grandfather    Breast cancer Other     Prior to Admission medications   Medication Sig Start Date End Date Taking? Authorizing Provider  Cholecalciferol 50 MCG (2000 UT) TABS Take 2,000 Units by mouth daily.   Yes [provider]  clopidogrel (PLAVIX) 75 MG tablet Take 1 tablet (75 mg total) by mouth daily. 07/26/20  Yes Nahser, Wonda Cheng, MD  cyanocobalamin (,VITAMIN B-12,) 1000 MCG/ML injection Inject 1,000 mcg into the muscle every 30 (thirty) days. 10/17/15  Yes [provider]  escitalopram (LEXAPRO) 10 MG tablet Take by mouth. 05/02/20 06/13/21 Yes [provider]  levothyroxine (SYNTHROID) 75 MCG tablet Take 75 mcg by mouth daily. 04/17/20  Yes [provider]  metoprolol succinate (TOPROL-XL) 25 MG 24 hr tablet Take 25 mg by mouth daily.   Yes [provider]  nitroGLYCERIN (NITROSTAT) 0.4 MG SL tablet Place 1 tablet (0.4 mg total) under the tongue every 5 (five) minutes as needed for chest pain. 05/22/20  Yes Nahser, Wonda Cheng, MD  pantoprazole (PROTONIX) 40 MG tablet Take 40 mg by mouth daily.   Yes [provider]  ramipril (ALTACE) 5 MG capsule Take 5 mg by mouth daily.   Yes [provider]  rosuvastatin (CRESTOR) 5 MG tablet Take 1 tablet (5 mg total) by mouth daily. 06/05/21  Yes Nahser, Wonda Cheng, MD    Physical Exam: Vitals:   06/13/21 0810 06/13/21 0815 06/13/21 0830 06/13/21 0900  BP: (!) 110/58 (!) 139/105 127/79 127/68  Pulse: 80 81 71 76  Resp: 13 (!) '22 16 18  '$ Temp: 98.1 F (36.7 C)     TempSrc: Oral     SpO2: 100% 100% 100% 97%  Weight: 101.4 kg     Height: '5\' 5"'$  (1.651 m)      Exam  Constitutional: Older female currently in no acute distress Eyes:   lids and conjunctivae normal ENMT: Mucous membranes are moist.   Neck: normal, supple, no masses, no  thyromegaly Respiratory: clear to auscultation bilaterally, no wheezing, no crackles. Normal respiratory effort.    Cardiovascular: Regular rate and rhythm, no murmurs / rubs / gallops. No extremity edema.   Abdomen: no tenderness, no masses palpated.   Musculoskeletal: no clubbing / cyanosis. No joint deformity upper and lower extremities. Good ROM, no contractures. Normal muscle tone.  Skin: no rashes, lesions, ulcers. No induration Neurologic: CN 2-12 grossly intact.   Strength 5/5 in all 4.  Mild aphasia appreciated. Psychiatric: Normal judgment and insight. Alert and oriented x 3. Normal mood.   Data Reviewed:  EKG revealed sinus rhythm 83 bpm with low voltage.  Reviewed patient's labs, imaging, and pertinent records as noted above in HPI Assessment and Plan: TIA Patient reported having cute onset of dizziness with  worsening aphasia starting at around 7 AM this morning.  She was last noted to be normal around 6:45 AM prior to her husband leaving for work.  CT scan of the head did not note any acute abnormalities.  She had been given 650 mg of aspirin 1 dose in the ED. -Admit to a telemetry bed -Stroke order set utilized -Neurochecks -Check CT angiogram of the head and neck -Check MRI of the brain -Check echocardiogram -Allow for permissive hypertension -Continue Plavix and aspirin -Appreciate neurology consultative services, we will follow-up for any further recommendation  Essential hypertension Blood pressures currently maintained.  Home blood pressure regimen includes metoprolol 25 mg daily and ramipril 5 mg daily. -Held home blood treatment to allow for permissive hypertension for 24 hours  Hyperlipidemia Home medication regimen includes Crestor 5 mg daily.  Crestor had temporarily been stopped due to reports of myalgias. -Check lipid panel -Goal LDL less than 70 -Continue Crestor and adjust dose as needed  Hypothyroidism Last TSH was 0.425 in 03/2020.  Patient has  reported increased tiredness. -Check TSH -Continue levothyroxine and adjust dose if needed.  CAD Prior history of MI 13 years ago.  Patient did not report any planes of chest pain. -continue plavix and statin  History of CVA with residual deficit Patient with prior history of left parietal stroke back in 03/2020 which left her with mild patient.  She has been taking Plavix daily. -Continue Plavix  Obesity BMI 37.2 kg/m2  Advance Care Planning:   Code Status: Full Code   Consults: Neurology  Family Communication: Family updated at bedside  Severity of Illness: The appropriate patient status for this patient is OBSERVATION. Observation status is judged to be reasonable and necessary in order to provide the required intensity of service to ensure the patient's safety. The patient's presenting symptoms, physical exam findings, and initial radiographic and laboratory data in the context of their medical condition is felt to place them at decreased risk for further clinical deterioration. Furthermore, it is anticipated that the patient will be medically stable for discharge from the hospital within 2 midnights of admission.   Author: Norval Morton, MD 06/13/2021 9:15 AM  For on call review [email or web address].

## 2021-06-13 NOTE — Consult Note (Signed)
NEURO HOSPITALIST CONSULT NOTE   Requestig physician: Dr. Tomi Bamberger  Reason for Consult: Acute onset of worsening dysphasia  History obtained from:  EMS, Patient and Chart     HPI:                                                                                                                                          Tracey [name] is an 66 y.o. female with a PMHx of CAD, PVD s/p 2 femoral artery stents, loop recorder placement in 2022, stroke with residual mild dysphasia and anxiety who presents via EMS as a Code Stroke for acute onset of worsening dysphasia. LKN was 0650 when husband last interacted with her prior to going to work. She then called her husband at 0700 due to having a hard time speaking and he came back to the house. On returning, he noted the speech deficit and EMS was called. BP per EMS was 174/100 which decreased to 127/73. HR 78, 98% 2L, CBG 76.  Home medications include Plavix and rosuvastatin.   Past Medical History:  Diagnosis Date   Anxiety    Bronchitis    CAD (coronary artery disease)    a. 04/2008: cariac arrest with prox LAD occ s/p stenting, arctic sun. b. 11/2008: restenosis s/p repeat stenting. c. 01/2011: abnormal stress test but cath reportedly OK.   Dysplastic nevus 11/04/2007   Ant. chest. Slight to moderate atypia, extends to one edge.    Dysplastic nevus 01/03/2009   Left breast. Minimal atypia, close to margin.   Hypothyroid    Myocardial infarction [facility]) 2010   stents and cardiac rehab    Stroke [facility])     Past Surgical History:  Procedure Laterality Date   ANGIOPLASTY  2010   2 stents (femoral)    BUBBLE STUDY  04/23/2020   Procedure: BUBBLE STUDY;  Surgeon: Geralynn Rile, MD;  Location: [place];  Service: Cardiovascular;;   IR CT HEAD LTD  04/18/2020   IR PERCUTANEOUS ART THROMBECTOMY/INFUSION INTRACRANIAL INC DIAG ANGIO  04/18/2020   LOOP RECORDER INSERTION N/A 04/23/2020   Procedure: LOOP RECORDER INSERTION;   Surgeon: Vickie Epley, MD;  Location: [place] CV LAB;  Service: Cardiovascular;  Laterality: N/A;   RADIOLOGY WITH ANESTHESIA N/A 04/18/2020   Procedure: IR WITH ANESTHESIA;  Surgeon: Radiologist, Medication, MD;  Location: [place];  Service: Radiology;  Laterality: N/A;   TEE WITHOUT CARDIOVERSION N/A 04/23/2020   Procedure: TRANSESOPHAGEAL ECHOCARDIOGRAM (TEE);  Surgeon: Geralynn Rile, MD;  Location: [facility] ENDOSCOPY;  Service: Cardiovascular;  Laterality: N/A;   TUBAL LIGATION      Family History  Problem Relation Age of Onset   Diabetes Mother    Hypertension Mother    Stroke Maternal Grandmother    Diabetes Maternal  Grandfather    Stroke Maternal Grandfather    Breast cancer Other              Social History:  reports that she quit smoking about 13 years ago. Her smoking use included cigarettes. She started smoking about 48 years ago. She smoked an average of 1.5 packs per day. She has never used smokeless tobacco. She reports that she does not drink alcohol and does not use drugs.  Allergies  Allergen Reactions   Sulfa Antibiotics Hives    HOME MEDICATIONS:                                                                                                                      No current facility-administered medications on file prior to encounter.   Current Outpatient Medications on File Prior to Encounter  Medication Sig Dispense Refill   Cholecalciferol 50 MCG (2000 UT) TABS Take 2,000 Units by mouth daily.     clopidogrel (PLAVIX) 75 MG tablet Take 1 tablet (75 mg total) by mouth daily. 90 tablet 3   cyanocobalamin (,VITAMIN B-12,) 1000 MCG/ML injection Inject 1,000 mcg into the muscle every 30 (thirty) days.     escitalopram (LEXAPRO) 10 MG tablet Take by mouth.     levothyroxine (SYNTHROID) 75 MCG tablet Take 75 mcg by mouth daily.     metoprolol succinate (TOPROL-XL) 25 MG 24 hr tablet Take 25 mg by mouth daily.     nitroGLYCERIN (NITROSTAT) 0.4 MG SL tablet  Place 1 tablet (0.4 mg total) under the tongue every 5 (five) minutes as needed for chest pain. 25 tablet 3   pantoprazole (PROTONIX) 40 MG tablet Take 40 mg by mouth daily.     ramipril (ALTACE) 5 MG capsule Take 5 mg by mouth daily.     rosuvastatin (CRESTOR) 5 MG tablet Take 1 tablet (5 mg total) by mouth daily. 30 tablet 5     ROS:                                                                                                                                       Complains of a 3/10 frontal headache that is nonthrobbing, without photophobia. Other ROS as per HPI. Detailed ROS deferred due to acuity of presentation.    Ht '5\' 5"'$  (1.651 m)   Wt 101.4 kg   BMI 37.20 kg/m    General Examination:  Physical Exam  HEENT-  [place]/AT   Lungs- Respirations unlabored Extremities- Warm and well perfused. No edema.   Neurological Examination Mental Status: Awake and alert. Oriented x 4. Anxious affect. Speech is with intact syntax and grammar, but with mild dysarthria and occasional phonemic paraphasias. Concentration intact: able to recite the months of the year forwards and backwards without difficulty. Verbal identification for all 5 fingers is intact, but two were given with dysarthria and word structure was altered consistent with phonemic paraphasia. Comprehension intact for all questions and commands.  Cranial Nerves: II: Temporal visual fields intact with no extinction to DSS.  III,IV, VI: No ptosis. EOMI. No nystagmus.  V: FT sensation equal bilaterally VII: Smile symmetric VIII: Hearing intact to conversation IX,X: No hoarseness or hypophonia XI: Symmetric XII: Midline tongue extension Motor: Right : Upper extremity   5/5    Left:     Upper extremity   5/5  Lower extremity   5/5     Lower extremity   5/5 No pronator drift Sensory: Decreased temp sensation to RLE. Moderate to severe  loss of touch sensation to RUE. With bilateral squeezing of arms to test pressure sensation, there is no extinction to DSS.  Deep Tendon Reflexes: 2+ bilateral brachioradialis and biceps. 3+ bilateral patellae. 2+ right achilles, 0 left achilles.  Cerebellar: No ataxia with FNF bilaterally Gait: Deferred   Lab Results: Basic Metabolic Panel: Recent Labs  Lab 06/13/21 0804  NA 140  K 4.1  CL 105  GLUCOSE 107*  BUN 18  CREATININE 0.90    CBC: Recent Labs  Lab 06/13/21 0800 06/13/21 0804  WBC 4.5  --   NEUTROABS 2.6  --   HGB 12.8 13.6  HCT 40.1 40.0  MCV 87.2  --   PLT 230  --     Cardiac Enzymes: No results for input(s): CKTOTAL, CKMB, CKMBINDEX, TROPONINI in the last 168 hours.  Lipid Panel: No results for input(s): CHOL, TRIG, HDL, CHOLHDL, VLDL, LDLCALC in the last 168 hours.  Imaging: No results found.   Assessment: 66 y.o. female with a PMHx of CAD, PVD s/p 2 femoral artery stents, loop recorder placement in 2022, stroke with residual mild dysphasia and anxiety who presents via EMS as a Code Stroke for acute onset of worsening dysphasia. LKN was 0650 when husband last interacted with her prior to going to work. She then called her husband at 0700 due to having a hard time speaking and he came back to the house. On returning, he noted the speech deficit and EMS was called. BP per EMS was 174/100 which decreased to 127/73. HR 78, 98% 2L, CBG 76. Home medications include Plavix and rosuvastatin.  1. Exam reveals phonemic paraphasias which patient states are worse than her baseline. Also with right sided sensory deficits. NIHSS = 2.  2. CT head: No evidence of acute intracranial pathology. Remote infarct in the left parietal lobe. 3. Stroke risk factors: CAD, PVD, obesity and prior stroke  4. She is not a TNK candidate due to mild deficits. Risks of ICH outweigh potential benefits. Discussed with patient who agreed that she did not feel that she would want to take the  risk of having a cerebral bleed in return for mild potential benefit from TNK.  5. Overall presentation not consistent with LVO.  6. EKG: Sinus rhythm; Low voltage, precordial leads; Anteroseptal infarct, old  Recommendations: 1. HgbA1c, fasting lipid panel 2. MRI of the brain without contrast if her loop recorder is  MRI compatible 3. PT consult, OT consult, Speech consult 4. TTE 5. CTA of head and neck 6. Add ASA to Plavix. Give 650 mg crushed ASA x 1 now.  7. Risk factor modification 8. Telemetry monitoring 9. Frequent neuro checks 10. NPO until passes stroke swallow screen 11. Continue her home statin 12. Permissive HTN x 24 hours 13. IVF    Electronically signed: Dr. Kerney Elbe 06/13/2021, 8:16 AM

## 2021-06-13 NOTE — ED Provider Notes (Signed)
[facility] EMERGENCY DEPARTMENT Provider Note   CSN: [id number] Arrival date & time: 06/13/21  6948  An emergency department physician performed an initial assessment on this suspected stroke patient at 66.  History  Chief Complaint  Patient presents with   Code Stroke    Tracey [name] is a 66 y.o. female.  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  EMS was called and patient was activated as a code stroke.  Pt states she felt somewhat lightheaded and also had a mild headache.  No trouble with weakness in her arms or legs.  She is having more trouble speaking than she usually does.  She does have some chronic deficits with  her speech from her prior stroke but this is worse.  Home Medications Prior to Admission medications   Medication Sig Start Date End Date Taking? Authorizing Provider  Cholecalciferol 50 MCG (2000 UT) TABS Take 2,000 Units by mouth daily.   Yes [provider]  clopidogrel (PLAVIX) 75 MG tablet Take 1 tablet (75 mg total) by mouth daily. 07/26/20  Yes Nahser, Wonda Cheng, MD  cyanocobalamin (,VITAMIN B-12,) 1000 MCG/ML injection Inject 1,000 mcg into the muscle every 30 (thirty) days. 10/17/15  Yes [provider]  escitalopram (LEXAPRO) 10 MG tablet Take by mouth. 05/02/20 06/13/21 Yes [provider]  levothyroxine (SYNTHROID) 75 MCG tablet Take 75 mcg by mouth daily. 04/17/20  Yes [provider]  metoprolol succinate (TOPROL-XL) 25 MG 24 hr tablet Take 25 mg by mouth daily.   Yes [provider]  nitroGLYCERIN (NITROSTAT) 0.4 MG SL tablet Place 1 tablet (0.4 mg total) under the tongue every 5 (five) minutes as needed for chest pain. 05/22/20  Yes Nahser, Wonda Cheng, MD  pantoprazole (PROTONIX) 40 MG tablet Take 40 mg by mouth daily.   Yes [provider]  ramipril  (ALTACE) 5 MG capsule Take 5 mg by mouth daily.   Yes [provider]  rosuvastatin (CRESTOR) 5 MG tablet Take 1 tablet (5 mg total) by mouth daily. 06/05/21  Yes Nahser, Wonda Cheng, MD      Allergies    Sulfa antibiotics    Review of Systems   Review of Systems  Constitutional:  Negative for fever.   Physical Exam Updated Vital Signs BP 127/68   Pulse 76   Temp 98.1 F (36.7 C) (Oral)   Resp 18   Ht 1.651 m ('5\' 5"'$ )   Wt 101.4 kg   SpO2 97%   BMI 37.20 kg/m  Physical Exam Vitals and nursing note reviewed.  Constitutional:      General: She is not in acute distress.    Appearance: She is well-developed.  HENT:     Head: Normocephalic and atraumatic.     Right Ear: External ear normal.     Left Ear: External ear normal.  Eyes:     General: No scleral icterus.       Right eye: No discharge.        Left eye: No discharge.     Conjunctiva/sclera: Conjunctivae normal.  Neck:     Trachea: No tracheal deviation.  Cardiovascular:     Rate and Rhythm: Normal rate and regular rhythm.  Pulmonary:     Effort: Pulmonary effort is normal. No respiratory distress.     Breath sounds: Normal breath sounds. No stridor.  Abdominal:     General:  There is no distension.  Musculoskeletal:        General: No swelling or deformity.     Cervical back: Neck supple.  Skin:    General: Skin is warm and dry.     Findings: No rash.  Neurological:     Mental Status: She is alert and oriented to person, place, and time.     Cranial Nerves: No facial asymmetry.     Sensory: Sensation is intact.     Comments: Aphasia with some word finding difficulty, normal grip strength bilaterally ,normal finger to nose, normal lower extrem strength    ED Results / Procedures / Treatments   Labs (all labs ordered are listed, but only abnormal results are displayed) Labs Reviewed  COMPREHENSIVE METABOLIC PANEL - Abnormal; Notable for the following components:      Result Value   Glucose, Bld 110  (*)    Albumin 3.4 (*)    All other components within normal limits  I-STAT CHEM 8, ED - Abnormal; Notable for the following components:   Glucose, Bld 107 (*)    Calcium, Ion 1.12 (*)    All other components within normal limits  RESP PANEL BY RT-PCR (FLU A&B, COVID) ARPGX2  ETHANOL  PROTIME-INR  APTT  CBC  DIFFERENTIAL  RAPID URINE DRUG SCREEN, HOSP PERFORMED  URINALYSIS, ROUTINE W REFLEX MICROSCOPIC  CBG MONITORING, ED    EKG EKG Interpretation  Date/Time:  Thursday Jun 13 2021 08:16:46 EDT Ventricular Rate:  83 PR Interval:  172 QRS Duration: 88 QT Interval:  386 QTC Calculation: 454 R Axis:   57 Text Interpretation: Sinus rhythm Low voltage, precordial leads Anteroseptal infarct, old No significant change since last tracing Confirmed by Dorie Rank [phone]) on 06/13/2021 9:16:09 AM  Radiology CT HEAD CODE STROKE WO CONTRAST  Result Date: 06/13/2021 CLINICAL DATA:  Code stroke. EXAM: CT HEAD WITHOUT CONTRAST TECHNIQUE: Contiguous axial images were obtained from the base of the skull through the vertex without intravenous contrast. RADIATION DOSE REDUCTION: This exam was performed according to the departmental dose-optimization program which includes automated exposure control, adjustment of the mA and/or kV according to patient size and/or use of iterative reconstruction technique. COMPARISON:  CT head 04/19/2020, MR head 04/20/2020 FINDINGS: Brain: There is no evidence of acute intracranial hemorrhage, extra-axial fluid collection, or acute infarct. Background parenchymal volume is normal. The ventricles are normal in size. A remote infarct in the left parietal lobe has undergone expected evolution since the previous study from 2022. Gray-white differentiation is otherwise preserved. Small foci of hypodensity in the subcortical and periventricular white matter are consistent with background chronic white matter microangiopathy. There is no mass lesion. There is no mass effect or  midline shift. An empty sella is noted, unchanged. Vascular: No hyperdense vessel or unexpected calcification. Skull: Normal. Negative for fracture or focal lesion. Sinuses/Orbits: The paranasal sinuses are clear. The globes and orbits are unremarkable. Other: None. ASPECTS [facility] Stroke Program Early CT Score) - Ganglionic level infarction (caudate, lentiform nuclei, internal capsule, insula, M1-M3 cortex): 7 - Supraganglionic infarction (M4-M6 cortex): 3 Total score (0-10 with 10 being normal): 10 IMPRESSION: 1. No evidence of acute intracranial pathology. 2. Remote infarct in the left parietal lobe. These results were called by telephone at the time of interpretation on 06/13/2021 at 8:14 am to provider Dr Cheral Marker, who verbally acknowledged these results. Electronically Signed   By: Valetta Mole M.D.   On: 06/13/2021 08:15    Procedures Procedures    Medications Ordered  in ED Medications  aspirin EC tablet 81 mg (has no administration in time range)  aspirin EC tablet 650 mg (650 mg Oral Given 06/13/21 0923)    ED Course/ Medical Decision Making/ A&P Clinical Course as of 06/13/21 0918  Thu Jun 13, 2021  0819 Reviewed case with Dr Cheral Marker.   Not a tpa candidate based on severity of her symptoms [JK]  0819 CBC Normal [JK]  0819 I-stat chem 8, ED(!) Normal [JK]  0819 Head CT without acute findings [JK]  0915 Discussed case with Dr Tamala Julian regarding admission [JK]    Clinical Course User Index [JK] Dorie Rank, MD                           Medical Decision Making Problems Addressed: Aphasia: acute illness or injury that poses a threat to life or bodily functions Coronary artery disease involving native heart, unspecified vessel or lesion type, unspecified whether angina present: chronic illness or injury  Amount and/or Complexity of Data Reviewed External Data Reviewed: notes.    Details: Outpt neuro note reviewed 5/15 Labs: ordered. Decision-making details documented in ED  Course. Radiology: ordered and independent interpretation performed.  Risk Decision regarding hospitalization.   Patient presented to the ED for evaluation of acute speech disturbance.  Patient activated as a code stroke.  Patient's presentation concerning for recurrent stroke but no indications for tPA after evaluation by the stroke team.  Case discussed with neurology and the medical service.  Patient will be admitted to the hospital for further treatment and evaluation        Final Clinical Impression(s) / ED Diagnoses Final diagnoses:  Aphasia  Coronary artery disease involving native heart, unspecified vessel or lesion type, unspecified whether angina present    Rx / DC Orders ED Discharge Orders     None         Dorie Rank, MD 06/13/21 [phone]

## 2021-06-13 NOTE — Code Documentation (Addendum)
Stroke Response Nurse Documentation Code Documentation  Tracey [name] is a 66 y.o. female arriving to [facility]  via [place] [facility] on 06/13/21 with past medical hx of CVA (minor aphasia at baseline), MI, CAD, GERD, hypothyroidism. On clopidogrel 75 mg daily. Code stroke was activated by EMS.   Patient from home where she was LKW at [phone] and now complaining of worsening speech difficulties. Patient was LKW at [phone] getting ready for the day. At 0700 she noticed increased difficulty with words, headache, dizziness. She called her husband at work and he called EMS. Pt's BP 127/73, CBG 76.   Stroke team at the bedside on patient arrival. Labs drawn and patient cleared for CT by Dr. Tomi Bamberger. Patient to CT with team. NIHSS 2, see documentation for details and code stroke times. Patient with Expressive aphasia  and dysarthria  on exam. The following imaging was completed:  CT Head. Patient is not a candidate for IV Thrombolytic due to symptoms too mild/improving. Patient is not a candidate for IR due to no concern for LVO per MD.    Care Plan: Q30 NIHSS/vitals while in the TNK window until 1130, MRI, BP<180.   Bedside handoff with ED RN Marta Antu.    Argie Lober, Rande Brunt  Stroke Response RN

## 2021-06-13 NOTE — ED Triage Notes (Signed)
Pt arrived via [facility] with c/c of Code Stroke. Per EMS [facility] [phone] 05/18, called husband at 0700 due to hard time speaking. 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   174/100/ --> 127/73, 78 HR, 98% 2L, CBG 76

## 2021-06-13 NOTE — ED Notes (Signed)
Pt taken to Xray.

## 2021-06-13 NOTE — ED Notes (Signed)
Pt taken from Xray to MRI, unable to perform NIH while pt gone

## 2021-06-13 NOTE — Evaluation (Signed)
Physical Therapy Evaluation and Discharge Patient Details Name: Tracey [name] MRN: [id number] DOB: 03/10/1955 Today's Date: 06/13/2021  History of Present Illness  Pt is a 65 y/o female admitted secondary to dizziness and difficulty speaking. MRI negative. PMH includes CVA with residual expressive difficulty, CAD, PVD.  Clinical Impression  Patient evaluated by Physical Therapy with no further acute PT needs identified. All education has been completed and the patient has no further questions. Pt overall at an independent level for transfers and gait. Able to perform DGI tasks without difficulty. Mild speech deficits and mild wooziness, but pt reports this is baseline. Educated about "BE FAST" acronym in recognizing CVA symptoms. See below for any follow-up Physical Therapy or equipment needs. PT is signing off. Thank you for this referral. If needs change, please re-consult.         Recommendations for follow up therapy are one component of a multi-disciplinary discharge planning process, led by the attending physician.  Recommendations may be updated based on patient status, additional functional criteria and insurance authorization.  Follow Up Recommendations No PT follow up    Assistance Recommended at Discharge Intermittent Supervision/Assistance  Patient can return home with the following       Equipment Recommendations None recommended by PT  Recommendations for Other Services       Functional Status Assessment Patient has had a recent decline in their functional status and demonstrates the ability to make significant improvements in function in a reasonable and predictable amount of time.     Precautions / Restrictions Precautions Precautions: Fall Restrictions Weight Bearing Restrictions: No      Mobility  Bed Mobility Overal bed mobility: Needs Assistance Bed Mobility: Supine to Sit, Sit to Supine     Supine to sit: Min guard Sit to supine: Modified independent  (Device/Increase time)   General bed mobility comments: Min guard for safety to come to sitting on stretcher    Transfers Overall transfer level: Independent                      Ambulation/Gait Ambulation/Gait assistance: Independent Gait Distance (Feet): 140 Feet Assistive device: None Gait Pattern/deviations: WFL(Within Functional Limits) Gait velocity: WFL     General Gait Details: overall steady gait and able to perform DGI tasks without difficulty. Pt reports wooziness at baseline, but did not seem to affect mobility.  Stairs            Wheelchair Mobility    Modified Rankin (Stroke Patients Only)       Balance Overall balance assessment: Independent                               Standardized Balance Assessment Standardized Balance Assessment : Dynamic Gait Index   Dynamic Gait Index Level Surface: Normal Change in Gait Speed: Normal Gait with Horizontal Head Turns: Normal Gait with Vertical Head Turns: Normal Gait and Pivot Turn: Mild Impairment Step Over Obstacle: Normal Step Around Obstacles: Normal       Pertinent Vitals/Pain Pain Assessment Pain Assessment: No/denies pain    Home Living Family/patient expects to be discharged to:: Private residence Living Arrangements: Spouse/significant other Available Help at Discharge: Family Type of Home: House Home Access: Stairs to enter Entrance Stairs-Rails: Left Entrance Stairs-Number of Steps: 2   Home Layout: One level Home Equipment: Cane - single point      Prior Function Prior Level of Function : Independent/Modified Independent  Hand Dominance        Extremity/Trunk Assessment   Upper Extremity Assessment Upper Extremity Assessment: Overall WFL for tasks assessed    Lower Extremity Assessment Lower Extremity Assessment: Overall WFL for tasks assessed    Cervical / Trunk Assessment Cervical / Trunk Assessment: Normal   Communication   Communication: Expressive difficulties (at baseline)  Cognition Arousal/Alertness: Awake/alert Behavior During Therapy: WFL for tasks assessed/performed Overall Cognitive Status: Within Functional Limits for tasks assessed                                          General Comments General comments (skin integrity, edema, etc.): Educated about "BE FAST" acronym in recognizing CVA symptoms.    Exercises     Assessment/Plan    PT Assessment Patient does not need any further PT services  PT Problem List         PT Treatment Interventions      PT Goals (Current goals can be found in the Care Plan section)  Acute Rehab PT Goals Patient Stated Goal: to go home PT Goal Formulation: With patient Time For Goal Achievement: 06/13/21 Potential to Achieve Goals: Good    Frequency       Co-evaluation               AM-PAC PT "6 Clicks" Mobility  Outcome Measure Help needed turning from your back to your side while in a flat bed without using bedrails?: None Help needed moving from lying on your back to sitting on the side of a flat bed without using bedrails?: None Help needed moving to and from a bed to a chair (including a wheelchair)?: None Help needed standing up from a chair using your arms (e.g., wheelchair or bedside chair)?: None Help needed to walk in hospital room?: None Help needed climbing 3-5 steps with a railing? : A Little 6 Click Score: 23    End of Session Equipment Utilized During Treatment: Gait belt Activity Tolerance: Patient tolerated treatment well Patient left: in bed;with call bell/phone within reach;Other (comment);with family/visitor present (on stretcher in ED with MD present) Nurse Communication: Mobility status PT Visit Diagnosis: Other symptoms and signs involving the nervous system (Y51.833)    Time: 5825-1898 PT Time Calculation (min) (ACUTE ONLY): 16 min   Charges:   PT Evaluation $PT Eval Low  Complexity: 1 Low          Lou Miner, DPT  Acute Rehabilitation Services  Pager: [phone] Office: [phone]   Tracey [name] 06/13/2021, 1:44 PM

## 2021-06-14 ENCOUNTER — Telehealth: Payer: Self-pay | Admitting: Pharmacist

## 2021-06-14 MED ORDER — ROSUVASTATIN CALCIUM 40 MG PO TABS: 40.0000 mg | ORAL_TABLET | Freq: Every day | ORAL | 3 refills | Status: DC

## 2021-06-14 NOTE — Telephone Encounter (Signed)
Pt called clinic. Had a TIA yesterday. Lipid panel was checked and LDL had increased from 51 to 84. Pt wishes to increase her rosuvastatin from '5mg'$  back to '40mg'$  daily. Previously experienced leg cramps on this but is afraid of having another TIA and would rather control her cholesterol better. Discussed that combination of lower dose of rosuvastatin with ezetimibe or trial of Repatha would also control her cholesterol well and she may tolerate it better. She wishes to try the higher dose of rosuvastatin for now. Advised her to call clinic if leg pain becomes intolerable, would try one of the above options at that time. New rx sent to pharmacy.

## 2021-06-17 ENCOUNTER — Telehealth: Payer: Self-pay

## 2021-06-17 NOTE — Discharge Summary (Addendum)
Physician Discharge Summary   Patient: Tracey [name] MRN: [id number] DOB: March 06, 1955  Admit date:     06/13/2021  Discharge date: 06/13/2021  Discharge Physician: Norval Morton   PCP: Rusty Aus, MD   Recommendations at discharge:   Follow-up with primary care provider in regards to Crestor dosing and recent lipid panel. Follow-up in regards to further work-up patient's lethargy with primary care provider.   Discharge Diagnoses: Principal Problem:   TIA (transient ischemic attack) Active Problems:   Essential hypertension   Hyperlipidemia, mixed   Acquired hypothyroidism   CAD (coronary artery disease)   Obesity (BMI 30-39.9)    Hospital Course: Tracey [name] is a 66 y.o. 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  When she woke up this morning around 6:45 AM she was in her normal state of health.  Her husband left for work and then at approximately 7 AM she reported acute onset of feeling dizzy like she could pass out.  She was able to sit down and did not lose consciousness.  Patient reported that she had more difficulty speaking that she normally does since having stroke in 03/2020 of the left parietal lobe.  Associated symptoms included a mild frontal headache which is not uncommon as she has issues with her sinuses and has been feeling congested.  Last night she noted complaints of her right sided headache which is not the norm, but symptoms resolved without her taking any medication.  Patient reports that recently she had been having issues with Crestor for which she had stopped taking 40 mg daily and was just resumed on 5 mg daily due to muscle pains and reports of difficulty walking.  Upon admission into the emergency department patient was seen as a code stroke evaluated by neurology.  Vital signs were noted to be stable.  CT scan of the head did not note  any acute abnormality.  Patient was not a candidate for thrombolytics due to severity of symptoms.  Labs were relatively unremarkable.    Alcohol level was undetectable.  UDS was negative.  Urinalysis did not show any signs of infection. Patient had been given aspirin 650 mg p.o. x1 dose  Assessment and Plan: TIA Patient reported having acute onset of dizziness with worsening aphasia starting at around 7 AM this morning.  She was last noted to be normal around 6:45 AM prior to her husband leaving for work.  CT scan of the head did not note any acute abnormalities.  She had been given 650 mg of aspirin 1 dose in the ED recommended to resume Plavix. patient was not a thrombolytic candidate.  MRI of the brain did not note any acute signs of stroke and noted previous left parietal stroke.  CT angiogram of the head and neck did not note any large vessel occlusion.  Echocardiogram noted unchanged ejection fraction and previous PFO was discussed with cardiology but due to patient being over the age of 67 he was not recommended for closure.  Symptoms thought more likely secondary to A-fib.  Her loop recorder was interrogated and did not note any significant arrhythmias.  Patient was recommended to be on dual antiplatelet therapy and follow-up with her neurologist in the outpatient setting.  Essential hypertension Blood pressures currently maintained.  Home blood pressure regimen includes metoprolol 25 mg daily and ramipril 5 mg daily.  She had been initially  recommended to allow for permissive hypertension for 24 hours.  Hyperlipidemia Home medication regimen includes Crestor 5 mg daily.  Crestor had temporarily been stopped due to reports of myalgias.  Lipid panel revealed LDL 84.  Goal LDL less than 70.  As she had just recently resumed on Crestor patient was recommended to follow-up with her neurologist in the outpatient setting  Hypothyroidism Last TSH was 0.425 in 03/2020.  Patient has reported increased  tiredness.  Patient was continued on home dose of levothyroxine 75 mcg daily.  Plan was to obtain repeat TSH testing but had not been done prior to patient being discharged.  CAD Prior history of MI 13 years ago.  Patient did not report any planes of chest pain. -continue plavix and statin  History of CVA with residual deficit Patient with prior history of left parietal stroke back in 03/2020 which left her with mild patient.  She has been taking Plavix daily. -Continue Plavix  Obesity BMI 37.2 kg/m2  Consultants: Neurology Procedures performed: None Disposition: Home Diet recommendation:  Discharge Diet Orders (From admission, onward)     Start     Ordered   06/13/21 0000  Diet - low sodium heart healthy        06/13/21 1722            DISCHARGE MEDICATION: Allergies as of 06/13/2021       Reactions   Sulfa Antibiotics Hives        Medication List     TAKE these medications    aspirin EC 81 MG tablet Take 1 tablet (81 mg total) by mouth daily. Swallow whole.   Cholecalciferol 50 MCG (2000 UT) Tabs Take 2,000 Units by mouth daily.   clopidogrel 75 MG tablet Commonly known as: PLAVIX Take 1 tablet (75 mg total) by mouth daily.   cyanocobalamin 1000 MCG/ML injection Commonly known as: (VITAMIN B-12) Inject 1,000 mcg into the muscle every 30 (thirty) days.   escitalopram 10 MG tablet Commonly known as: LEXAPRO Take by mouth.   levothyroxine 75 MCG tablet Commonly known as: SYNTHROID Take 75 mcg by mouth daily.   metoprolol succinate 25 MG 24 hr tablet Commonly known as: TOPROL-XL Take 25 mg by mouth daily.   nitroGLYCERIN 0.4 MG SL tablet Commonly known as: NITROSTAT Place 1 tablet (0.4 mg total) under the tongue every 5 (five) minutes as needed for chest pain.   pantoprazole 40 MG tablet Commonly known as: PROTONIX Take 40 mg by mouth daily.   ramipril 5 MG capsule Commonly known as: ALTACE Take 5 mg by mouth daily.        Follow-up  Information     [name]. Call today.   Why: Please call and make a follow-up appointment for the next 1- 2 weeks. Contact information: [address]     [place] [id number] [phone]               Discharge Exam: Danley Danker Weights   06/13/21 0800 06/13/21 0810  Weight: 101.4 kg 101.4 kg   Constitutional: Older female currently in no acute distress Eyes:   lids and conjunctivae normal ENMT: Mucous membranes are moist.   Neck: normal, supple, no masses, no thyromegaly Respiratory: clear to auscultation bilaterally, no wheezing, no crackles. Normal respiratory effort.    Cardiovascular: Regular rate and rhythm, no murmurs / rubs / gallops. No extremity edema.   Abdomen: no tenderness, no masses palpated.   Musculoskeletal: no clubbing / cyanosis. No  joint deformity upper and lower extremities. Good ROM, no contractures. Normal muscle tone.  Skin: no rashes, lesions, ulcers. No induration Neurologic: CN 2-12 grossly intact.   Strength 5/5 in all 4.  Mild aphasia appreciated. Psychiatric: Normal judgment and insight. Alert and oriented x 3. Normal mood.   Condition at discharge: stable  The results of significant diagnostics from this hospitalization (including imaging, microbiology, ancillary and laboratory) are listed below for reference.   Imaging Studies: DG Chest 2 View  Result Date: 06/13/2021 CLINICAL DATA:  Stroke EXAM: CHEST - 2 VIEW COMPARISON:  Prior chest x-ray 04/24/2020 FINDINGS: Stable cardiac and mediastinal contours. Metallic stent projects over the LAD. Implantable loop recorder projects over the left chest. No evidence of airspace opacity, pulmonary nodule, pleural effusion or pneumothorax. IMPRESSION: No active cardiopulmonary disease. Electronically Signed   By: Jacqulynn Cadet M.D.   On: 06/13/2021 10:06   MR BRAIN WO CONTRAST  Result Date: 06/13/2021 CLINICAL DATA:  Stroke follow-up. Acute on chronic  speech difficulty. History of stroke. EXAM: MRI HEAD WITHOUT CONTRAST TECHNIQUE: Multiplanar, multiecho pulse sequences of the brain and surrounding structures were obtained without intravenous contrast. COMPARISON:  Head CT and CTA 06/13/2021.  Head MRI 04/20/2020. FINDINGS: Brain: There is no evidence of an acute infarct, intracranial hemorrhage, mass, midline shift, hydrocephalus or extra-axial fluid collection. Scattered small T2 hyperintensities in the cerebral white matter bilaterally are unchanged from the prior MRI and are nonspecific but compatible with mild chronic small vessel ischemic disease. A small chronic left parietal infarct is again noted. A partially empty sella is unchanged. There is mild cerebral atrophy. Vascular: Major intracranial vascular flow voids are preserved. Skull and upper cervical spine: Unremarkable bone marrow signal. Sinuses/Orbits: Unremarkable orbits. Paranasal sinuses and mastoid air cells are clear. Other: None. IMPRESSION: 1. No acute intracranial abnormality. 2. Mild chronic small vessel ischemic disease. 3. Chronic left parietal infarct. Electronically Signed   By: Logan Bores M.D.   On: 06/13/2021 11:32   ECHOCARDIOGRAM COMPLETE  Result Date: 06/13/2021    ECHOCARDIOGRAM REPORT   Patient Name:   CLOTHILDE TIPPETTS Arrighi Date of Exam: 06/13/2021 Medical Rec #:  [id number]         Height:       65.0 in Accession #:    [id number]        Weight:       223.5 lb Date of Birth:  February 16, 1955         BSA:          2.074 m Patient Age:    87 years          BP:           127/68 mmHg Patient Gender: F                 HR:           68 bpm. Exam Location:  Inpatient Procedure: 2D Echo, Cardiac Doppler, Color Doppler and Intracardiac            Opacification Agent Indications:    CVA  History:        Patient has prior history of Echocardiogram examinations, most                 recent 04/23/2020. Previous Myocardial Infarction and CAD.  Sonographer:    Raquel Sarna Senior Sonographer#2:  Ula Lingo RDCS (AE, PE) Referring Phys: [phone] Graylee Arutyunyan A Gala Padovano IMPRESSIONS  1. Distal septal diyskinesis . Left ventricular ejection fraction, by estimation, is  45 to 50%. The left ventricle has mildly decreased function. The left ventricle demonstrates regional wall motion abnormalities (see scoring diagram/findings for description). Left ventricular diastolic parameters were normal.  2. Right ventricular systolic function is normal. The right ventricular size is normal.  3. The mitral valve is abnormal. Trivial mitral valve regurgitation. No evidence of mitral stenosis.  4. The aortic valve is normal in structure. There is mild calcification of the aortic valve. Aortic valve regurgitation is not visualized. Aortic valve sclerosis is present, with no evidence of aortic valve stenosis.  5. The inferior vena cava is normal in size with greater than 50% respiratory variability, suggesting right atrial pressure of 3 mmHg. FINDINGS  Left Ventricle: Distal septal diyskinesis. Left ventricular ejection fraction, by estimation, is 45 to 50%. The left ventricle has mildly decreased function. The left ventricle demonstrates regional wall motion abnormalities. Definity contrast agent was  given IV to delineate the left ventricular endocardial borders. The left ventricular internal cavity size was normal in size. There is no left ventricular hypertrophy. Left ventricular diastolic parameters were normal. Right Ventricle: The right ventricular size is normal. No increase in right ventricular wall thickness. Right ventricular systolic function is normal. Left Atrium: Left atrial size was normal in size. Right Atrium: Right atrial size was normal in size. Pericardium: There is no evidence of pericardial effusion. Mitral Valve: The mitral valve is abnormal. There is mild thickening of the mitral valve leaflet(s). Mild mitral annular calcification. Trivial mitral valve regurgitation. No evidence of mitral valve stenosis. Tricuspid  Valve: The tricuspid valve is normal in structure. Tricuspid valve regurgitation is trivial. No evidence of tricuspid stenosis. Aortic Valve: The aortic valve is normal in structure. There is mild calcification of the aortic valve. Aortic valve regurgitation is not visualized. Aortic valve sclerosis is present, with no evidence of aortic valve stenosis. Pulmonic Valve: The pulmonic valve was normal in structure. Pulmonic valve regurgitation is not visualized. No evidence of pulmonic stenosis. Aorta: The aortic root is normal in size and structure. Venous: The inferior vena cava is normal in size with greater than 50% respiratory variability, suggesting right atrial pressure of 3 mmHg. IAS/Shunts: No atrial level shunt detected by color flow Doppler.  LEFT VENTRICLE PLAX 2D LVIDd:         4.80 cm   Diastology LVIDs:         2.90 cm   LV e' medial:    9.25 cm/s LV PW:         0.80 cm   LV E/e' medial:  8.8 LV IVS:        0.80 cm   LV e' lateral:   9.79 cm/s LVOT diam:     2.10 cm   LV E/e' lateral: 8.4 LV SV:         67 LV SV Index:   32 LVOT Area:     3.46 cm  RIGHT VENTRICLE RV S prime:     10.90 cm/s TAPSE (M-mode): 1.9 cm LEFT ATRIUM             Index        RIGHT ATRIUM           Index LA diam:        3.10 cm 1.49 cm/m   RA Area:     11.80 cm LA Vol (A2C):   52.5 ml 25.31 ml/m  RA Volume:   25.30 ml  12.20 ml/m LA Vol (A4C):   37.4 ml 18.03 ml/m LA  Biplane Vol: 44.6 ml 21.50 ml/m  AORTIC VALVE LVOT Vmax:   85.40 cm/s LVOT Vmean:  63.400 cm/s LVOT VTI:    0.193 m  AORTA Ao Root diam: 2.70 cm Ao Asc diam:  3.20 cm MITRAL VALVE MV Area (PHT): 2.91 cm    SHUNTS MV Decel Time: 261 msec    Systemic VTI:  0.19 m MV E velocity: 81.80 cm/s  Systemic Diam: 2.10 cm MV A velocity: 69.70 cm/s MV E/A ratio:  1.17 Jenkins Rouge MD Electronically signed by Jenkins Rouge MD Signature Date/Time: 06/13/2021/12:39:10 PM    Final    CT HEAD CODE STROKE WO CONTRAST  Result Date: 06/13/2021 CLINICAL DATA:  Code stroke. EXAM:  CT HEAD WITHOUT CONTRAST TECHNIQUE: Contiguous axial images were obtained from the base of the skull through the vertex without intravenous contrast. RADIATION DOSE REDUCTION: This exam was performed according to the departmental dose-optimization program which includes automated exposure control, adjustment of the mA and/or kV according to patient size and/or use of iterative reconstruction technique. COMPARISON:  CT head 04/19/2020, MR head 04/20/2020 FINDINGS: Brain: There is no evidence of acute intracranial hemorrhage, extra-axial fluid collection, or acute infarct. Background parenchymal volume is normal. The ventricles are normal in size. A remote infarct in the left parietal lobe has undergone expected evolution since the previous study from 2022. Gray-white differentiation is otherwise preserved. Small foci of hypodensity in the subcortical and periventricular white matter are consistent with background chronic white matter microangiopathy. There is no mass lesion. There is no mass effect or midline shift. An empty sella is noted, unchanged. Vascular: No hyperdense vessel or unexpected calcification. Skull: Normal. Negative for fracture or focal lesion. Sinuses/Orbits: The paranasal sinuses are clear. The globes and orbits are unremarkable. Other: None. ASPECTS [facility] Stroke Program Early CT Score) - Ganglionic level infarction (caudate, lentiform nuclei, internal capsule, insula, M1-M3 cortex): 7 - Supraganglionic infarction (M4-M6 cortex): 3 Total score (0-10 with 10 being normal): 10 IMPRESSION: 1. No evidence of acute intracranial pathology. 2. Remote infarct in the left parietal lobe. These results were called by telephone at the time of interpretation on 06/13/2021 at 8:14 am to provider Dr Cheral Marker, who verbally acknowledged these results. Electronically Signed   By: Valetta Mole M.D.   On: 06/13/2021 08:15   CT ANGIO HEAD NECK W WO CM (CODE STROKE)  Result Date: 06/13/2021 CLINICAL DATA:   Stroke, follow up EXAM: CT ANGIOGRAPHY HEAD AND NECK TECHNIQUE: Multidetector CT imaging of the head and neck was performed using the standard protocol during bolus administration of intravenous contrast. Multiplanar CT image reconstructions and MIPs were obtained to evaluate the vascular anatomy. Carotid stenosis measurements (when applicable) are obtained utilizing NASCET criteria, using the distal internal carotid diameter as the denominator. RADIATION DOSE REDUCTION: This exam was performed according to the departmental dose-optimization program which includes automated exposure control, adjustment of the mA and/or kV according to patient size and/or use of iterative reconstruction technique. CONTRAST:  57m OMNIPAQUE IOHEXOL 350 MG/ML SOLN COMPARISON:  None Available. FINDINGS: CTA NECK Aortic arch: Mild calcified plaque. Great vessel origins are patent. Right carotid system: Patent. Stable calcified plaque at the ECA origin. No ICA stenosis. Left carotid system: Patent. Stable minimal plaque at the bifurcation. No stenosis. Vertebral arteries: Patent.  Codominant.  No stenosis. Skeleton: Cervical spine degenerative changes are similar appearance. Other neck: No new finding. Upper chest: Emphysema. Review of the MIP images confirms the above findings CTA HEAD Anterior circulation: Intracranial internal carotid arteries are patent  with mild calcified plaque. Anterior and middle cerebral arteries are patent. Posterior circulation: Intracranial vertebral arteries, basilar artery, and posterior cerebral arteries are patent. Venous sinuses: Patent as allowed by contrast bolus timing. Review of the MIP images confirms the above findings IMPRESSION: No large vessel occlusion or new stenosis. Electronically Signed   By: Macy Mis M.D.   On: 06/13/2021 10:31    Microbiology: Results for orders placed or performed during the hospital encounter of 06/13/21  Resp Panel by RT-PCR (Flu A&B, Covid) Nasopharyngeal  Swab     Status: None   Collection Time: 06/13/21  7:58 AM   Specimen: Nasopharyngeal Swab; Nasopharyngeal(NP) swabs in vial transport medium  Result Value Ref Range Status   SARS Coronavirus 2 by RT PCR NEGATIVE NEGATIVE Final    Comment: (NOTE) SARS-CoV-2 target nucleic acids are NOT DETECTED.  The SARS-CoV-2 RNA is generally detectable in upper respiratory specimens during the acute phase of infection. The lowest concentration of SARS-CoV-2 viral copies this assay can detect is 138 copies/mL. A negative result does not preclude SARS-Cov-2 infection and should not be used as the sole basis for treatment or other patient management decisions. A negative result may occur with  improper specimen collection/handling, submission of specimen other than nasopharyngeal swab, presence of viral mutation(s) within the areas targeted by this assay, and inadequate number of viral copies(<138 copies/mL). A negative result must be combined with clinical observations, patient history, and epidemiological information. The expected result is Negative.  Fact Sheet for Patients:  EntrepreneurPulse.com.au  Fact Sheet for Healthcare Providers:  IncredibleEmployment.be  This test is no t yet approved or cleared by the [place] FDA and  has been authorized for detection and/or diagnosis of SARS-CoV-2 by FDA under an Emergency Use Authorization (EUA). This EUA will remain  in effect (meaning this test can be used) for the duration of the COVID-19 declaration under Section 564(b)(1) of the Act, 21 U.S.C.section 360bbb-3(b)(1), unless the authorization is terminated  or revoked sooner.       Influenza A by PCR NEGATIVE NEGATIVE Final   Influenza B by PCR NEGATIVE NEGATIVE Final    Comment: (NOTE) The Xpert Xpress SARS-CoV-2/FLU/RSV plus assay is intended as an aid in the diagnosis of influenza from Nasopharyngeal swab specimens and should not be used as a sole  basis for treatment. Nasal washings and aspirates are unacceptable for Xpert Xpress SARS-CoV-2/FLU/RSV testing.  Fact Sheet for Patients: EntrepreneurPulse.com.au  Fact Sheet for Healthcare Providers: IncredibleEmployment.be  This test is not yet approved or cleared by the [place] FDA and has been authorized for detection and/or diagnosis of SARS-CoV-2 by FDA under an Emergency Use Authorization (EUA). This EUA will remain in effect (meaning this test can be used) for the duration of the COVID-19 declaration under Section 564(b)(1) of the Act, 21 U.S.C. section 360bbb-3(b)(1), unless the authorization is terminated or revoked.  Performed at [facility], [place] [address], [place], [place] [address]     Labs: CBC: Recent Labs  Lab 06/13/21 0800 06/13/21 0804  WBC 4.5  --   NEUTROABS 2.6  --   HGB 12.8 13.6  HCT 40.1 40.0  MCV 87.2  --   PLT 230  --    Basic Metabolic Panel: Recent Labs  Lab 06/13/21 0800 06/13/21 0804  NA 138 140  K 4.1 4.1  CL 106 105  CO2 25  --   GLUCOSE 110* 107*  BUN 16 18  CREATININE 0.95 0.90  CALCIUM 8.9  --  Liver Function Tests: Recent Labs  Lab 06/13/21 0800  AST 20  ALT 17  ALKPHOS 54  BILITOT 0.5  PROT 6.6  ALBUMIN 3.4*   CBG: Recent Labs  Lab 06/13/21 0759  GLUCAP 99    Discharge time spent: > 30 mins  Signed: Norval Morton, MD [facility] 06/17/2021

## 2021-06-17 NOTE — Telephone Encounter (Signed)
Patient left a voicemail this morning advising [place] that she had a TIA last week.  She was advised by the hospital to call her neurologist.  Please return her call.

## 2021-07-14 ENCOUNTER — Other Ambulatory Visit: Payer: Self-pay | Admitting: Cardiovascular Disease

## 2021-07-22 ENCOUNTER — Encounter: Payer: Self-pay | Admitting: Cardiovascular Disease

## 2021-07-22 NOTE — Progress Notes (Signed)
Cardiology Office Note:    Date:  07/26/2021   ID:  Tracey [name], DOB May 20, 1955, MRN [id number]  PCP:  Rusty Aus, MD   [place]  Cardiologist:  Zriyah Kopplin  Advanced Practice Provider:  No care team member to display Electrophysiologist:  Quentin Ore    :[id number]}   Referring MD: Rusty Aus, MD   Chief Complaint  Patient presents with   Coronary Artery Disease         May 22, 2020   Tracey [name] is a 66 y.o. female with a hx of CVA, CAD ( s/p anterior MI with stent placement in 2010) Was a smoker at the time ,   She was admitted in March with a CVA. 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 Quentin Ore She has moderate ( grade III) layered plaque in the transverse and descending aorta  She had mildly reduced LV function with apical akinesis.  Still speech is altered.  Going to speech therapy,  Some difficult with reading and writing . Still has some headaches   She is here to transfer from her cardiologist  at [facility]  No CP , no dyspnea She has some continued discomfort  from her implantable loop recorder   Has been found to have PAD   July 26, 2021: [name] is seen today for follow-up of her coronary artery disease.  (status post anterior wall MI in 2010 with stent placement to the LAD )She has a history of a CVA.  Transesophageal echo revealed an aneurysmal atrial septum with a PFO.  She has had an implantable loop placed by Dr. Quentin Ore.  Doing well.  Had a loop recorder for a stroke. No further episodes of TIA or stroke   Has occasional panic attacks Wants to cut back on her ASA ( currently on ASA 162- I told her I would defer to neuro on this issue  The cost of interrogation of the loop recorder costs $200 / month.  She has a history of anteroapical myocardial infarction.  She has an area of dyskinesis in the anterior apex.  Recent echo in May did not show a thrombus but  I am concerned that if she has mural thrombus in that dyskinetic area that this may be an etiology for her stroke.  We will plan on getting an MRI to evaluate her LV and to look for thrombus in the apex.  If she does have thrombus I think she should be started on DOAC Or Coumadin and DC the ASA and plavix    Past Medical History:  Diagnosis Date   Anxiety    Bronchitis    CAD (coronary artery disease)    a. 04/2008: cariac arrest with prox LAD occ s/p stenting, arctic sun. b. 11/2008: restenosis s/p repeat stenting. c. 01/2011: abnormal stress test but cath reportedly OK.   Dysplastic nevus 11/04/2007   Ant. chest. [name] to moderate atypia, extends to one edge.    Dysplastic nevus 01/03/2009   Left breast. Minimal atypia, close to margin.   Hypothyroid    Myocardial infarction [facility]) 2010   stents and cardiac rehab    Stroke [facility])     Past Surgical History:  Procedure Laterality Date   ANGIOPLASTY  2010   2 stents (femoral)    BUBBLE STUDY  04/23/2020   Procedure: BUBBLE STUDY;  Surgeon: Geralynn Rile, MD;  Location: [place];  Service:  Cardiovascular;;   IR CT HEAD LTD  04/18/2020   IR PERCUTANEOUS ART THROMBECTOMY/INFUSION INTRACRANIAL INC DIAG ANGIO  04/18/2020   LOOP RECORDER INSERTION N/A 04/23/2020   Procedure: LOOP RECORDER INSERTION;  Surgeon: Vickie Epley, MD;  Location: [place] CV LAB;  Service: Cardiovascular;  Laterality: N/A;   RADIOLOGY WITH ANESTHESIA N/A 04/18/2020   Procedure: IR WITH ANESTHESIA;  Surgeon: Radiologist, Medication, MD;  Location: [place];  Service: Radiology;  Laterality: N/A;   TEE WITHOUT CARDIOVERSION N/A 04/23/2020   Procedure: TRANSESOPHAGEAL ECHOCARDIOGRAM (TEE);  Surgeon: Geralynn Rile, MD;  Location: [facility] ENDOSCOPY;  Service: Cardiovascular;  Laterality: N/A;   TUBAL LIGATION      Current Medications: Current Meds  Medication Sig   aspirin EC 81 MG tablet Take 1 tablet (81 mg total) by mouth daily. Swallow whole.  (Patient taking differently: Take 162 mg by mouth daily. Swallow whole.)   Cholecalciferol 50 MCG (2000 UT) TABS Take 2,000 Units by mouth daily.   clopidogrel (PLAVIX) 75 MG tablet TAKE 1 TABLET BY MOUTH DAILY.   cyanocobalamin (,VITAMIN B-12,) 1000 MCG/ML injection Inject 1,000 mcg into the muscle every 30 (thirty) days.   escitalopram (LEXAPRO) 10 MG tablet Take by mouth.   ezetimibe (ZETIA) 10 MG tablet Take 1 tablet (10 mg total) by mouth daily.   levothyroxine (SYNTHROID) 75 MCG tablet Take 75 mcg by mouth daily.   metoprolol succinate (TOPROL-XL) 25 MG 24 hr tablet Take 25 mg by mouth daily.   nitroGLYCERIN (NITROSTAT) 0.4 MG SL tablet Place 1 tablet (0.4 mg total) under the tongue every 5 (five) minutes as needed for chest pain.   pantoprazole (PROTONIX) 40 MG tablet Take 40 mg by mouth daily.   ramipril (ALTACE) 5 MG capsule Take 5 mg by mouth daily.   rosuvastatin (CRESTOR) 40 MG tablet Take 1 tablet (40 mg total) by mouth daily.     Allergies:   Sulfa antibiotics   Social History   Socioeconomic History   Marital status: Married    Spouse name: Not on file   Number of children: Not on file   Years of education: Not on file   Highest education level: Not on file  Occupational History   Not on file  Tobacco Use   Smoking status: Former    Packs/day: 1.50    Types: Cigarettes    Start date: 03/22/1973    Quit date: 04/27/2008    Years since quitting: 13.2   Smokeless tobacco: Never  Vaping Use   Vaping Use: Never used  Substance and Sexual Activity   Alcohol use: No    Alcohol/week: 0.0 standard drinks of alcohol   Drug use: Never   Sexual activity: Yes    Birth control/protection: Post-menopausal  Other Topics Concern   Not on file  Social History Narrative   Not on file   Social Determinants of Health   Financial Resource Strain: Not on file  Food Insecurity: Not on file  Transportation Needs: Not on file  Physical Activity: Not on file  Stress: Not on file   Social Connections: Not on file     Family History: The patient's family history includes Breast cancer in an other family member; Diabetes in her maternal grandfather and mother; Hypertension in her mother; Stroke in her maternal grandfather and maternal grandmother.  ROS:   Please see the history of present illness.     All other systems reviewed and are negative.  EKGs/Labs/Other Studies Reviewed:    The  following studies were reviewed today:   EKG:     Recent Labs: 06/13/2021: ALT 17; BUN 18; Creatinine, Ser 0.90; Hemoglobin 13.6; Platelets 230; Potassium 4.1; Sodium 140  Recent Lipid Panel    Component Value Date/Time   CHOL 160 06/13/2021 0800   TRIG 98 06/13/2021 0800   HDL 56 06/13/2021 0800   CHOLHDL 2.9 06/13/2021 0800   VLDL 20 06/13/2021 0800   LDLCALC 84 06/13/2021 0800     Risk Assessment/Calculations:       Physical Exam:    Physical Exam: Blood pressure 132/80, pulse 89, height '5\' 5"'$  (1.651 m), weight 220 lb (99.8 kg), SpO2 98 %.  GEN:  Well nourished, well developed in no acute distress HEENT: Normal NECK: No JVD; No carotid bruits LYMPHATICS: No lymphadenopathy CARDIAC: RRR , no murmurs, rubs, gallops RESPIRATORY:  Clear to auscultation without rales, wheezing or rhonchi  ABDOMEN: Soft, non-tender, non-distended MUSCULOSKELETAL:  No edema; No deformity  SKIN: Warm and dry NEUROLOGIC:  Alert and oriented x 3   ASSESSMENT:    1. Cerebrovascular accident (CVA), unspecified mechanism ([place])   2. Acute stroke due to ischemia ([place])   3. Coronary artery disease of native artery of native heart with stable angina pectoris (HCC)     PLAN:       Coronary artery disease:   old ant. MI    2.  History of CVA:   She has a history of anteroapical myocardial infarction.  She has an area of dyskinesis in the anterior apex.  Recent echo in May did not show a thrombus but I am concerned that if she has mural thrombus in that dyskinetic area that this  may be an etiology for her stroke.  We will plan on getting an MRI to evaluate her LV and to look for thrombus in the apex.  If she does have thrombus I think she should be started on DOAC Or Coumadin and DC the ASA and plavix    3.  Hyperlipidemia: Her last LDL was in the 80s.  Her goal LDL is 50-70.  We will add Zetia 10 mg a day. Recheck in 6 months         Medication Adjustments/Labs and Tests Ordered: Current medicines are reviewed at length with the patient today.  Concerns regarding medicines are outlined above.  Orders Placed This Encounter  Procedures   MR CARDIAC MORPHOLOGY W WO CONTRAST   ALT   Lipid panel   Meds ordered this encounter  Medications   ezetimibe (ZETIA) 10 MG tablet    Sig: Take 1 tablet (10 mg total) by mouth daily.    Dispense:  90 tablet    Refill:  3    Patient Instructions  Medication Instructions:  START Zetia (Ezetimbe) daily for LDL goal <70 *If you need a refill on your cardiac medications before your next appointment, please call your pharmacy*   Lab Work: LIPIDS, ALT in 6 months If you have labs (blood work) drawn today and your tests are completely normal, you will receive your results only by: [place] (if you have MyChart) OR A paper copy in the mail If you have any lab test that is abnormal or we need to change your treatment, we will call you to review the results.   Testing/Procedures: Cardiac MRI Your physician has requested that you have a cardiac MRI. Cardiac MRI uses a computer to create images of your heart as its beating, producing both still and moving pictures of  your heart and major blood vessels. For further information please visit [email or web address].[email or web address] Please follow the instruction sheet given to you today for more information.   Follow-Up: At [facility], you and your health needs are our priority.  As part of our continuing mission to provide you with exceptional heart care, we have created designated  Provider Care Teams.  These Care Teams include your primary Cardiologist (physician) and Advanced Practice Providers (APPs -  Physician Assistants and Nurse Practitioners) who all work together to provide you with the care you need, when you need it.  Your next appointment:   6 month(s)  The format for your next appointment:   In Person  Provider:   Ronn Melena, or Quisha Mabie {     Important Information About Sugar         Signed, Mertie Moores, MD  07/26/2021 5:31 PM    [place]

## 2021-07-26 ENCOUNTER — Encounter: Payer: Self-pay | Admitting: Cardiovascular Disease

## 2021-07-26 ENCOUNTER — Ambulatory Visit (INDEPENDENT_AMBULATORY_CARE_PROVIDER_SITE_OTHER): Payer: 59 | Admitting: Cardiovascular Disease

## 2021-07-26 VITALS — BP 132/80 | HR 89 | Ht 65.0 in | Wt 220.0 lb

## 2021-07-26 DIAGNOSIS — I639 Cerebral infarction, unspecified: Secondary | ICD-10-CM

## 2021-07-26 DIAGNOSIS — I25118 Atherosclerotic heart disease of native coronary artery with other forms of angina pectoris: Secondary | ICD-10-CM | POA: Diagnosis not present

## 2021-07-26 MED ORDER — EZETIMIBE 10 MG PO TABS: 10.0000 mg | ORAL_TABLET | Freq: Every day | ORAL | 3 refills | Status: DC

## 2021-07-26 NOTE — Patient Instructions (Signed)
Medication Instructions:  START Zetia (Ezetimbe) daily for LDL goal <70 *If you need a refill on your cardiac medications before your next appointment, please call your pharmacy*   Lab Work: LIPIDS, ALT in 6 months If you have labs (blood work) drawn today and your tests are completely normal, you will receive your results only by: [place] (if you have MyChart) OR A paper copy in the mail If you have any lab test that is abnormal or we need to change your treatment, we will call you to review the results.   Testing/Procedures: Cardiac MRI Your physician has requested that you have a cardiac MRI. Cardiac MRI uses a computer to create images of your heart as its beating, producing both still and moving pictures of your heart and major blood vessels. For further information please visit [email or web address]-[email or web address].[email or web address] Please follow the instruction sheet given to you today for more information.   Follow-Up: At [facility], you and your health needs are our priority.  As part of our continuing mission to provide you with exceptional heart care, we have created designated Provider Care Teams.  These Care Teams include your primary Cardiologist (physician) and Advanced Practice Providers (APPs -  Physician Assistants and Nurse Practitioners) who all work together to provide you with the care you need, when you need it.  Your next appointment:   6 month(s)  The format for your next appointment:   In Person  Provider:   Ronn Melena, or [place] {     Important Information About Sugar

## 2021-08-07 ENCOUNTER — Encounter: Payer: Self-pay | Admitting: Adult Health

## 2021-08-07 ENCOUNTER — Ambulatory Visit (INDEPENDENT_AMBULATORY_CARE_PROVIDER_SITE_OTHER): Payer: 59 | Admitting: Adult Health

## 2021-08-07 VITALS — BP 134/67 | HR 77 | Ht 65.0 in | Wt 221.2 lb

## 2021-08-07 DIAGNOSIS — I69320 Aphasia following cerebral infarction: Secondary | ICD-10-CM | POA: Diagnosis not present

## 2021-08-07 DIAGNOSIS — Z09 Encounter for follow-up examination after completed treatment for conditions other than malignant neoplasm: Secondary | ICD-10-CM

## 2021-08-07 DIAGNOSIS — I63512 Cerebral infarction due to unspecified occlusion or stenosis of left middle cerebral artery: Secondary | ICD-10-CM | POA: Diagnosis not present

## 2021-08-07 DIAGNOSIS — G459 Transient cerebral ischemic attack, unspecified: Secondary | ICD-10-CM | POA: Diagnosis not present

## 2021-08-07 NOTE — Progress Notes (Signed)
[name] [address] [place]. [place] [address] [phone]       STROKE FOLLOW UP NOTE  [name] Date of Birth:  18-Jan-1956 Medical Record Number:  [id number]   Reason for Referral: Hospital stroke follow up    SUBJECTIVE:   CHIEF COMPLAINT:  Chief Complaint  Patient presents with   Follow-up    Pt reports feeling okay. She states she hasn't had anymore episodes since her last stroke. Her cardiologist sent you a note about something that she wanted to discuss with you. Room 2 alone      HPI:   Update 08/07/2021 JM: Patient returns for hospital follow-up visit.  She was previously seen 2 months ago and doing well at that time with only mild speech difficulty and was advised to follow-up as needed.  Unfortunately, she presented to ED on 5/18 with acute worsening of speech impairment and dizziness as well as a mild headache. Symptoms resolved within 60 minutes. CTH and MR brain negative for acute infarct.  CTA head/neck negative LVO.  2D echo showed distal septal dyskinesis with EF 45 to 50% loop recorder interrogated which did not note any significant arrhythmias.  LDL 84.  A1c 5.4.  Diagnosed with likely TIA, on plavix and Crestor 5 mg daily (recent intolerance to higher dose and recently restarted at '5mg'$  daily dosing) PTA, recommended DAPT continuation of Crestor and ensure outpatient follow-up.   Since discharge, she has been doing well.  Speech is currently at baseline with only mild speech difficulty.  Continued occasional dizziness but at baseline.  She denies any new or worsening stroke/TIA symptoms. She does endorse occasional mild headaches but this is not abnormal with weather changes.   She has remained on both aspirin and Plavix as well as Crestor without side effects.  Blood pressure today 134/67.  Loop recorder has not shown atrial fibrillation thus far but is concerned regarding the monthly cost (~$200/month).  Recently seen by  cardiologist Dr. Sharrell Ku, OV note reviewed, noted area of  in the anterior apex, no evidence of thrombus on recent echo but concern that if she has more thrombus in the dyskinetic area that may be the etiology for her stroke.  He plans on obtaining cardiac MRI to evaluate LV and potential thrombus in the apex, if present, he recommends initiating DOAC or warfarin and discontinuing aspirin and Plavix. He also added Zetia 10 mg daily in addition to Crestor 5 mg daily and plans on rechecking in 6 months.   No further concerns at this time.     History provided for reference purposes only Update 06/10/2021 JM: Patient returns for 85-monthstroke follow-up.  Overall stable since prior visit without new stroke/TIA symptoms.  Reports residual mild speech difficulty and at times difficulty with numbers and reading.  She is currently working full-time but plans on retiring in March 2024.   She does mention at times will have pain/tenderness on top of head when she places her sunglasses on top of her head.  She does not have any issues with pain on side of head or back of head.   Compliant on Plavix without side effects.  Noted difficulty tolerating Crestor 40 mg daily due to myalgias which resolved after stopping, had f/u with cardiology last week, recommended restarting low-dose Crestor at 5 mg daily which was started 2 days ago, has not had any issues since restarting.  Cardiology plans on rechecking labs in 4 weeks.  Blood pressure today 121/71.  Loop recorder has not shown atrial fibrillation thus far.  No further concerns at this time.   Update 12/11/2020 JM: Returns for 43-monthstroke follow-up unaccompanied  Overall doing well since prior visit -denies new stroke/TIA symptoms Mild residual speech difficulties and mild delayed recall but overall improved since prior visit Completed therapy 9/7 and successfully returned back to work initially started part time and now full time since end of September -  does have slight increased fatigue towards end of the day but otherwise no issues  Denies continued headaches but does report "soreness/tenderness" bilaterally posterior temporalis and occipital area and occasional pains that shoot from occipital area bilaterally. Does have increased stiffness/tightness in neck, shoulders and upper and lower trapezius.  Referred to PT for dry needling shortly after prior visit but was not called to schedule and she did not pursue.  Compliant on Plavix - mild bruising but no other side effects Compliant on Crestor -denies side effects Blood pressure today 106/76 Loop recorder has not shown atrial fibrillation thus far  No further concerns at this time   Update 08/29/2020 JM: Mrs. HVanderpoolreturns for 331-monthtroke follow-up accompanied by her daughter, Tracey [name] Greatest concern today is in regards to continued daily headaches and dizziness.  Continue daily headaches fluctuating in severity typically left parietal region and right occipital.  She does endorse neck tension.  At times can be associated with photophobia and phonophobia.  Can worsen with increased stressors.  Reports dizziness present since her stroke but seem to worsen shortly after when she was switched from Celexa to Lexapro and more recently after starting Topamax as well as increased difficulty focusing and brain fog sensation.  Hx of ocular migraines possibly occurring 3 [place] ago [place].  [facility]asia and apraxia with some improvement noted but can worsen with increased stress, emotion or fatigue.  Increased fatigue noted after high language demands.  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  Compliant on Plavix and Crestor without associated side effects.  Blood pressure  today 122/64.  Loop recorder has not shown atrial fibrillation thus far.  No further concerns at this time.  Initial visit 05/28/2020 JM: Mrs. HaHaskews being seen for hospital follow-up accompanied by her husband, RiAudry [name]  Reports continued speech difficulty including difficulty with reading and writing but has been making progress.  She is currently on short-term disability provided by Dr. XuErlinda Honguring hospitalization and requests extension.  Previously working as an acOptometrist She is also been experiencing daily headaches appear more tension related to have been present since her stroke.  She does have remote history of migraines without any recent migraine headaches and current headaches different from prior migraines.  She has been using Tylenol daily without much benefit.  Denies new stroke/TIA symptoms  Completed 3 weeks DAPT and remains on Plavix alone without associated side effects Remains on Crestor without associated side effects Blood pressure today 136/66 Loop recorder has not shown atrial fibrillation thus far  No further concerns at this time  Stroke admission 04/18/2020 Ms. PaSAVON COBBSs a [age].o. female with history of CAD s/p MI and cardiac arrest with stentins 04/2008, restenting 11/2018, claudication, HTN, hypothryoidism, HLD, and anxiety who presented on 04/18/2020 with aphasia. She  was at work and seem to go into the bathroom at 9:15 AM.  Later around 9:20 AM she was found on the floor aphasic and with some right-sided weakness.  She received tPA at [facility] with near the end of infusion staff thought that her speech worsened so tPA stopped. CTH negative for ICH and transferred to [facility].  Stroke work-up revealed left MCA M2 branch stroke secondary to large vessel disease vs cardioembolic source.  Underwent successful revascularization by IR for M2 occlusion.  Loop recorder placed.  LDL 51 on Crestor 40 mg daily.  HTN stable.  Other stroke risk factors include former  tobacco use stopped after MI, family history of stroke and CAD with MI in 2010.  Recommended DAPT for 3 weeks and Plavix alone.  Residual aphasia and alexia.  Evaluated by therapies who recommended outpatient PT/SLP and discharged home in stable condition.   Stroke - Left MCA M2 branch stroke secondary to large vessel disease vs. cardioembolic source CT head Code Stroke - Small amount of white matter hypodensity within the posterior left frontal lobe. In the setting of an acute distal left M2 MCA branch occlusion CT head - No evidence of acute intracranial hemorrhage status post tPA. No demarcated cortical infarct. As before, there is subtle hypodensity within the posterior left frontal lobe white matter. In the setting of an acute distal left M2 MCA branch occlusion CTA head & neck - Occluded distal M2 left MCA branch vessel. Mild non-stenotic calcified plaque within the intracranial ICAs bilaterally. CT perfusion - The perfusion software identifies a 16 mL region of hypoperfusion within the posterior left MCA territory utilizing the Tmax>6 seconds threshold. The perfusion software identifies no core infarct. Mismatch volume: 16 mL. MRI moderate size acute infarction in left parietal lobe with minimal additional small acute left parietal cortical infarcts.  No evidence of hemorrhagic transformation.  Mild chronic microvascular ischemic changes MRA head preserved but decreased flow related enhancement at the site of distal left M2/M3 MCA thrombus on prior CTA 2D Echo - EF:45-50%. No wall motion abnormalities. Mid to apical anteroseptal akinesis. DVT US - No evidence of DVT bilaterally TEE interatrial septum aneurysmal.  Bubble study positive consistent with small to moderate sized PFO; no evidence of thrombus Loop recorder placed LDL 51 HgbA1c 5.4 aspirin 81 mg daily prior to admission, now on aspirin 81 mg daily and clopidogrel 75 mg daily for 3 weeks then continue with Plavix alone. Therapy  recommendations:  outpt PT/SLP Disposition:  home      ROS:   14 system review of systems performed and negative with exception of those listed in HPI  PMH:  Past Medical History:  Diagnosis Date   Anxiety    Bronchitis    CAD (coronary artery disease)    a. 04/2008: cariac arrest with prox LAD occ s/p stenting, arctic sun. b. 11/2008: restenosis s/p repeat stenting. c. 01/2011: abnormal stress test but cath reportedly OK.   Dysplastic nevus 11/04/2007   Ant. chest. Slight to moderate atypia, extends to one edge.    Dysplastic nevus 01/03/2009   Left breast. Minimal atypia, close to margin.   Hypothyroid    Myocardial infarction [facility]) 2010   stents and cardiac rehab    Stroke [facility])     PSH:  Past Surgical History:  Procedure Laterality Date   ANGIOPLASTY  2010   2 stents (femoral)    BUBBLE STUDY  04/23/2020   Procedure: BUBBLE STUDY;  Surgeon: Geralynn Rile, MD;  Location:  [facility];  Service: Cardiovascular;;   IR CT HEAD LTD  04/18/2020   IR PERCUTANEOUS ART THROMBECTOMY/INFUSION INTRACRANIAL INC DIAG ANGIO  04/18/2020   LOOP RECORDER INSERTION N/A 04/23/2020   Procedure: LOOP RECORDER INSERTION;  Surgeon: Vickie Epley, MD;  Location: [place] [facility];  Service: Cardiovascular;  Laterality: N/A;   RADIOLOGY WITH ANESTHESIA N/A 04/18/2020   Procedure: IR WITH ANESTHESIA;  Surgeon: Radiologist, Medication, MD;  Location: [place];  Service: Radiology;  Laterality: N/A;   TEE WITHOUT CARDIOVERSION N/A 04/23/2020   Procedure: TRANSESOPHAGEAL ECHOCARDIOGRAM (TEE);  Surgeon: Geralynn Rile, MD;  Location: [place];  Service: Cardiovascular;  Laterality: N/A;   TUBAL LIGATION      Social History:  Social History   Socioeconomic History   Marital status: Married    Spouse name: Not on file   Number of children: Not on file   Years of education: Not on file   Highest education level: Not on file  Occupational History   Not on file  Tobacco Use    Smoking status: Former    Packs/day: 1.50    Types: Cigarettes    Start date: 03/22/1973    Quit date: 04/27/2008    Years since quitting: 13.2   Smokeless tobacco: Never  Vaping Use   Vaping Use: Never used  Substance and Sexual Activity   Alcohol use: No    Alcohol/week: 0.0 standard drinks of alcohol   Drug use: Never   Sexual activity: Yes    Birth control/protection: Post-menopausal  Other Topics Concern   Not on file  Social History Narrative   Not on file   Social Determinants of Health   Financial Resource Strain: Not on file  Food Insecurity: Not on file  Transportation Needs: Not on file  Physical Activity: Not on file  Stress: Not on file  Social Connections: Not on file  Intimate Partner Violence: Not on file    Family History:  Family History  Problem Relation Age of Onset   Diabetes Mother    Hypertension Mother    Stroke Maternal Grandmother    Diabetes Maternal Grandfather    Stroke Maternal Grandfather    Breast cancer Other     Medications:   Current Outpatient Medications on File Prior to Visit  Medication Sig Dispense Refill   aspirin EC 81 MG tablet Take 1 tablet (81 mg total) by mouth daily. Swallow whole. (Patient taking differently: Take 162 mg by mouth daily. Swallow whole.) 30 tablet 12   Cholecalciferol 50 MCG (2000 UT) TABS Take 2,000 Units by mouth daily.     clopidogrel (PLAVIX) 75 MG tablet TAKE 1 TABLET BY MOUTH DAILY. 30 tablet 0   cyanocobalamin (,VITAMIN B-12,) 1000 MCG/ML injection Inject 1,000 mcg into the muscle every 30 (thirty) days.     escitalopram (LEXAPRO) 10 MG tablet Take by mouth.     ezetimibe (ZETIA) 10 MG tablet Take 1 tablet (10 mg total) by mouth daily. 90 tablet 3   levothyroxine (SYNTHROID) 75 MCG tablet Take 75 mcg by mouth daily.     metoprolol succinate (TOPROL-XL) 25 MG 24 hr tablet Take 25 mg by mouth daily.     nitroGLYCERIN (NITROSTAT) 0.4 MG SL tablet Place 1 tablet (0.4 mg total) under the tongue every 5  (five) minutes as needed for chest pain. 25 tablet 3   pantoprazole (PROTONIX) 40 MG tablet Take 40 mg by mouth daily.     ramipril (ALTACE) 5 MG capsule Take 5  mg by mouth daily.     rosuvastatin (CRESTOR) 40 MG tablet Take 1 tablet (40 mg total) by mouth daily. 90 tablet 3   No current facility-administered medications on file prior to visit.    Allergies:   Allergies  Allergen Reactions   Sulfa Antibiotics Hives      OBJECTIVE:  Physical Exam  Vitals:   08/07/21 0901  BP: 134/67  Pulse: 77  Weight: 221 lb 4 oz (100.4 kg)  Height: '5\' 5"'$  (1.651 m)   Body mass index is 36.82 kg/m. No results found.   General: well developed, well nourished,  very pleasant middle-age Caucasian female, seated, in no evident distress Head: head normocephalic and atraumatic.   Neck: supple with no carotid or supraclavicular bruits Cardiovascular: regular rate and rhythm, no murmurs Musculoskeletal: No deformity Skin:  no rash/petichiae Vascular:  Normal pulses all extremities   Neurologic Exam Mental Status: Awake and fully alert. Mild aphasia and verbal apraxia - stable. Able to follow commands without difficulty.  Able to name objects without difficulty. Oriented to place and time. Recent and remote memory intact. Attention span, concentration and fund of knowledge appropriate during visit. Mood and affect appropriate.  Cranial Nerves: Pupils equal, briskly reactive to light. Extraocular movements full without nystagmus. Visual fields full to confrontation. Hearing intact. Facial sensation intact. Face, tongue, palate moves normally and symmetrically.  Motor: Normal bulk and tone. Normal strength in all tested extremity muscles Sensory.: intact to touch , pinprick , position and vibratory sensation.  Coordination: Rapid alternating movements normal in all extremities. Finger-to-nose and heel-to-shin performed accurately bilaterally. Gait and Station: Arises from chair without difficulty.  Stance is normal. Gait demonstrates normal stride length and balance without use of assistive device. Tandem walk and heel toe with mild difficulty.  Romberg negative. Reflexes: 1+ and symmetric. Toes downgoing.          ASSESSMENT: TIARIA BIBY is a 66 y.o. year old female with history of left MCA infarct s/p TPA and IR in 03/2020 secondary to large vessel disease vs cardioembolic source s/p ILR with residual mild aphasia and more recent likely TIA in 05/2021 after episode of worsening aphasia and dizziness. Vascular risk factors include +PFO on TEE, HTN, HLD, CAD, MI in 2010, former tobacco use (stopped after MI in 2010) and obesity.     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  Advised to discontinue aspirin as 3 weeks DAPT completed. Plans on undergoing cardiac MRI  on 9/13 to evaluate for possible thrombus with area of dyskinesis which possibly contributed to prior stroke and recent TIA. If present, cardiology plans on initiating DOAC or warfarin and discontinuing Plavix Discussed secondary stroke prevention measures and importance of close PCP follow up for aggressive stroke risk factor management including BP goal<130/90, and HLD with LDL goal<70 Stroke labs 05/2021: LDL 84, A1c 5.4     Follow-up in 4 months or call earlier if needed   CC:  PCP: Rusty Aus, MD    I spent 46 minutes of face-to-face and non-face-to-face time with patient.  This  included previsit chart review including review of recent hospitalization, lab review,  study review, electronic health record documentation, patient education and discussion regarding recent TIA and prior stroke with residual deficits, secondary stroke prevention measures and aggressive stroke risk factor management, cardiac MRI and indication and answered all other questions to patient satisfaction   [name], AGNP-BC  [facility] [address] [place], [place] [address]  Phone [phone] Fax [phone] Note: This document was prepared with digital dictation and possible smart phrase technology. Any transcriptional errors that result from this process are unintentional.

## 2021-08-07 NOTE — Patient Instructions (Addendum)
Continue clopidogrel 75 mg daily  and Crestor 5 mg daily and Zetia 10 mg daily for secondary stroke prevention  Please stop aspirin at this time as no longer indicated  Continue to follow with cardiology and proceed with cardiac MRI in September as scheduled  Continue to follow up with PCP regarding cholesterol and blood pressure management  Maintain strict control of hypertension with blood pressure goal below 130/90 and cholesterol with LDL cholesterol (bad cholesterol) goal below 70 mg/dL.   Signs of a Stroke? Follow the BEFAST method:  Balance Watch for a sudden loss of balance, trouble with coordination or vertigo Eyes Is there a sudden loss of vision in one or both eyes? Or double vision?  Face: Ask the person to smile. Does one side of the face droop or is it numb?  Arms: Ask the person to raise both arms. Does one arm drift downward? Is there weakness or numbness of a leg? Speech: Ask the person to repeat a simple phrase. Does the speech sound slurred/strange? Is the person confused ? Time: If you observe any of these signs, call 911.     Followup in the future with me in 4 months or call earlier if needed       Thank you for coming to see [place] at [facility]. I hope we have been able to provide you high quality care today.  You may receive a patient satisfaction survey over the next few weeks. We would appreciate your feedback and comments so that we may continue to improve ourselves and the health of our patients.    Transient Ischemic Attack A transient ischemic attack (TIA) causes the same symptoms as a stroke, but the symptoms go away quickly. A TIA happens when blood flow to the brain is blocked. Having a TIA means you may be at risk for a stroke. A TIA is a medical emergency. What are the causes? A TIA is caused by a blocked artery in the head or neck. This means the brain does not get the blood supply it needs. A blockage can be caused by: Fatty  buildup in an artery in the head or neck. A blood clot. A tear in an artery. Irritation and swelling (inflammation) of an artery. Sometimes the cause is not known. What increases the risk? Certain things may make you more likely to have a TIA. Some of these are things that you can change, such as: Being very overweight. Using products that have nicotine or tobacco. Taking birth control pills. Not being active. Drinking too much alcohol. Using drugs. Health conditions that may increase your risk include: High blood pressure. High cholesterol. Diabetes. Heart disease. A heartbeat that is not regular (atrial fibrillation). Sickle cell disease. Sleep problems (sleep apnea). Long-term diseases that cause irritation and swelling. Problems with blood clotting. Other risk factors include: Being over the age of 40. Being female. Having a family history of stroke. Having had blood clots, stroke, TIA, or heart attack in the past. Having a history of high blood pressure when pregnant (preeclampsia). Very bad headaches (migraines). What are the signs or symptoms? The symptoms of a TIA are like those of a stroke. They can include: Weakness or loss of feeling in your face, arm, or leg. This often happens on one side of your body. Trouble walking. Trouble moving your arms or legs. Trouble talking or understanding what people are saying. Problems with how you see. Feeling dizzy. Feeling confused. Loss of balance or coordination. Feeling like  you may vomit (nausea) or you vomit. Having a very bad headache. If you can, note what time you started to have symptoms. Tell your doctor. How is this treated? The goal of treatment is to lower the risk for a stroke. This may include: Changes to diet and lifestyle, such as getting regular exercise and stopping smoking. Taking medicines to: Thin the blood. Lower blood pressure. Lower cholesterol. Treating other health conditions, such as  diabetes. If testing shows that an artery in your brain is narrow, your doctor may recommend a procedure to: Take the blockage out of your artery (carotid endarterectomy). Open or widen an artery in your neck (carotid angioplasty and stenting). Follow these instructions at home: Medicines Take over-the-counter and prescription medicines only as told by your doctor. If you were told to take aspirin or another medicine to thin your blood, take it exactly as told by your doctor. Taking too much of the medicine can cause bleeding. Taking too little of the medicine may not work to treat the problem. Eating and drinking  Eat 5 or more servings of fruits and vegetables each day. Follow instructions from your doctor about your diet. You may need to follow a certain diet to help lower your risk of a stroke. You may need to: Eat a diet that is low in fat and salt. Eat foods with a lot of fiber. Limit carbohydrates and sugar. If you drink alcohol: Limit how much you have to: 0-1 drink a day for women who are not pregnant. 0-2 drinks a day for men. Know how much alcohol is in a drink. In the [place] one drink equals one 12 oz bottle of beer (354m), one 5 oz glass of wine (1448m, or one 1 oz glass of hard liquor (4414m General instructions Keep a healthy weight. Try to get at least 30 minutes of exercise on most days. Get treatment if you have sleep problems. Do not smoke or use any products that contain nicotine or tobacco. If you need help quitting, ask your doctor. Do not use drugs. Keep all follow-up visits. Where to find more information American Stroke Association: www.stroke.org Get help right away if: You have chest pain. You have a heartbeat that is not regular. You have any signs of a stroke. "BE FAST" is an easy way to remember the main warning signs: B - Balance. Dizziness, sudden trouble walking, or loss of balance. E - Eyes. Trouble seeing or a change in how you see. F - Face.  Sudden weakness or loss of feeling of the face. The face or eyelid may droop on one side. A - Arms. Weakness or loss of feeling in an arm. This happens all of a sudden and most often on one side of the body. S - Speech. Sudden trouble speaking, slurred speech, or trouble understanding what people say. T - Time. Time to call emergency services. Write down what time symptoms started. You have other signs of a stroke, such as: A sudden, very bad headache with no known cause. Feeling like you may vomit. Vomiting. A seizure. These symptoms may be an emergency. Get help right away. Call your local emergency services (911 in the [place] Do not wait to see if the symptoms will go away. Do not drive yourself to the hospital. Summary A transient ischemic attack (TIA) happens when an artery in the head or neck is blocked. This causes the same symptoms as a stroke. The symptoms go away quickly. A TIA is a medical emergency.  Get help right away, even if your symptoms go away. Having a TIA means that you may be at risk for a stroke. Taking medicines and making diet and lifestyle changes can help to prevent a stroke. This information is not intended to replace advice given to you by your health care provider. Make sure you discuss any questions you have with your health care provider. Document Revised: 08/09/2019 Document Reviewed: 08/09/2019 Elsevier Patient Education  [employer].

## 2021-08-11 ENCOUNTER — Other Ambulatory Visit: Payer: Self-pay | Admitting: Cardiovascular Disease

## 2021-08-13 NOTE — Progress Notes (Signed)
I agree with the above plan 

## 2021-10-08 ENCOUNTER — Telehealth (HOSPITAL_COMMUNITY): Payer: Self-pay | Admitting: Emergency Medicine

## 2021-10-08 NOTE — Telephone Encounter (Signed)
Reaching out to patient to offer assistance regarding upcoming cardiac imaging study; pt verbalizes understanding of appt date/time, parking situation and where to check in, pre-test NPO status and medications ordered, and verified current allergies; name and call back number provided for further questions should they arise Tracey Bond RN Navigator Cardiac Imaging [name] [phone] office [phone] cell  Arrival 730, w/c entrance Difficult iv  Denies metals other than stent and loop recorder Some claustro- can tolerate without meds

## 2021-10-09 ENCOUNTER — Ambulatory Visit (HOSPITAL_COMMUNITY)
Admission: RE | Admit: 2021-10-09 | Discharge: 2021-10-09 | Disposition: A | Discharge status: Home / Self Care | Payer: 59 | Source: Ambulatory Visit | Attending: Cardiovascular Disease | Admitting: Cardiovascular Disease

## 2021-10-09 ENCOUNTER — Other Ambulatory Visit: Payer: Self-pay | Admitting: Cardiovascular Disease

## 2021-10-09 DIAGNOSIS — I639 Cerebral infarction, unspecified: Secondary | ICD-10-CM

## 2021-10-09 DIAGNOSIS — I25118 Atherosclerotic heart disease of native coronary artery with other forms of angina pectoris: Secondary | ICD-10-CM

## 2021-10-09 MED ORDER — GADOBUTROL 1 MMOL/ML IV SOLN
8.5000 mL | Freq: Once | INTRAVENOUS | Status: AC | PRN
  Administered 2021-10-09: 8.5 mL via INTRAVENOUS

## 2021-11-19 ENCOUNTER — Other Ambulatory Visit: Payer: Self-pay | Admitting: Internal Medicine

## 2021-11-19 DIAGNOSIS — Z1231 Encounter for screening mammogram for malignant neoplasm of breast: Secondary | ICD-10-CM

## 2021-11-25 ENCOUNTER — Encounter (INDEPENDENT_AMBULATORY_CARE_PROVIDER_SITE_OTHER): Payer: Self-pay

## 2021-12-12 ENCOUNTER — Ambulatory Visit: Payer: 59 | Admitting: Adult Health

## 2021-12-16 NOTE — Progress Notes (Unsigned)
Electrophysiology Office Note Date: 12/16/2021  ID:  Tracey [name], DOB August 22, 1955, MRN [id number]  PCP: Rusty Aus, MD Primary Cardiologist: None Electrophysiologist: Vickie Epley, MD   CC: ILR follow-up  Tracey [name] is a 66 y.o. female seen today for Dr. Quentin Ore . she presents today for routine electrophysiology followup. Since last being seen in our clinic the patient reports doing very well.  she denies chest pain, palpitations, dyspnea, PND, orthopnea, nausea, vomiting, dizziness, syncope, edema, weight gain, or early satiety. .  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 04/2008: cariac arrest with prox LAD occ s/p stenting, arctic sun. b. 11/2008: restenosis s/p repeat stenting. c. 01/2011: abnormal stress test but cath reportedly OK.   Dysplastic nevus 11/04/2007   Ant. chest. Slight to moderate atypia, extends to one edge.    Dysplastic nevus 01/03/2009   Left breast. Minimal atypia, close to margin.   Hypothyroid    Myocardial infarction [facility]) 2010   stents and cardiac rehab    Stroke [facility])    Past Surgical History:  Procedure Laterality Date   ANGIOPLASTY  2010   2 stents (femoral)    BUBBLE STUDY  04/23/2020   Procedure: BUBBLE STUDY;  Surgeon: Geralynn Rile, MD;  Location: [place];  Service: Cardiovascular;;   IR CT HEAD LTD  04/18/2020   IR PERCUTANEOUS ART THROMBECTOMY/INFUSION INTRACRANIAL INC DIAG ANGIO  04/18/2020   LOOP RECORDER INSERTION N/A 04/23/2020   Procedure: LOOP RECORDER INSERTION;  Surgeon: Vickie Epley, MD;  Location: [place] CV LAB;  Service: Cardiovascular;  Laterality: N/A;   RADIOLOGY WITH ANESTHESIA N/A 04/18/2020   Procedure: IR WITH ANESTHESIA;  Surgeon: Radiologist, Medication, MD;  Location: [place];  Service: Radiology;  Laterality: N/A;   TEE WITHOUT  CARDIOVERSION N/A 04/23/2020   Procedure: TRANSESOPHAGEAL ECHOCARDIOGRAM (TEE);  Surgeon: Geralynn Rile, MD;  Location: [place];  Service: Cardiovascular;  Laterality: N/A;   TUBAL LIGATION      Current Outpatient Medications  Medication Sig Dispense Refill   Cholecalciferol 50 MCG (2000 UT) TABS Take 2,000 Units by mouth daily.     clopidogrel (PLAVIX) 75 MG tablet TAKE 1 TABLET BY MOUTH DAILY 90 tablet 3   cyanocobalamin (,VITAMIN B-12,) 1000 MCG/ML injection Inject 1,000 mcg into the muscle every 30 (thirty) days.     escitalopram (LEXAPRO) 10 MG tablet Take by mouth.     ezetimibe (ZETIA) 10 MG tablet Take 1 tablet (10 mg total) by mouth daily. 90 tablet 3   levothyroxine (SYNTHROID) 75 MCG tablet Take 75 mcg by mouth daily.     metoprolol succinate (TOPROL-XL) 25 MG 24 hr tablet Take 25 mg by mouth daily.     nitroGLYCERIN (NITROSTAT) 0.4 MG SL tablet Place 1 tablet (0.4 mg total) under the tongue every 5 (five) minutes as needed for chest pain. 25 tablet 3   pantoprazole (PROTONIX) 40 MG tablet Take 40 mg by mouth daily.     ramipril (ALTACE) 5 MG capsule Take 5 mg by mouth daily.     rosuvastatin (CRESTOR) 40 MG tablet Take 1 tablet (40 mg total) by mouth daily. 90 tablet 3   No current facility-administered medications for this visit.    Allergies:   Sulfa antibiotics   Social History: Social History   Socioeconomic History  Marital status: Married    Spouse name: Not on file   Number of children: Not on file   Years of education: Not on file   Highest education level: Not on file  Occupational History   Not on file  Tobacco Use   Smoking status: Former    Packs/day: 1.50    Types: Cigarettes    Start date: 03/22/1973    Quit date: 04/27/2008    Years since quitting: 13.6   Smokeless tobacco: Never  Vaping Use   Vaping Use: Never used  Substance and Sexual Activity   Alcohol use: No    Alcohol/week: 0.0 standard drinks of alcohol   Drug use: Never    Sexual activity: Yes    Birth control/protection: Post-menopausal  Other Topics Concern   Not on file  Social History Narrative   Not on file   Social Determinants of Health   Financial Resource Strain: Not on file  Food Insecurity: Not on file  Transportation Needs: Not on file  Physical Activity: Not on file  Stress: Not on file  Social Connections: Not on file  Intimate Partner Violence: Not on file    Family History: Family History  Problem Relation Age of Onset   Diabetes Mother    Hypertension Mother    Stroke Maternal Grandmother    Diabetes Maternal Grandfather    Stroke Maternal Grandfather    Breast cancer Other      Review of Systems: All other systems reviewed and are otherwise negative except as noted above.  Physical Exam: There were no vitals filed for this visit.   GEN- The patient is well appearing, alert and oriented x 3 today.   HEENT: normocephalic, atraumatic; sclera clear, conjunctiva pink; hearing intact; oropharynx clear; neck supple  Lungs- Clear to ausculation bilaterally, normal work of breathing.  No wheezes, rales, rhonchi Heart- Regular rate and rhythm, no murmurs, rubs or gallops  GI- soft, non-tender, non-distended, bowel sounds present  Extremities- no clubbing, cyanosis, or edema  MS- no significant deformity or atrophy Skin- warm and dry, no rash or lesion; ILR pocket well healed Psych- euthymic mood, full affect Neuro- strength and sensation are intact  PPM Interrogation- reviewed in detail today,  See PACEART report  EKG:  EKG is not ordered today.  Recent Labs: 06/13/2021: ALT 17; BUN 18; Creatinine, Ser 0.90; Hemoglobin 13.6; Platelets 230; Potassium 4.1; Sodium 140   Wt Readings from Last 3 Encounters:  08/07/21 221 lb 4 oz (100.4 kg)  07/26/21 220 lb (99.8 kg)  06/13/21 223 lb 8.7 oz (101.4 kg)     Other studies Reviewed: Additional studies/ records that were reviewed today include: Previous EP office notes, Previous  remote checks, Most recent labwork.   Assessment and Plan:  1. Cryptogenic Stroke s/p Medtronic Loop recorder Normal device function See [facility] Art report No changes today Monthly monitoring is very expensive for her. Will continue in person visits q 6 months.   In March she will switch to Medicare. Given CPT code today to check with medicare. Suspect her remotes will be better covered and can resume.    Current medicines are reviewed at length with the patient today.     Disposition:   Follow up with Dr. Quentin Ore  in 6 Months. Sooner with issues. I will double check with [employer] that we would still get an ALERT for new AF despite lack of monthly reports.     Jacalyn Lefevre, PA-C  12/16/2021 9:03 AM  [address] [place] [address] [phone] (office) [phone] (fax)

## 2021-12-18 ENCOUNTER — Encounter: Payer: Self-pay | Admitting: Student

## 2021-12-18 ENCOUNTER — Ambulatory Visit: Payer: 59 | Attending: Student | Admitting: Student

## 2021-12-18 VITALS — BP 128/82 | HR 86 | Ht 65.0 in | Wt 217.6 lb

## 2021-12-18 DIAGNOSIS — I255 Ischemic cardiomyopathy: Secondary | ICD-10-CM

## 2021-12-18 DIAGNOSIS — I25118 Atherosclerotic heart disease of native coronary artery with other forms of angina pectoris: Secondary | ICD-10-CM | POA: Diagnosis not present

## 2021-12-18 NOTE — Patient Instructions (Signed)
Medication Instructions:  Your physician recommends that you continue on your current medications as directed. Please refer to the Current Medication list given to you today.  *If you need a refill on your cardiac medications before your next appointment, please call your pharmacy*   Lab Work: None If you have labs (blood work) drawn today and your tests are completely normal, you will receive your results only by: [place] (if you have MyChart) OR A paper copy in the mail If you have any lab test that is abnormal or we need to change your treatment, we will call you to review the results.   Follow-Up: At [facility], you and your health needs are our priority.  As part of our continuing mission to provide you with exceptional heart care, we have created designated Provider Care Teams.  These Care Teams include your primary Cardiologist (physician) and Advanced Practice Providers (APPs -  Physician Assistants and Nurse Practitioners) who all work together to provide you with the care you need, when you need it.   Your next appointment:   6 month(s)  The format for your next appointment:   In Person  Provider:   Lars Mage, MD    Important Information About Sugar

## 2021-12-26 ENCOUNTER — Ambulatory Visit
Admission: RE | Admit: 2021-12-26 | Discharge: 2021-12-26 | Disposition: A | Discharge status: Home / Self Care | Payer: 59 | Source: Ambulatory Visit | Attending: Internal Medicine | Admitting: Internal Medicine

## 2021-12-26 DIAGNOSIS — Z1231 Encounter for screening mammogram for malignant neoplasm of breast: Secondary | ICD-10-CM | POA: Diagnosis not present

## 2022-01-06 ENCOUNTER — Encounter: Payer: Self-pay | Admitting: Cardiovascular Disease

## 2022-01-06 NOTE — Progress Notes (Unsigned)
Cardiology Office Note:    Date:  01/07/2022   ID:  Tracey [name], DOB [birth year], MRN [id number]  PCP:  Tracey Aus, [name]   [place]  Cardiologist:  Tracey [name]  Advanced Practice Provider:  No care team member to display Electrophysiologist:  Tracey [name]    :[id number]}   Referring [name]: Tracey Aus, [name]   Chief Complaint  Patient presents with   Coronary Artery Disease    May 22, 2020   Tracey [name] is a 67 y.o. female with a hx of CVA, CAD ( s/p anterior MI with stent placement in 2010) Was a smoker at the time ,   She was admitted in March with a CVA. 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 Quentin [name] She has moderate ( grade III) layered plaque in the transverse and descending aorta  She had mildly reduced LV function with apical akinesis.  Still speech is altered.  Going to speech therapy,  Some difficult with reading and writing . Still has some headaches   She is here to transfer from her cardiologist  at [facility]  No CP , no dyspnea She has some continued discomfort  from her implantable loop recorder   Has been found to have PAD   July 26, 2021: Tracey [name] is seen today for follow-up of her coronary artery disease.  (status post anterior wall MI in 2010 with stent placement to the LAD )She has a history of a CVA.  Transesophageal echo revealed an aneurysmal atrial septum with a PFO.  She has had an implantable loop placed by Dr. Quentin [name].  Doing well.  Had a loop recorder for a stroke. No further episodes of TIA or stroke   Has occasional panic attacks Wants to cut back on her ASA ( currently on ASA 162- I told her I would defer to neuro on this issue  The cost of interrogation of the loop recorder costs $200 / month.  She has a history of anteroapical myocardial infarction.  She has an area of dyskinesis in the anterior apex.  Recent echo in May did not show a thrombus but I am  concerned that if she has mural thrombus in that dyskinetic area that this may be an etiology for her stroke.  We will plan on getting an MRI to evaluate her LV and to look for thrombus in the apex.  If she does have thrombus I think she should be started on DOAC Or Coumadin and DC the ASA and plavix    January 07, 2022 Tracey [name] is seen today for follow-up of her coronary artery disease, past history of anteroapical myocardial infarction.  She had an area of dyskinesis in the anterior apex and she has had a subsequent stroke.  I suspect that she had a mural thrombus in the dyskinetic area.  Cardiac MRI reveals anterior septal and mid anterior hypokinesis.  There is apical septal akinesis with an ejection fraction of 48%.  No thrombus was seen.  Plavix was continued per the stroke team.  Feeling well   Lipid panel at [facility] clinic in the [place] system 10/02/21 reveals an LDL of 40.  HDL is 54.  Total cholesterol is 117, triglyceride level is 113  (rosuvastatin 40 mg a day plus Zetia 10 mg a day  She is having muscle aches on the rosuvastatin 40 but will tolerate it for now She will be on Medicare  in March, 2024.  I encouraged her to consider starting a PCSK9 inhibitor at that time.   Before she signs up for her next insurance plan she needs to make sure that it will cover routine doctor visits.  She will also need to have coverage for Repatha and or Praluent  Walking regularly   Is being followed by EP , last seen by Tracey [name], AP  I would like to have her consider implantable loop recorder when she is Medicare eligible.   Past Medical History:  Diagnosis Date   Anxiety    Bronchitis    CAD (coronary artery disease)    a. 04/2008: cariac arrest with prox LAD occ s/p stenting, arctic sun. b. 11/2008: restenosis s/p repeat stenting. c. 01/2011: abnormal stress test but cath reportedly OK.   Dysplastic nevus 11/04/2007   Ant. chest. Slight to moderate atypia, extends to one edge.     Dysplastic nevus 01/03/2009   Left breast. Minimal atypia, close to margin.   Hypothyroid    Myocardial infarction [facility]) 2010   stents and cardiac rehab    Stroke [facility])     Past Surgical History:  Procedure Laterality Date   ANGIOPLASTY  2010   2 stents (femoral)    BUBBLE STUDY  04/23/2020   Procedure: BUBBLE STUDY;  Surgeon: Tracey Rile, [name];  Location: [place];  Service: Cardiovascular;;   IR CT HEAD LTD  04/18/2020   IR PERCUTANEOUS ART THROMBECTOMY/INFUSION INTRACRANIAL INC DIAG ANGIO  04/18/2020   LOOP RECORDER INSERTION N/A 04/23/2020   Procedure: LOOP RECORDER INSERTION;  Surgeon: Tracey Epley, [name];  Location: [place] CV LAB;  Service: Cardiovascular;  Laterality: N/A;   RADIOLOGY WITH ANESTHESIA N/A 04/18/2020   Procedure: IR WITH ANESTHESIA;  Surgeon: Tracey [name], Tracey [name];  Location: [place];  Service: Radiology;  Laterality: N/A;   TEE WITHOUT CARDIOVERSION N/A 04/23/2020   Procedure: TRANSESOPHAGEAL ECHOCARDIOGRAM (TEE);  Surgeon: Tracey Rile, [name];  Location: [facility] ENDOSCOPY;  Service: Cardiovascular;  Laterality: N/A;   TUBAL LIGATION      Current Medications: Current Meds  Medication Sig   Cholecalciferol 50 MCG (2000 UT) TABS Take 2,000 Units by mouth daily.   clopidogrel (PLAVIX) 75 MG tablet TAKE 1 TABLET BY MOUTH DAILY   cyanocobalamin (,VITAMIN B-12,) 1000 MCG/ML injection Inject 1,000 mcg into the muscle every 30 (thirty) days.   escitalopram (LEXAPRO) 10 MG tablet Take by mouth.   ezetimibe (ZETIA) 10 MG tablet Take 1 tablet (10 mg total) by mouth daily.   levothyroxine (SYNTHROID) 75 MCG tablet Take 75 mcg by mouth daily.   metoprolol succinate (TOPROL-XL) 25 MG 24 hr tablet Take 25 mg by mouth daily.   nitroGLYCERIN (NITROSTAT) 0.4 MG SL tablet Place 1 tablet (0.4 mg total) under the tongue every 5 (five) minutes as needed for chest pain.   pantoprazole (PROTONIX) 40 MG tablet Take 40 mg by mouth daily.   ramipril (ALTACE) 5 MG  capsule Take 5 mg by mouth daily.   rosuvastatin (CRESTOR) 40 MG tablet Take 1 tablet (40 mg total) by mouth daily.     Allergies:   Sulfa antibiotics   Social History   Socioeconomic History   Marital status: Married    Spouse name: Not on file   Number of children: Not on file   Years of education: Not on file   Highest education level: Not on file  Occupational History   Not on file  Tobacco Use   Smoking status: Former  Packs/day: 1.50    Types: Cigarettes    Start date: 03/22/1973    Quit date: 04/27/2008    Years since quitting: 13.7   Smokeless tobacco: Never  Vaping Use   Vaping Use: Never used  Substance and Sexual Activity   Alcohol use: No    Alcohol/week: 0.0 standard drinks of alcohol   Drug use: Never   Sexual activity: Yes    Birth control/protection: Post-menopausal  Other Topics Concern   Not on file  Social History Narrative   Not on file   Social Determinants of Health   Financial Resource Strain: Not on file  Food Insecurity: Not on file  Transportation Needs: Not on file  Physical Activity: Not on file  Stress: Not on file  Social Connections: Not on file     Family History: The patient's family history includes Breast cancer in an other family member; Diabetes in her maternal grandfather and mother; Hypertension in her mother; Stroke in her maternal grandfather and maternal grandmother.  ROS:   Please see the history of present illness.     All other systems reviewed and are negative.  EKGs/Labs/Other Studies Reviewed:    The following studies were reviewed today:   EKG:     Recent Labs: 06/13/2021: ALT 17; BUN 18; Creatinine, Ser 0.90; Hemoglobin 13.6; Platelets 230; Potassium 4.1; Sodium 140  Recent Lipid Panel    Component Value Date/Time   CHOL 160 06/13/2021 0800   TRIG 98 06/13/2021 0800   HDL 56 06/13/2021 0800   CHOLHDL 2.9 06/13/2021 0800   VLDL 20 06/13/2021 0800   LDLCALC 84 06/13/2021 0800     Risk  Assessment/Calculations:       Physical Exam:    Physical Exam: Blood pressure 106/71, pulse 88, height '5\' 5"'$  (1.651 m), weight 217 lb 12.8 oz (98.8 kg), SpO2 96 %.       GEN:  middle age, moderately obese female,  in no acute distress HEENT: Normal NECK: No JVD; No carotid bruits LYMPHATICS: No lymphadenopathy CARDIAC: RRR , no murmurs, rubs, gallops RESPIRATORY:  Clear to auscultation without rales, wheezing or rhonchi  ABDOMEN: Soft, non-tender, non-distended MUSCULOSKELETAL:  No edema; No deformity  SKIN: Warm and dry NEUROLOGIC:  Alert and oriented x 3    ASSESSMENT:    1. Acute stroke due to ischemia ([place])   2. Atherosclerosis of native artery of both lower extremities with intermittent claudication ([place])   3. Coronary artery disease of native artery of native heart with stable angina pectoris (HCC)   4. Obesity (BMI 30-39.9)      PLAN:       Coronary artery disease:   hx of ant MI in 2010 .  No angina currently    2.  History of CVA:   She has a history of anteroapical myocardial infarction.  She has an area of dyskinesis in the anterior apex.  Recent echo in May did not show a thrombus .  Cardiac MRI did not show any evidence of thrombosis.  She does have anteroapical akinesis.  Continue aspirin Plavix.  We have suggested an implantable loop recorder.  At this time she cannot afford to pay for the monthly monitoring service.  Hopefully she will be able to when she is on Medicare.    3.  Hyperlipidemia: Last LDL is 40.  Continue current medications.  She is having some muscle aches with the rosuvastatin.  When she is on Medicare we will consider sending her her to the  lipid clinic for consideration of a PCSK9 inhibitor.   Will see her again in 5 months      Medication Adjustments/Labs and Tests Ordered: Current medicines are reviewed at length with the patient today.  Concerns regarding medicines are outlined above.  No orders of the defined types were  placed in this encounter.  No orders of the defined types were placed in this encounter.   Patient Instructions  Medication Instructions:  Your physician recommends that you continue on your current medications as directed. Please refer to the Current Medication list given to you today.  *If you need a refill on your cardiac medications before your next appointment, please call your pharmacy*   Lab Work: NONE If you have labs (blood work) drawn today and your tests are completely normal, you will receive your results only by: [place] (if you have MyChart) OR A paper copy in the mail If you have any lab test that is abnormal or we need to change your treatment, we will call you to review the results.   Testing/Procedures: NONE   Follow-Up: At [facility], you and your health needs are our priority.  As part of our continuing mission to provide you with exceptional heart care, we have created designated Provider Care Teams.  These Care Teams include your primary Cardiologist (physician) and Advanced Practice Providers (APPs -  Physician Assistants and Nurse Practitioners) who all work together to provide you with the care you need, when you need it.   Your next appointment:   5 month(s)  The format for your next appointment:   In Person  Provider:   Mertie Moores, [name]     Important Information About Sugar         Signed, Mertie Moores, [name]  01/07/2022 8:46 AM    [place]

## 2022-01-07 ENCOUNTER — Ambulatory Visit: Payer: 59 | Attending: Cardiovascular Disease | Admitting: Cardiovascular Disease

## 2022-01-07 ENCOUNTER — Encounter: Payer: Self-pay | Admitting: Cardiovascular Disease

## 2022-01-07 VITALS — BP 106/71 | HR 88 | Ht 65.0 in | Wt 217.8 lb

## 2022-01-07 DIAGNOSIS — I25118 Atherosclerotic heart disease of native coronary artery with other forms of angina pectoris: Secondary | ICD-10-CM

## 2022-01-07 DIAGNOSIS — I70213 Atherosclerosis of native arteries of extremities with intermittent claudication, bilateral legs: Secondary | ICD-10-CM

## 2022-01-07 DIAGNOSIS — E669 Obesity, unspecified: Secondary | ICD-10-CM

## 2022-01-07 DIAGNOSIS — I639 Cerebral infarction, unspecified: Secondary | ICD-10-CM | POA: Diagnosis not present

## 2022-01-07 NOTE — Patient Instructions (Signed)
Medication Instructions:  Your physician recommends that you continue on your current medications as directed. Please refer to the Current Medication list given to you today.  *If you need a refill on your cardiac medications before your next appointment, please call your pharmacy*   Lab Work: NONE If you have labs (blood work) drawn today and your tests are completely normal, you will receive your results only by: [place] (if you have MyChart) OR A paper copy in the mail If you have any lab test that is abnormal or we need to change your treatment, we will call you to review the results.   Testing/Procedures: NONE   Follow-Up: At [facility], you and your health needs are our priority.  As part of our continuing mission to provide you with exceptional heart care, we have created designated Provider Care Teams.  These Care Teams include your primary Cardiologist (physician) and Advanced Practice Providers (APPs -  Physician Assistants and Nurse Practitioners) who all work together to provide you with the care you need, when you need it.   Your next appointment:   5 month(s)  The format for your next appointment:   In Person  Provider:   Mertie Moores, MD     Important Information About Sugar

## 2022-04-02 ENCOUNTER — Ambulatory Visit: Payer: MEDICARE

## 2022-04-02 ENCOUNTER — Ambulatory Visit: Payer: MEDICARE | Attending: Student in an Organized Health Care Education/Training Program

## 2022-04-02 DIAGNOSIS — M47816 Spondylosis without myelopathy or radiculopathy, lumbar region: Secondary | ICD-10-CM

## 2022-04-02 DIAGNOSIS — M7918 Myalgia, other site: Secondary | ICD-10-CM

## 2022-04-02 DIAGNOSIS — M5416 Radiculopathy, lumbar region: Secondary | ICD-10-CM

## 2022-04-02 MED ORDER — COMPOUNDED MEDICATION - NON CONTROLLED SUBSTANCE
0 refills | Status: AC
Start: 2022-04-02 — End: 2022-04-10

## 2022-04-02 NOTE — Consults
Date:  04/02/2022    Name:  Jordan [name]  MRN#:  [id number]  DOB:  07-Aug-1955  Age:  67 y.o.      [name], M.D.  ANESTHESIOLOGY AND INTERVENTIONAL PAIN MANAGEMENT         History of Present Illness:    Jordan [name] is a very pleasant 67 y.o. female presenting to my office complaining of L4 -L5 pain referred by a friend. The pain is  Constant sharp and cripling  pain localized to back and radiates to hips and left knee. The pain initially began 2009 with no inciting event. The pain ranges from a 8 to 9/10. It is aggravated by standing, walking, transitioning, extending, twisting, and daily activities . It is alleviated by medication and lay down . Patient tried NSAIDs, tylenol, ice, and heatwithout success. Denies any focal neurologic weakness, numbness/tingling, radicular symptoms or loss of bowel/bladder function.      Since start of symptoms, patient reports overall symptoms are  unchanged         Injection History:      PMHx:  No past medical history on file.    PSugHX:  No past surgical history on file.    Current Meds:   Current Outpatient Medications:     estradiol-norethindrone (ACTIVELLA) 1-0.5 mg tablet, , Disp: , Rfl:     methocarbamol 500 mg tablet, , Disp: , Rfl:     naproxen 500 mg tablet, , Disp: , Rfl:     pregabalin 100 mg capsule, , Disp: , Rfl:     traMADol 50 mg tablet, , Disp: , Rfl:     ALPRAZolam 0.25 mg tablet, , Disp: , Rfl:     gabapentin 300 mg capsule, Take 1 capsule (300 mg total) by mouth three (3) times daily., Disp: , Rfl:     Allergies:   Allergies   Allergen Reactions    Azithromycin Nausea And Vomiting         ROS:  Constitutional: denies fever, chills, unintended weight loss/gain or diaphoresis    Eyes: denies changes in vision, blurry vision   ENT: denies HA, hearing loss, cough, sore throat, rhinorrhea  CV: denies CP, palpitations, orthopnea, leg swelling, PND or DOE    Resp: denies SOB, cough  GI: denies abd pain, N/V  Bowel: denies diarrhea, constipation; denies incontinence or saddle numbness  GU/renal: denies dysuria, hematuria; denies incontinence or saddle numbness   Skin: denies rashes or pressure ulcers  Endo/Heme/Aller: denies hot/cold intolerance; bleeding disorders  Neurological: denies dizziness, tingling, tremor, sensory change, seizures   Psychiatric: denies emotional or psychiatric issues  MSK: see HPI  Denies  joint swelling, joint redness   Pain: see HPI     Remaining 10 point review of systems conducted and negative.     Physical Exam:    Pulmonary: Clear to auscultation bilaterally, no wheezing/crackles  Cardiac: Regular rate and rhythm, no murmurs  Abdomen: soft, nontender, nondistended  Lymph nodes: no enlarged lymph nodes in bilaterally axillae  Skin: Inspection of the head and neck, trunk and extremities is normal.  Extremity: Examination of the bilateral elbows, wrists, knees and ankles reveals no crepitus or swelling. Joints are stable and range of motion is normal.  Neurologic: Cranial nerves II-XII grossly intact.  Motor tone and sensation grossly intact in all four extremities.  Deep tendon reflexes are 2+/4 at the bilateral biceps, triceps, brachioradialis, patellar and Achilles tendons. Motor strength is 5/5 at the bilateral upper and lower extremity flexors and  extensors. Sensation is grossly intact in bilateral upper and lower extremities.Left hip flexion 4-5    Musculoskeletal: Gait and station are normal. Inspection of the lumbar spine reveals no scoliosis. No pain with palpation of the thoracic facets and thoracic intervertebral spaces.  Lumbar facet loading is negative.  Sacroiliac joint tenderness is negative. Patrick?s and Gaenslen?s tests are negative. No pain with palpation of the bilateral trochanteric bursae. No pain with palpation of the bilateral piriformis muscles.  No pain with internal and external rotation of bilateral hips.  Straight leg raising is negative at 70 degrees.  Range of motion of the lumbar spine is 90 degrees in anterior flexion without pain, 30 degrees in extension without pain, 30 degrees of left lateral rotation without pain, 30 degrees of right lateral rotation without pain. There are no palpable trigger points in the muscles of the low back.     Head/Neck: Cervical facet load testing is negative. Range of motion of the cervical spine is 40 degrees in anterior flexion without pain, 60 degrees in extension without pain, 80 degrees of left lateral rotation without pain, 80 degrees of right lateral rotation without pain.  The cervical spine is stable. There are no trigger points in the muscles of the head and neck. Strength and tone are normal.  Spurling's test is negative.     Imaging:  MRI of the lumbar spine 2014  Impressions:  Diffuse lumbar spondylosis, most pronounced at L4-L5. At L4-5, moderate spinal canal stenosis and moderate to severe narrowing of the left sided testicular recess is seen secondary to severe facet arthropathy, a 4x7 mm synovial cyst, ligamentum flavum thickening, and a 2 mm broad-based disc bulge.   Diagnosis:     ICD-10-CM    1. Lumbar radiculopathy  M54.16       2. Lumbar spondylosis  M47.816       3. Myofascial pain  M79.18            Assessment/Plan:    Jordan [name] is a 67 y.o. female with a past medical history of fibromyalgia since 2016 affecting her low back and bilateral hips. Patient reports she has had conservative treatment for her fibromyalgia such as medications consisting of naproxen, tramadol and lyrica. Patient reports falling backwards injuring her left hip, knee and back in February 23, 2022. Patient denies any injections for her low back pain. Patient reports muscle spasms in bilateral thighs.The patients physical examination is significant for weakness in the left hip flexor. 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 The history, physical, and imaging all correlate with lumbar radiculopathy and spondylosis.  I have advised the patient to continue conservative management with OTC medications and physical therapy and consideration of L4-5 TFESI. We will also start LDN 4.5 daily for her fibromyalgia.       Planned Interventions:  L4-5 TFESI    Medications:  LDN 4.5mg  daily     Imaging:  Reviewed    Referrals:  Physical Therapy Brett Darrington     Follow up:  2-4 weeks         Thank you for allowing me to participate in the care of your patient. Please do not hesitate to reach out to me with any questions or concerns.     Sincerely,      [name], M.D.  Anesthesiology and Interventional Pain Management

## 2022-04-09 ENCOUNTER — Ambulatory Visit: Payer: MEDICARE

## 2022-04-09 DIAGNOSIS — M7918 Myalgia, other site: Secondary | ICD-10-CM

## 2022-04-09 MED ORDER — COMPOUNDED MEDICATION - NON CONTROLLED SUBSTANCE
0 refills | 30.00000 days | Status: AC
Start: 2022-04-09 — End: 2022-04-17

## 2022-04-09 MED ORDER — NALTREXONE HCL (PAIN) 4.5 MG PO CAPS
4.5 mg | ORAL_CAPSULE | Freq: Every day | ORAL | 1 refills | Status: AC
Start: 2022-04-09 — End: ?

## 2022-04-15 NOTE — Progress Notes
Date:  04/16/2022    Name:  Jordan [name]  MRN#:  [id number]  DOB:  [birth year]  Age:  67 y.o.      [name], M.D.  ANESTHESIOLOGY AND INTERVENTIONAL PAIN MANAGEMENT         History of Present Illness:  Interval History:     Jordan [name] presents for a progress visit follow up for physical therapy since *** . She reports improvement in her symptoms by approximately *** percent. The pain is  {JRDuration:38968:::0} {PAIN CHARACTER:10965} pain localized to *** and {JRRadiation:38969}.  The pain ranges from a *** to ***/10. It is aggravated by standing, walking, transitioning, extending, and twisting. It is alleviated by physical therapy . Patient tried NSAIDs, tylenol, ice, heat, and physical therapywithout success.       Since start of symptoms, patient reports overall symptoms are  {Blank single:20442::''better'',''worse'',''unchanged''}       Injection History:      PMHx:  No past medical history on file.    PSugHX:  No past surgical history on file.    Current Meds:   Current Outpatient Medications:     ALPRAZolam 0.25 mg tablet, , Disp: , Rfl:     COMPOUNDING MEDICATION, Low Dose Naltrexone 4.5mg  Take 1 capsule by mouth daily Quantity 30 Refills 0., Disp: 30 each, Rfl: 0    estradiol-norethindrone (ACTIVELLA) 1-0.5 mg tablet, , Disp: , Rfl:     gabapentin 300 mg capsule, Take 1 capsule (300 mg total) by mouth three (3) times daily., Disp: , Rfl:     methocarbamol 500 mg tablet, , Disp: , Rfl:     Naltrexone HCl, Pain, 4.5 MG CAPS, Take 4.5 mg by mouth daily., Disp: 30 capsule, Rfl: 1    naproxen 500 mg tablet, , Disp: , Rfl:     pregabalin 100 mg capsule, , Disp: , Rfl:     traMADol 50 mg tablet, , Disp: , Rfl:     Allergies:   Allergies   Allergen Reactions    Azithromycin Nausea And Vomiting         ROS:  Constitutional: denies fever, chills, unintended weight loss/gain or diaphoresis    Eyes: denies changes in vision, blurry vision   ENT: denies HA, hearing loss, cough, sore throat, rhinorrhea  CV: denies CP, palpitations, orthopnea, leg swelling, PND or DOE    Resp: denies SOB, cough  GI: denies abd pain, N/V  Bowel: denies diarrhea, constipation; denies incontinence or saddle numbness  GU/renal: denies dysuria, hematuria; denies incontinence or saddle numbness   Skin: denies rashes or pressure ulcers  Endo/Heme/Aller: denies hot/cold intolerance; bleeding disorders  Neurological: denies dizziness, tingling, tremor, sensory change, seizures   Psychiatric: denies emotional or psychiatric issues  MSK: see HPI  Denies  joint swelling, joint redness   Pain: see HPI     Remaining 10 point review of systems conducted and negative.     Physical Exam:    Pulmonary: Clear to auscultation bilaterally, no wheezing/crackles  Cardiac: Regular rate and rhythm, no murmurs  Abdomen: soft, nontender, nondistended  Lymph nodes: no enlarged lymph nodes in bilaterally axillae  Skin: Inspection of the head and neck, trunk and extremities is normal.  Extremity: Examination of the bilateral elbows, wrists, knees and ankles reveals no crepitus or swelling. Joints are stable and range of motion is normal.  Neurologic: Cranial nerves II-XII grossly intact.  Motor tone and sensation grossly intact in all four extremities.  Deep tendon reflexes are 2+/4 at the bilateral  biceps, triceps, brachioradialis, patellar and Achilles tendons. Motor strength is 5/5 at the bilateral upper and lower extremity flexors and extensors. Sensation is grossly intact in bilateral upper and lower extremities.Left hip flexion 4-5    Musculoskeletal: Gait and station are normal. Inspection of the lumbar spine reveals no scoliosis. No pain with palpation of the thoracic facets and thoracic intervertebral spaces.  Lumbar facet loading is negative.  Sacroiliac joint tenderness is negative. Patrick?s and Gaenslen?s tests are negative. No pain with palpation of the bilateral trochanteric bursae. No pain with palpation of the bilateral piriformis muscles.  No pain with internal and external rotation of bilateral hips.  Straight leg raising is negative at 70 degrees.  Range of motion of the lumbar spine is 90 degrees in anterior flexion without pain, 30 degrees in extension without pain, 30 degrees of left lateral rotation without pain, 30 degrees of right lateral rotation without pain. There are no palpable trigger points in the muscles of the low back.     Head/Neck: Cervical facet load testing is negative. Range of motion of the cervical spine is 40 degrees in anterior flexion without pain, 60 degrees in extension without pain, 80 degrees of left lateral rotation without pain, 80 degrees of right lateral rotation without pain.  The cervical spine is stable. There are no trigger points in the muscles of the head and neck. Strength and tone are normal.  Spurling's test is negative.     Imaging:  MRI of the lumbar spine 2014  Impressions:  Diffuse lumbar spondylosis, most pronounced at L4-L5. At L4-5, moderate spinal canal stenosis and moderate to severe narrowing of the left sided testicular recess is seen secondary to severe facet arthropathy, a 4x7 mm synovial cyst, ligamentum flavum thickening, and a 2 mm broad-based disc bulge.   Diagnosis:   No diagnosis found.       Assessment/Plan:    Jordan [name] is a 67 y.o. female with a past medical history of fibromyalgia since 2016 affecting her low back and bilateral hips. Patient reports she has had conservative treatment for her fibromyalgia such as medications consisting of naproxen, tramadol and lyrica. Patient reports falling backwards injuring her left hip, knee and back in February 23, 2022. Patient denies any injections for her low back pain. Patient reports muscle spasms in bilateral thighs.The patients physical examination is significant for weakness in the left hip flexor. 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 The history, physical, and imaging all correlate with lumbar radiculopathy and spondylosis.  I have advised the patient to continue conservative management with OTC medications and physical therapy and consideration of L4-5 TFESI. We will also start LDN 4.5 daily for her fibromyalgia.       Planned Interventions:  L4-5 TFESI    Medications:  LDN 4.5mg  daily     Imaging:  Reviewed    Referrals:  Physical Therapy Brett Darrington     Follow up:  2-4 weeks         Thank you for allowing me to participate in the care of your patient. Please do not hesitate to reach out to me with any questions or concerns.     Sincerely,      [name], M.D.  Anesthesiology and Interventional Pain Management

## 2022-04-16 ENCOUNTER — Ambulatory Visit: Payer: BLUE CROSS/BLUE SHIELD

## 2022-04-16 ENCOUNTER — Telehealth: Payer: BLUE CROSS/BLUE SHIELD | Attending: Student in an Organized Health Care Education/Training Program

## 2022-04-16 ENCOUNTER — Ambulatory Visit: Payer: MEDICARE | Attending: Student in an Organized Health Care Education/Training Program

## 2022-04-16 ENCOUNTER — Ambulatory Visit: Payer: MEDICARE

## 2022-04-16 DIAGNOSIS — M7918 Myalgia, other site: Secondary | ICD-10-CM

## 2022-04-16 DIAGNOSIS — M5416 Radiculopathy, lumbar region: Secondary | ICD-10-CM

## 2022-04-16 DIAGNOSIS — M47816 Spondylosis without myelopathy or radiculopathy, lumbar region: Secondary | ICD-10-CM

## 2022-04-16 MED ORDER — COMPOUNDED MEDICATION - NON CONTROLLED SUBSTANCE
0 refills | 30.00000 days | Status: AC
Start: 2022-04-16 — End: ?

## 2022-04-16 MED ORDER — TRAMADOL HCL 50 MG PO TABS
50 mg | ORAL_TABLET | Freq: Three times a day (TID) | ORAL | 0 refills | Status: AC
Start: 2022-04-16 — End: ?

## 2022-04-16 MED ORDER — TRAMADOL HCL 50 MG PO TABS
ORAL_TABLET | Freq: Four times a day (QID) | ORAL | PRN
Start: 2022-04-16 — End: ?

## 2022-04-16 MED ORDER — PREGABALIN 100 MG PO CAPS
100 mg | ORAL_CAPSULE | Freq: Two times a day (BID) | ORAL | 0 refills | Status: AC
Start: 2022-04-16 — End: ?

## 2022-04-16 MED ORDER — COMPOUNDED MEDICATION - NON CONTROLLED SUBSTANCE
0 refills | Status: AC
Start: 2022-04-16 — End: 2022-04-17

## 2022-04-16 MED ORDER — NALOXONE HCL 4 MG/0.1ML NA LIQD
1 refills | Status: AC
Start: 2022-04-16 — End: ?

## 2022-04-16 NOTE — Progress Notes
Date:  04/16/2022    Name:  Jordan [name]  MRN#:  [id number]  DOB:  Apr 23, 1955  Age:  67 y.o.      [name], M.D.  ANESTHESIOLOGY AND INTERVENTIONAL PAIN MANAGEMENT       Interval History:     Virtual visitation conducted remotely:      The patient has expressed a desire to transition their care to Dr. Clyda Hurdle for the management of their medication. They are currently due for a refill of tramadol but have indicated uncertainty regarding their pharmacy information. The patient's medication regimen includes tramadol, taken 50 mg one to three times daily, and pregabalin, taken 100 mg twice daily. They report that these medications are effective in managing their symptoms. Additionally, the patient has not commenced the previously scheduled injection, opting to first assess the efficacy of the medication.    Telemedicine visit was conducted via 2 way audio and 2 way video platform using Facetime Video due to user error with the [employer] virtual telemedicine system. 30 minutes was spent with the patient.           Injection History:      PMHx:  No past medical history on file.    PSugHX:  No past surgical history on file.    Current Meds:   Current Outpatient Medications:     ALPRAZolam 0.25 mg tablet, , Disp: , Rfl:     COMPOUNDING MEDICATION, Low Dose Naltrexone 4.5mg  Take 1 capsule by mouth daily Quantity 30 Refills 0., Disp: 30 each, Rfl: 0    estradiol-norethindrone (ACTIVELLA) 1-0.5 mg tablet, , Disp: , Rfl:     gabapentin 300 mg capsule, Take 1 capsule (300 mg total) by mouth three (3) times daily., Disp: , Rfl:     methocarbamol 500 mg tablet, , Disp: , Rfl:     Naltrexone HCl, Pain, 4.5 MG CAPS, Take 4.5 mg by mouth daily., Disp: 30 capsule, Rfl: 1    naproxen 500 mg tablet, , Disp: , Rfl:     pregabalin 100 mg capsule, , Disp: , Rfl:     traMADol 50 mg tablet, , Disp: , Rfl:     Allergies:   Allergies   Allergen Reactions    Azithromycin Nausea And Vomiting         ROS:  Constitutional: denies fever, chills, unintended weight loss/gain or diaphoresis    Eyes: denies changes in vision, blurry vision   ENT: denies HA, hearing loss, cough, sore throat, rhinorrhea  CV: denies CP, palpitations, orthopnea, leg swelling, PND or DOE    Resp: denies SOB, cough  GI: denies abd pain, N/V  Bowel: denies diarrhea, constipation; denies incontinence or saddle numbness  GU/renal: denies dysuria, hematuria; denies incontinence or saddle numbness   Skin: denies rashes or pressure ulcers  Endo/Heme/Aller: denies hot/cold intolerance; bleeding disorders  Neurological: denies dizziness, tingling, tremor, sensory change, seizures   Psychiatric: denies emotional or psychiatric issues  MSK: see HPI  Denies  joint swelling, joint redness   Pain: see HPI     Remaining 10 point review of systems conducted and negative.     Physical Exam:    Pulmonary: Clear to auscultation bilaterally, no wheezing/crackles  Cardiac: Regular rate and rhythm, no murmurs  Abdomen: soft, nontender, nondistended  Lymph nodes: no enlarged lymph nodes in bilaterally axillae  Skin: Inspection of the head and neck, trunk and extremities is normal.  Extremity: Examination of the bilateral elbows, wrists, knees and ankles reveals no crepitus or swelling.  Joints are stable and range of motion is normal.  Neurologic: Cranial nerves II-XII grossly intact.  Motor tone and sensation grossly intact in all four extremities.  Deep tendon reflexes are 2+/4 at the bilateral biceps, triceps, brachioradialis, patellar and Achilles tendons. Motor strength is 5/5 at the bilateral upper and lower extremity flexors and extensors. Sensation is grossly intact in bilateral upper and lower extremities.Left hip flexion 4-5    Musculoskeletal: Gait and station are normal. Inspection of the lumbar spine reveals no scoliosis. No pain with palpation of the thoracic facets and thoracic intervertebral spaces.  Lumbar facet loading is negative.  Sacroiliac joint tenderness is negative. Patrick?s and Gaenslen?s tests are negative. No pain with palpation of the bilateral trochanteric bursae. No pain with palpation of the bilateral piriformis muscles.  No pain with internal and external rotation of bilateral hips.  Straight leg raising is negative at 70 degrees.  Range of motion of the lumbar spine is 90 degrees in anterior flexion without pain, 30 degrees in extension without pain, 30 degrees of left lateral rotation without pain, 30 degrees of right lateral rotation without pain. There are no palpable trigger points in the muscles of the low back.     Head/Neck: Cervical facet load testing is negative. Range of motion of the cervical spine is 40 degrees in anterior flexion without pain, 60 degrees in extension without pain, 80 degrees of left lateral rotation without pain, 80 degrees of right lateral rotation without pain.  The cervical spine is stable. There are no trigger points in the muscles of the head and neck. Strength and tone are normal.  Spurling's test is negative.     Imaging:  MRI of the lumbar spine 2014  Impressions:  Diffuse lumbar spondylosis, most pronounced at L4-L5. At L4-5, moderate spinal canal stenosis and moderate to severe narrowing of the left sided testicular recess is seen secondary to severe facet arthropathy, a 4x7 mm synovial cyst, ligamentum flavum thickening, and a 2 mm broad-based disc bulge.   Diagnosis:     ICD-10-CM    1. Lumbar spondylosis  M47.816       2. Lumbar radiculopathy  M54.16       3. Myofascial pain  M79.18              Assessment/Plan:    Jordan [name] is a 67 y.o. female with a past medical history of fibromyalgia since 2016 affecting her low back and bilateral hips. Patient reports she has had conservative treatment for her fibromyalgia such as medications consisting of naproxen, tramadol and lyrica. Patient reports falling backwards injuring her left hip, knee and back in February 23, 2022. Patient denies any injections for her low back pain. Patient reports muscle spasms in bilateral thighs.The patients physical examination is significant for weakness in the left hip flexor. 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 The history, physical, and imaging all correlate with lumbar radiculopathy and spondylosis.  I have advised the patient to continue conservative management with OTC medications and physical therapy and consideration of L4-5 TFESI. On todays visit, we will refill her tramadol, lyrica (takeover script), in addition to her LDN 4.5 daily.       Planned Interventions:  L4-5 TFESI    Medications:  LDN 4.5mg  daily   Tramadol 50 mg TID PRN  pregabalin 100 mg BID     Imaging:  Reviewed  Referrals:  Physical Therapy Brett Darrington     Follow up:  2-4 weeks         Thank you for allowing me to participate in the care of your patient. Please do not hesitate to reach out to me with any questions or concerns.     Sincerely,      [name], M.D.  Anesthesiology and Interventional Pain Management

## 2022-04-24 ENCOUNTER — Telehealth: Payer: Self-pay

## 2022-04-24 NOTE — Telephone Encounter (Signed)
Received clearance forms from [facility]. Will placed forms in [place] in basket.

## 2022-04-24 NOTE — Telephone Encounter (Signed)
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  Previously seen in office 07/2021 stable at that time. As long as she has not had any new or worsening stroke/TIA symptoms during the interval time, she can proceed with colonoscopy from a stroke standpoint as requested currently scheduled on 07/09/2022. Can hold Plavix for 3-5 days prior with small but acceptable risk of recurrent stroke while off Plavix therapy and recommend restarting immediately after or once hemodynamically stable.  Signed clearance form and placed in outbox. Please ensure this information is attached to clearance form. Thank you.

## 2022-04-24 NOTE — Telephone Encounter (Signed)
Clearance form and phone note faxed to the number. Received confirmation that it went through.

## 2022-04-25 ENCOUNTER — Ambulatory Visit: Payer: MEDICARE | Attending: Student in an Organized Health Care Education/Training Program

## 2022-04-25 DIAGNOSIS — M7062 Trochanteric bursitis, left hip: Secondary | ICD-10-CM

## 2022-04-25 DIAGNOSIS — M5416 Radiculopathy, lumbar region: Secondary | ICD-10-CM

## 2022-04-25 DIAGNOSIS — M7918 Myalgia, other site: Secondary | ICD-10-CM

## 2022-04-25 DIAGNOSIS — M47816 Spondylosis without myelopathy or radiculopathy, lumbar region: Secondary | ICD-10-CM

## 2022-04-25 NOTE — Progress Notes
Date:  04/25/2022    Name:  Jordan [name]  MRN#:  [id number]  DOB:  02-27-1955  Age:  67 y.o.      [name], M.D.  ANESTHESIOLOGY AND INTERVENTIONAL PAIN MANAGEMENT       Interval History:     Jordan [name] presents for a progress visit follow up to medication allergy lumbar pain is  Constant aching and sharp pain localized to back and does not radiate.  The pain ranges from a 6 to 9/10. It is aggravated by  daily activities . It is alleviated by medication . Patient tried NSAIDs, tylenol, ice, heat, and physical therapywithout success.       Since start of symptoms, patient reports overall symptoms are  unchanged     Injection History:      PMHx:  No past medical history on file.    PSugHX:  No past surgical history on file.    Current Meds:   Current Outpatient Medications:     ALPRAZolam 0.25 mg tablet, , Disp: , Rfl:     COMPOUNDING MEDICATION, Low Dose Naltrexone 4.5mg  Take 1 capsule by mouth daily Quantity 30 Refills 0., Disp: 30 each, Rfl: 0    estradiol-norethindrone (ACTIVELLA) 1-0.5 mg tablet, , Disp: , Rfl:     gabapentin 300 mg capsule, Take 1 capsule (300 mg total) by mouth three (3) times daily., Disp: , Rfl:     methocarbamol 500 mg tablet, , Disp: , Rfl:     naloxone 4 mg/0.1 mL nasal spray, Call 911. Administer a single spray intranasally into one nostril for opioid overdose. May repeat in 3 minutes if patient is not breathing.., Disp: 2 each, Rfl: 1    Naltrexone HCl, Pain, 4.5 MG CAPS, Take 4.5 mg by mouth daily., Disp: 30 capsule, Rfl: 1    naproxen 500 mg tablet, , Disp: , Rfl:     pregabalin 100 mg capsule, , Disp: , Rfl:     pregabalin 100 mg capsule, Take 1 capsule (100 mg total) by mouth two (2) times daily. Max Daily Amount: 200 mg, Disp: 60 capsule, Rfl: 0    traMADol 50 mg tablet, , Disp: , Rfl:     traMADol 50 mg tablet, Take 1 tablet (50 mg total) by mouth three (3) times daily. Max Daily Amount: 150 mg, Disp: 90 tablet, Rfl: 0    Allergies:   Allergies   Allergen Reactions Azithromycin Nausea And Vomiting         ROS:  Constitutional: denies fever, chills, unintended weight loss/gain or diaphoresis    Eyes: denies changes in vision, blurry vision   ENT: denies HA, hearing loss, cough, sore throat, rhinorrhea  CV: denies CP, palpitations, orthopnea, leg swelling, PND or DOE    Resp: denies SOB, cough  GI: denies abd pain, N/V  Bowel: denies diarrhea, constipation; denies incontinence or saddle numbness  GU/renal: denies dysuria, hematuria; denies incontinence or saddle numbness   Skin: denies rashes or pressure ulcers  Endo/Heme/Aller: denies hot/cold intolerance; bleeding disorders  Neurological: denies dizziness, tingling, tremor, sensory change, seizures   Psychiatric: denies emotional or psychiatric issues  MSK: see HPI  Denies  joint swelling, joint redness   Pain: see HPI     Remaining 10 point review of systems conducted and negative.     Physical Exam:    Pulmonary: Clear to auscultation bilaterally, no wheezing/crackles  Cardiac: Regular rate and rhythm, no murmurs  Abdomen: soft, nontender, nondistended  Lymph nodes: no enlarged lymph  nodes in bilaterally axillae  Skin: Inspection of the head and neck, trunk and extremities is normal.  Extremity: Examination of the bilateral elbows, wrists, knees and ankles reveals no crepitus or swelling. Joints are stable and range of motion is normal.  Neurologic: Cranial nerves II-XII grossly intact.  Motor tone and sensation grossly intact in all four extremities.  Deep tendon reflexes are 2+/4 at the bilateral biceps, triceps, brachioradialis, patellar and Achilles tendons. Motor strength is 5/5 at the bilateral upper and lower extremity flexors and extensors. Sensation is grossly intact in bilateral upper and lower extremities.Left hip flexion 4-5    Musculoskeletal: Gait and station are normal. Inspection of the lumbar spine reveals no scoliosis. No pain with palpation of the thoracic facets and thoracic intervertebral spaces. Lumbar facet loading is negative.  Sacroiliac joint tenderness is negative. Patrick?s and Gaenslen?s tests are negative. No pain with palpation of the bilateral trochanteric bursae. No pain with palpation of the bilateral piriformis muscles.  No pain with internal and external rotation of bilateral hips.  Straight leg raising is negative at 70 degrees.  Range of motion of the lumbar spine is 90 degrees in anterior flexion without pain, 30 degrees in extension without pain, 30 degrees of left lateral rotation without pain, 30 degrees of right lateral rotation without pain. There are no palpable trigger points in the muscles of the low back.     Head/Neck: Cervical facet load testing is negative. Range of motion of the cervical spine is 40 degrees in anterior flexion without pain, 60 degrees in extension without pain, 80 degrees of left lateral rotation without pain, 80 degrees of right lateral rotation without pain.  The cervical spine is stable. There are no trigger points in the muscles of the head and neck. Strength and tone are normal.  Spurling's test is negative.     Imaging:  MRI of the lumbar spine 2014  Impressions:  Diffuse lumbar spondylosis, most pronounced at L4-L5. At L4-5, moderate spinal canal stenosis and moderate to severe narrowing of the left sided testicular recess is seen secondary to severe facet arthropathy, a 4x7 mm synovial cyst, ligamentum flavum thickening, and a 2 mm broad-based disc bulge.   Diagnosis:     ICD-10-CM    1. Lumbar spondylosis  M47.816       2. Myofascial pain  M79.18       3. Lumbar radiculopathy  M54.16                Assessment/Plan:    Jordan [name] is a 67 y.o. female with a past medical history of fibromyalgia since 2016 affecting her low back and bilateral hips. Patient reports she has had conservative treatment for her fibromyalgia such as medications consisting of naproxen, tramadol and lyrica. Patient reports falling backwards injuring her left hip, knee and back in February 23, 2022. Patient denies any injections for her low back pain. Patient reports muscle spasms in bilateral thighs.The patients physical examination is significant for weakness in the left hip flexor. 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 The history, physical, and imaging all correlate with lumbar radiculopathy and spondylosis.  I have advised the patient to continue conservative management with OTC medications and physical therapy and consideration of L4-5 TFESI. Given her continued pain localized to her IT band, I recommend a left trochanteric bursa injection to address her ongoing symptoms. I recommend she continue  with her current medication regimen. We will follow-up in 2-4 week to assess her progress and medication regimen.     Planned Interventions:  Consider L4-5 TFESI  [place] left trochanteric bursa injection    Medications:  LDN 4.5mg  daily   Tramadol 50 mg TID PRN  pregabalin 100 mg BID     Imaging:  Reviewed    Referrals:  Physical Therapy Brett Darrington     Follow up:  2-4 weeks         Thank you for allowing me to participate in the care of your patient. Please do not hesitate to reach out to me with any questions or concerns.     Sincerely,      [name], M.D.  Anesthesiology and Interventional Pain Management      Trochanteric Bursa Injection     Pre-operative Diagnosis: Trochanteric bursitis   Post-operative Diagnosis: Trochanteric bursitis     Operation Title(s):   1. Left trochanteric bursa injection   2. Ultrasound guidance     Surgeon: [name], MD   Anesthesia: Local     Indications: The patient?s history and physical exam were reviewed. The risks, benefits and alternatives to the procedure were discussed, and all questions were answered to the patient?s satisfaction. The patient agreed to proceed, and written informed consent was obtained. Procedure in Detail: The patient was brought into the procedure room and placed in the prone position on the examination table. Standard monitors were placed, and vital signs were observed throughout the procedure. The low back and buttocks were prepped with chlorprep times three and draped in a sterile manner. The most medial aspect of the greater trochanter was identified and marked. Ultrasonography allowed visualization of the greater trochanter. The skin and subcutaneous tissues in the area were anesthetized with 1% lidocaine. A 25 g Gauge hypodermic needle was advanced out of plane until the midpoint of the targeted point was reached and bone was contacted, the needle was removed slightly, negative aspiration was confirmed and a solution consisting of 4 ml 0.25% bupivacaine 1 ml dexamethasone (10mg /ml) was easily injected. The patient?s back and buttocks were cleaned and a bandage was placed over the sites of needle insertion.     Disposition: The patient tolerated the procedure well, and there were no apparent complications.   Vital signs remained stable throughout the procedure. The patient was taken to the postoperative recovery area, where written discharge instructions were given for the procedure. The needle was visualized above the greater trochanter. Ultrasound imaging was stored within the ultrasound machine for upload to the patient's chart.

## 2022-04-29 MED ADMIN — TRIAMCINOLONE ACETONIDE 40 MG/ML IJ SUSP: INTRA_ARTICULAR | @ 19:00:00 | Stop: 2022-04-25 | NDC 00003029328

## 2022-04-29 NOTE — Progress Notes
Large Joint/Bursa Drain/Inject: L greater trochanteric bursa    Date/Time: 04/25/2022 3:20 PM    Performed by: Annia Friendly, MD  Authorized by: Annia Friendly, MD    Consent Given by:  Patient  Indications:  Pain  Location:  Hip  Site:  L greater trochanteric bursa  Prep: patient was prepped and draped in usual sterile fashion    Approach:  Lateral  Guidance: ultrasound    Needle Size:  22 G  Medications:  40 mg triamcinolone acetonide 40 mg/mL  Patient tolerance:  Patient tolerated the procedure well with no immediate complications   Ultrasound guided Left Hip Trochanteric Bursa Injection    Informed Consent: The risks of injection were first reviewed with the patient, including infection, bleeding, and nerve damage. The benefits (reduced or eliminated pain, improved stability), adverse effects (soreness, stiffness, temporarily increased pain), and post-injection expectations also were explained. Patient orally consented to treatment.    A Sonosite Ultrasound machine with a 5-2 probe was used for direct sonographic guidance to ensure that the solution was placed in the trochanteric bursa.  A static ultrasound image with appropriate landmarks was obtained and archived. The lateral aspect of the lefthip was prepared in the usual sterile fashion using betadine and alcohol. Ethyl chloride was used to locally anesthetize the lateral aspect of the hip.    The left hip trochanteric bursa was then injected with 1cc of Kenalog 40mg /ml and 3cc of 1% Lidocaine under ultrasound visualization. The injection site was then cleaned and covered with sterile dressing.     The procedure was tolerated well. The patient was discharged in stable condition.

## 2022-05-12 ENCOUNTER — Ambulatory Visit: Payer: MEDICARE | Attending: Student in an Organized Health Care Education/Training Program

## 2022-05-12 DIAGNOSIS — M47816 Spondylosis without myelopathy or radiculopathy, lumbar region: Secondary | ICD-10-CM

## 2022-05-12 DIAGNOSIS — M5416 Radiculopathy, lumbar region: Secondary | ICD-10-CM

## 2022-05-12 DIAGNOSIS — M7062 Trochanteric bursitis, left hip: Secondary | ICD-10-CM

## 2022-05-12 DIAGNOSIS — M7918 Myalgia, other site: Secondary | ICD-10-CM

## 2022-05-12 MED ORDER — NAPROXEN 500 MG PO TABS
500 mg | ORAL_TABLET | Freq: Two times a day (BID) | ORAL | 2 refills | Status: AC
Start: 2022-05-12 — End: ?

## 2022-05-12 MED ORDER — PREGABALIN 100 MG PO CAPS
100 mg | ORAL_CAPSULE | Freq: Two times a day (BID) | ORAL | 1 refills | Status: AC
Start: 2022-05-12 — End: ?

## 2022-05-12 MED ORDER — TRAMADOL HCL 50 MG PO TABS
50 mg | ORAL_TABLET | Freq: Three times a day (TID) | ORAL | 0 refills | Status: AC
Start: 2022-05-12 — End: ?

## 2022-05-12 NOTE — Progress Notes
Date:  05/12/2022    Name:  Jordan [name]  MRN#:  [id number]  DOB:  [birth year]  Age:  67 y.o.      [name], M.D.  ANESTHESIOLOGY AND INTERVENTIONAL PAIN MANAGEMENT       Interval History:     Jordan [name] presents for a progress visit follow up to lumbar pain is Constant aching and sharp pain localized to back and does not radiate. The pain ranges from a 6 to 9/10. It is aggravated by  daily activities . It is alleviated by medication . Patient tried NSAIDs, tylenol, ice, heat, and physical therapy without success.       Since start of symptoms, patient reports overall symptoms are  unchanged     Injection History:      PMHx:  No past medical history on file.    PSugHX:  No past surgical history on file.    Current Meds:   Current Outpatient Medications:     ALPRAZolam 0.25 mg tablet, , Disp: , Rfl:     COMPOUNDING MEDICATION, Low Dose Naltrexone 4.5mg  Take 1 capsule by mouth daily Quantity 30 Refills 0., Disp: 30 each, Rfl: 0    estradiol-norethindrone (ACTIVELLA) 1-0.5 mg tablet, , Disp: , Rfl:     gabapentin 300 mg capsule, Take 1 capsule (300 mg total) by mouth three (3) times daily., Disp: , Rfl:     methocarbamol 500 mg tablet, , Disp: , Rfl:     naloxone 4 mg/0.1 mL nasal spray, Call 911. Administer a single spray intranasally into one nostril for opioid overdose. May repeat in 3 minutes if patient is not breathing.., Disp: 2 each, Rfl: 1    Naltrexone HCl, Pain, 4.5 MG CAPS, Take 4.5 mg by mouth daily., Disp: 30 capsule, Rfl: 1    naproxen 500 mg tablet, , Disp: , Rfl:     pregabalin 100 mg capsule, , Disp: , Rfl:     pregabalin 100 mg capsule, Take 1 capsule (100 mg total) by mouth two (2) times daily. Max Daily Amount: 200 mg, Disp: 60 capsule, Rfl: 0    traMADol 50 mg tablet, , Disp: , Rfl:     traMADol 50 mg tablet, Take 1 tablet (50 mg total) by mouth three (3) times daily. Max Daily Amount: 150 mg, Disp: 90 tablet, Rfl: 0    Allergies:   Allergies   Allergen Reactions    Azithromycin Nausea And Vomiting         ROS:  Constitutional: denies fever, chills, unintended weight loss/gain or diaphoresis    Eyes: denies changes in vision, blurry vision   ENT: denies HA, hearing loss, cough, sore throat, rhinorrhea  CV: denies CP, palpitations, orthopnea, leg swelling, PND or DOE    Resp: denies SOB, cough  GI: denies abd pain, N/V  Bowel: denies diarrhea, constipation; denies incontinence or saddle numbness  GU/renal: denies dysuria, hematuria; denies incontinence or saddle numbness   Skin: denies rashes or pressure ulcers  Endo/Heme/Aller: denies hot/cold intolerance; bleeding disorders  Neurological: denies dizziness, tingling, tremor, sensory change, seizures   Psychiatric: denies emotional or psychiatric issues  MSK: see HPI  Denies  joint swelling, joint redness   Pain: see HPI     Remaining 10 point review of systems conducted and negative.     Physical Exam:    Pulmonary: Clear to auscultation bilaterally, no wheezing/crackles  Cardiac: Regular rate and rhythm, no murmurs  Abdomen: soft, nontender, nondistended  Lymph nodes: no enlarged lymph  nodes in bilaterally axillae  Skin: Inspection of the head and neck, trunk and extremities is normal.  Extremity: Examination of the bilateral elbows, wrists, knees and ankles reveals no crepitus or swelling. Joints are stable and range of motion is normal.  Neurologic: Cranial nerves II-XII grossly intact.  Motor tone and sensation grossly intact in all four extremities.  Deep tendon reflexes are 2+/4 at the bilateral biceps, triceps, brachioradialis, patellar and Achilles tendons. Motor strength is 5/5 at the bilateral upper and lower extremity flexors and extensors. Sensation is grossly intact in bilateral upper and lower extremities.Left hip flexion 4-5    Musculoskeletal: Gait and station are normal. Inspection of the lumbar spine reveals no scoliosis. No pain with palpation of the thoracic facets and thoracic intervertebral spaces.  Lumbar facet loading is negative.  Sacroiliac joint tenderness is negative. Patrick?s and Gaenslen?s tests are negative. No pain with palpation of the bilateral trochanteric bursae. No pain with palpation of the bilateral piriformis muscles.  No pain with internal and external rotation of bilateral hips.  Straight leg raising is negative at 70 degrees.  Range of motion of the lumbar spine is 90 degrees in anterior flexion without pain, 30 degrees in extension without pain, 30 degrees of left lateral rotation without pain, 30 degrees of right lateral rotation without pain. There are no palpable trigger points in the muscles of the low back.     Head/Neck: Cervical facet load testing is negative. Range of motion of the cervical spine is 40 degrees in anterior flexion without pain, 60 degrees in extension without pain, 80 degrees of left lateral rotation without pain, 80 degrees of right lateral rotation without pain.  The cervical spine is stable. There are no trigger points in the muscles of the head and neck. Strength and tone are normal.  Spurling's test is negative.     Imaging:  MRI of the lumbar spine 2014  Impressions:  Diffuse lumbar spondylosis, most pronounced at L4-L5. At L4-5, moderate spinal canal stenosis and moderate to severe narrowing of the left sided testicular recess is seen secondary to severe facet arthropathy, a 4x7 mm synovial cyst, ligamentum flavum thickening, and a 2 mm broad-based disc bulge.     Diagnosis:     ICD-10-CM    1. Lumbar spondylosis  M47.816       2. Lumbar radiculopathy  M54.16       3. Greater trochanteric bursitis of left hip  M70.62       4. Myofascial pain  M79.18             Assessment/Plan:    Jordan [name] is a 67 y.o. female with a past medical history of fibromyalgia since 2016 affecting her low back and bilateral hips. Patient reports she has had conservative treatment for her fibromyalgia such as medications consisting of naproxen, tramadol and lyrica. Patient reports falling backwards injuring her left hip, knee and back in February 23, 2022. Patient denies any injections for her low back pain. Patient reports muscle spasms in bilateral thighs.The patients physical examination is significant for weakness in the left hip flexor. 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 The history, physical, and imaging all correlate with lumbar radiculopathy and spondylosis.  I have advised the patient to continue conservative management with OTC medications and physical therapy. She is s/p left trochanteric bursa injection and reports at least 50% relief for approximately  1 week. She denies any redness, swelling or signs of infection. Given her continued pain and nerve stenosis we will schedule a bilateral L4/L5 TFESI to manage her ongoing symptoms. We will refill her tramadol 50mg , pregabalin 100mg  BID and naproxen during today's visit. I recommend she continue with her current medication regimen. We will follow-up in 4 weeks post procedurally to assess her progress and medication regimen.     Planned Interventions:  Bilateral L4/L5 TFESI    Medications:  Refill Tramadol 50 mg TID PRN  Refill pregabalin 100 mg BID   Robaxin 500mg  BID  Refill Naproxen 500mg      Imaging:  Reviewed     Referrals:  Physical Therapy Brett Darrington     Follow up:  4 weeks         Thank you for allowing me to participate in the care of your patient. Please do not hesitate to reach out to me with any questions or concerns.     Sincerely,      [name], M.D.  Anesthesiology and Interventional Pain Management

## 2022-06-09 NOTE — Progress Notes (Unsigned)
  Electrophysiology Office Follow up Visit Note:    Date:  06/09/2022   ID:  Tracey [name], DOB 1955-06-26, MRN [id number]  PCP:  Danella Penton, MD  [facility] Cardiologist:  Kristeen Miss, MD  [facility] Electrophysiologist:  Lanier Prude, MD    Interval History:    Tracey [name] is a 67 y.o. female who presents for a follow up visit.   I met the patient April 23, 2020 when she was admitted with cryptogenic stroke.  Loop recorder was implanted at that time. No evidence of atrial fibrillation up until May 02, 2021 when the last remote transmission was received. She saw Mardelle Matte in clinic December 18, 2021.  She stopped remote monitoring because of her next pensive monthly monitoring cost.  At the appoint with Mardelle Matte they switched her to AF alerts only.     Past medical, surgical, social and family history were reviewed.  ROS:   Please see the history of present illness.    All other systems reviewed and are negative.  EKGs/Labs/Other Studies Reviewed:    The following studies were reviewed today:     Physical Exam:    VS:  There were no vitals taken for this visit.    Wt Readings from Last 3 Encounters:  01/07/22 217 lb 12.8 oz (98.8 kg)  12/18/21 217 lb 9.6 oz (98.7 kg)  08/07/21 221 lb 4 oz (100.4 kg)     GEN: *** Well nourished, well developed in no acute distress CARDIAC: ***RRR, no murmurs, rubs, gallops RESPIRATORY:  Clear to auscultation without rales, wheezing or rhonchi       ASSESSMENT:    No diagnosis found. PLAN:    In order of problems listed above:   #Cryptogenic stroke Loop recorder in situ ***Establish remote monitoring  #Coronary artery disease Continue Plavix, Crestor No ischemic symptoms today  Follow-up 1 year with APP     Signed, Steffanie Dunn, MD, [facility], [facility] 06/09/2022 9:43 AM    Electrophysiology [facility]

## 2022-06-10 ENCOUNTER — Ambulatory Visit: Payer: Medicare HMO | Attending: Cardiology | Admitting: Cardiology

## 2022-06-10 ENCOUNTER — Encounter: Payer: Self-pay | Admitting: Cardiology

## 2022-06-10 VITALS — BP 140/96 | HR 76 | Ht 65.0 in | Wt 220.0 lb

## 2022-06-10 DIAGNOSIS — I25118 Atherosclerotic heart disease of native coronary artery with other forms of angina pectoris: Secondary | ICD-10-CM

## 2022-06-10 DIAGNOSIS — I639 Cerebral infarction, unspecified: Secondary | ICD-10-CM | POA: Diagnosis not present

## 2022-06-10 NOTE — Patient Instructions (Signed)
Medication Instructions:  Your physician recommends that you continue on your current medications as directed. Please refer to the Current Medication list given to you today.  *If you need a refill on your cardiac medications before your next appointment, please call your pharmacy*  Follow-Up: At [facility], you and your health needs are our priority.  As part of our continuing mission to provide you with exceptional heart care, we have created designated Provider Care Teams.  These Care Teams include your primary Cardiologist (physician) and Advanced Practice Providers (APPs -  Physician Assistants and Nurse Practitioners) who all work together to provide you with the care you need, when you need it.  Your next appointment:   1 year(s)  Provider:   You may see CAMERON T LAMBERT, MD or one of the following Advanced Practice Providers on your designated Care Team:   Renee Ursuy, PA-C Michael "Andy" Tillery, PA-C Suzann Riddle, NP     

## 2022-06-10 NOTE — Progress Notes (Signed)
  Electrophysiology Office Follow up Visit Note:    Date:  06/10/2022   ID:  Tracey [name], DOB 1955/05/07, MRN [id number]  PCP:  Danella Penton, MD  [facility] Cardiologist:  Kristeen Miss, MD  [facility] Electrophysiologist:  Lanier Prude, MD    Interval History:    Tracey [name] is a 67 y.o. female who presents for a follow up visit.   I met the patient April 23, 2020 when she was admitted with cryptogenic stroke.  Loop recorder was implanted at that time. No evidence of atrial fibrillation up until May 02, 2021 when the last remote transmission was received. She saw Mardelle Matte in clinic December 18, 2021.  She stopped remote monitoring because of her next pensive monthly monitoring cost.  At the appoint with Mardelle Matte they switched her to AF alerts only.  Today, she states she is feeling well. She denies feeling any recurring palpitations or arrhythmias. Currently she is on a new insurance that should cover her remote monitoring costs.  She states that she has always had lower blood pressure, usually in the 110s/60s-70s. Her blood pressure in clinic today is 140/96, which is high and atypical for her.  She denies any palpitations, chest pain, shortness of breath, or peripheral edema. No lightheadedness, headaches, syncope, orthopnea, or PND.     Past medical, surgical, social and family history were reviewed.  ROS:   Please see the history of present illness.    All other systems reviewed and are negative.  EKGs/Labs/Other Studies Reviewed:    The following studies were reviewed today:    Physical Exam:    VS:  BP (!) 140/96   Pulse 76   Ht 5\' 5"  (1.651 m)   Wt 220 lb (99.8 kg)   SpO2 98%   BMI 36.61 kg/m     Wt Readings from Last 3 Encounters:  06/10/22 220 lb (99.8 kg)  01/07/22 217 lb 12.8 oz (98.8 kg)  12/18/21 217 lb 9.6 oz (98.7 kg)     GEN: Well nourished, well developed in no acute distress CARDIAC: RRR, no murmurs, rubs, gallops. ILR pocket  well healed. RESPIRATORY:  Clear to auscultation without rales, wheezing or rhonchi       ASSESSMENT:    1. Cryptogenic stroke (HCC)   2. Coronary artery disease of native artery of native heart with stable angina pectoris (HCC)    PLAN:    In order of problems listed above:   #Cryptogenic stroke Loop recorder in situ Re-establish remote monitoring  #Coronary artery disease Continue Plavix, Crestor No ischemic symptoms today  Follow-up 1 year with APP  I,Mathew Stumpf,acting as a scribe for Lanier Prude, MD.,have documented all relevant documentation on the behalf of Lanier Prude, MD,as directed by  Lanier Prude, MD while in the presence of Lanier Prude, MD.  I, Lanier Prude, MD, have reviewed all documentation for this visit. The documentation on 06/10/22 for the exam, diagnosis, procedures, and orders are all accurate and complete.   Signed, Steffanie Dunn, MD, [facility], [facility] 06/10/2022 8:40 AM    Electrophysiology [facility]

## 2022-06-12 ENCOUNTER — Ambulatory Visit (INDEPENDENT_AMBULATORY_CARE_PROVIDER_SITE_OTHER): Payer: Medicare HMO

## 2022-06-12 DIAGNOSIS — I639 Cerebral infarction, unspecified: Secondary | ICD-10-CM

## 2022-06-12 LAB — CUP PACEART REMOTE DEVICE CHECK
Date Time Interrogation Session: 20240515231037
Implantable Pulse Generator Implant Date: 20220328

## 2022-06-16 ENCOUNTER — Encounter: Payer: Self-pay | Admitting: Cardiovascular Disease

## 2022-06-16 NOTE — Progress Notes (Unsigned)
Cardiology Office Note:    Date:  06/17/2022   ID:  Tracey [name], DOB 02-05-1955, MRN [id number]  PCP:  Danella Penton, MD   [facility]  Cardiologist:  Racquel Arkin  Advanced Practice Provider:  No care team member to display Electrophysiologist:  Lalla Brothers    :[id number]}   Referring MD: Danella Penton, MD   Chief Complaint  Patient presents with   Coronary Artery Disease         May 22, 2020   Tracey [name] is a 67 y.o. female with a hx of CVA, CAD ( s/p anterior MI with stent placement in 2010) Was a smoker at the time ,   She was admitted in March with a CVA. 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 Lalla Brothers She has moderate ( grade III) layered plaque in the transverse and descending aorta  She had mildly reduced LV function with apical akinesis.  Still speech is altered.  Going to speech therapy,  Some difficult with reading and writing . Still has some headaches   She is here to transfer from her cardiologist  at [facility]  No CP , no dyspnea She has some continued discomfort  from her implantable loop recorder   Has been found to have PAD   July 26, 2021: Tracey [name] is seen today for follow-up of her coronary artery disease.  (status post anterior wall MI in 2010 with stent placement to the LAD )She has a history of a CVA.  Transesophageal echo revealed an aneurysmal atrial septum with a PFO.  She has had an implantable loop placed by Dr. Lalla Brothers.  Doing well.  Had a loop recorder for a stroke. No further episodes of TIA or stroke   Has occasional panic attacks Wants to cut back on her ASA ( currently on ASA 162- I told her I would defer to neuro on this issue  The cost of interrogation of the loop recorder costs $200 / month.  She has a history of anteroapical myocardial infarction.  She has an area of dyskinesis in the anterior apex.  Recent echo in May did not show a thrombus but  I am concerned that if she has mural thrombus in that dyskinetic area that this may be an etiology for her stroke.  We will plan on getting an MRI to evaluate her LV and to look for thrombus in the apex.  If she does have thrombus I think she should be started on DOAC Or Coumadin and DC the ASA and plavix    January 07, 2022 Tracey [name] is seen today for follow-up of her coronary artery disease, past history of anteroapical myocardial infarction.  She had an area of dyskinesis in the anterior apex and she has had a subsequent stroke.  I suspect that she had a mural thrombus in the dyskinetic area.  Cardiac MRI reveals anterior septal and mid anterior hypokinesis.  There is apical septal akinesis with an ejection fraction of 48%.  No thrombus was seen.  Plavix was continued per the stroke team.  Feeling well   Lipid panel at [facility] in the [facility] 10/02/21 reveals an LDL of 40.  HDL is 54.  Total cholesterol is 117, triglyceride level is 113  (rosuvastatin 40 mg a day plus Zetia 10 mg a day  She is having muscle aches on the rosuvastatin 40 but will tolerate it for now  She will be on Medicare in March, 2024.  I encouraged her to consider starting a PCSK9 inhibitor at that time.   Before she signs up for her next insurance plan she needs to make sure that it will cover routine doctor visits.  She will also need to have coverage for Repatha and or Praluent  Walking regularly   Is being followed by EP , last seen by Otilio Saber, AP  I would like to have her consider implantable loop recorder when she is Medicare eligible.   Jun 16, 2022 Tracey [name] is a 67 yo female with hx of Ant. Apical MI  with evidence of an apical aneurism  Has some palpitations Had a PVC on her ECG today   Having some left leg swelling after long bus ride Has had thrombophlebitis in that leg in the past  We discussed having her see [facility].   Has seen [facility]     Lipid levels from the [facility] reveals Total cholesterol is 126 Triglyceride levels 121 HDL is 63.7 LDL is 38 Complete metabolic profile looks within normal limits. TSH is 2.433  Past Medical History:  Diagnosis Date   Anxiety    Bronchitis    CAD (coronary artery disease)    a. 04/2008: cariac arrest with prox LAD occ s/p stenting, arctic sun. b. 11/2008: restenosis s/p repeat stenting. c. 01/2011: abnormal stress test but cath reportedly OK.   Dysplastic nevus 11/04/2007   Ant. chest. Slight to moderate atypia, extends to one edge.    Dysplastic nevus 01/03/2009   Left breast. Minimal atypia, close to margin.   Hypothyroid    Myocardial infarction [facility]) 2010   stents and cardiac rehab    Stroke [facility])     Past Surgical History:  Procedure Laterality Date   ANGIOPLASTY  2010   2 stents (femoral)    BUBBLE STUDY  04/23/2020   Procedure: BUBBLE STUDY;  Surgeon: Sande Rives, MD;  Location: [facility] ENDOSCOPY;  Service: Cardiovascular;;   IR CT HEAD LTD  04/18/2020   IR PERCUTANEOUS ART THROMBECTOMY/INFUSION INTRACRANIAL INC DIAG ANGIO  04/18/2020   LOOP RECORDER INSERTION N/A 04/23/2020   Procedure: LOOP RECORDER INSERTION;  Surgeon: Lanier Prude, MD;  Location: [facility] INVASIVE CV LAB;  Service: Cardiovascular;  Laterality: N/A;   RADIOLOGY WITH ANESTHESIA N/A 04/18/2020   Procedure: IR WITH ANESTHESIA;  Surgeon: Radiologist, Medication, MD;  Location: [facility] OR;  Service: Radiology;  Laterality: N/A;   TEE WITHOUT CARDIOVERSION N/A 04/23/2020   Procedure: TRANSESOPHAGEAL ECHOCARDIOGRAM (TEE);  Surgeon: Sande Rives, MD;  Location: [facility] ENDOSCOPY;  Service: Cardiovascular;  Laterality: N/A;   TUBAL LIGATION      Current Medications: Current Meds  Medication Sig   Cholecalciferol 50 MCG (2000 UT) TABS Take 2,000 Units by mouth daily.   clopidogrel (PLAVIX) 75 MG tablet TAKE 1 TABLET BY MOUTH DAILY   cyanocobalamin (,VITAMIN B-12,) 1000 MCG/ML injection Inject 1,000 mcg into the muscle every 30  (thirty) days.   escitalopram (LEXAPRO) 10 MG tablet Take by mouth.   ezetimibe (ZETIA) 10 MG tablet Take 1 tablet (10 mg total) by mouth daily.   levothyroxine (SYNTHROID) 75 MCG tablet Take 75 mcg by mouth daily.   metoprolol succinate (TOPROL-XL) 25 MG 24 hr tablet Take 25 mg by mouth daily.   nitroGLYCERIN (NITROSTAT) 0.4 MG SL tablet Place 1 tablet (0.4 mg total) under the tongue every 5 (five) minutes as needed for chest pain.   pantoprazole (PROTONIX)  40 MG tablet Take 40 mg by mouth daily.   ramipril (ALTACE) 5 MG capsule Take 5 mg by mouth daily.   rosuvastatin (CRESTOR) 40 MG tablet Take 1 tablet (40 mg total) by mouth daily.     Allergies:   Sulfa antibiotics   Social History   Socioeconomic History   Marital status: Married    Spouse name: Not on file   Number of children: Not on file   Years of education: Not on file   Highest education level: Not on file  Occupational History   Not on file  Tobacco Use   Smoking status: Former    Packs/day: 1.5    Types: Cigarettes    Start date: 03/22/1973    Quit date: 04/27/2008    Years since quitting: 14.1   Smokeless tobacco: Never  Vaping Use   Vaping Use: Never used  Substance and Sexual Activity   Alcohol use: No    Alcohol/week: 0.0 standard drinks of alcohol   Drug use: Never   Sexual activity: Yes    Birth control/protection: Post-menopausal  Other Topics Concern   Not on file  Social History Narrative   Not on file   Social Determinants of Health   Financial Resource Strain: Not on file  Food Insecurity: Not on file  Transportation Needs: Not on file  Physical Activity: Not on file  Stress: Not on file  Social Connections: Not on file     Family History: The patient's family history includes Breast cancer in an other family member; Diabetes in her maternal grandfather and mother; Hypertension in her mother; Stroke in her maternal grandfather and maternal grandmother.  ROS:   Please see the history of  present illness.     All other systems reviewed and are negative.  EKGs/Labs/Other Studies Reviewed:    The following studies were reviewed today:   EKG:   Jun 17, 2022: Normal sinus rhythm at 76.. occasional PVCs Old septal MI    Recent Labs: No results found for requested labs within last 365 days.  Recent Lipid Panel    Component Value Date/Time   CHOL 160 06/13/2021 0800   TRIG 98 06/13/2021 0800   HDL 56 06/13/2021 0800   CHOLHDL 2.9 06/13/2021 0800   VLDL 20 06/13/2021 0800   LDLCALC 84 06/13/2021 0800     Risk Assessment/Calculations:       Physical Exam:    Physical Exam: Blood pressure 132/80, pulse 76, height 5\' 5"  (1.651 m), weight 218 lb 3.2 oz (99 kg), SpO2 95 %.       GEN:  Well nourished, well developed in no acute distress HEENT: Normal NECK: No JVD; No carotid bruits LYMPHATICS: No lymphadenopathy CARDIAC: RRR , no murmurs, rubs, gallops RESPIRATORY:  Clear to auscultation without rales, wheezing or rhonchi  ABDOMEN: Soft, non-tender, non-distended MUSCULOSKELETAL:  No edema; No deformity  SKIN: Warm and dry NEUROLOGIC:  Alert and oriented x 3,  slight dysarthria   ASSESSMENT:    No diagnosis found.    PLAN:       Coronary artery disease:    has had an ant. Apical MI.   No angina.  Has had a stroke in the past when she developed a thrombus Cont Altace, toprol XL, plavix , crestor    2.  History of CVA:     3.  Hyperlipidemia:   lipids lok great ( in the [facility] clinic March 2024) .  Cont rosuvastatin 40   4.  Left  leg thrombophlebitis:  continues to have left leg pain .  Also has considerable swelling after long car / bus trips   Will refer her to [facility] for their opinion   5.   PVCs  :  rare.   Seems to be benign at this time .      Medication Adjustments/Labs and Tests Ordered: Current medicines are reviewed at length with the patient today.  Concerns regarding medicines are outlined above.  No orders of the defined types  were placed in this encounter.  No orders of the defined types were placed in this encounter.   There are no Patient Instructions on file for this visit.   Signed, Kristeen Miss, MD  06/17/2022 9:32 AM    [facility]

## 2022-06-17 ENCOUNTER — Encounter: Payer: Self-pay | Admitting: Cardiovascular Disease

## 2022-06-17 ENCOUNTER — Ambulatory Visit: Payer: Medicare HMO | Attending: Cardiovascular Disease | Admitting: Cardiovascular Disease

## 2022-06-17 VITALS — BP 132/80 | HR 76 | Ht 65.0 in | Wt 218.2 lb

## 2022-06-17 DIAGNOSIS — I25118 Atherosclerotic heart disease of native coronary artery with other forms of angina pectoris: Secondary | ICD-10-CM

## 2022-06-17 DIAGNOSIS — E782 Mixed hyperlipidemia: Secondary | ICD-10-CM

## 2022-06-17 DIAGNOSIS — Z8672 Personal history of thrombophlebitis: Secondary | ICD-10-CM

## 2022-06-17 NOTE — Patient Instructions (Signed)
Medication Instructions:  Your physician recommends that you continue on your current medications as directed. Please refer to the Current Medication list given to you today.  *If you need a refill on your cardiac medications before your next appointment, please call your pharmacy*  Lab Work: If you have labs (blood work) drawn today and your tests are completely normal, you will receive your results only by: MyChart Message (if you have MyChart) OR A paper copy in the mail If you have any lab test that is abnormal or we need to change your treatment, we will call you to review the results.  Testing/Procedures: None ordered today.  Follow-Up: At [facility], you and your health needs are our priority.  As part of our continuing mission to provide you with exceptional heart care, we have created designated Provider Care Teams.  These Care Teams include your primary Cardiologist (physician) and Advanced Practice Providers (APPs -  Physician Assistants and Nurse Practitioners) who all work together to provide you with the care you need, when you need it.  We recommend signing up for the patient portal called "MyChart".  Sign up information is provided on this After Visit Summary.  MyChart is used to connect with patients for Virtual Visits (Telemedicine).  Patients are able to view lab/test results, encounter notes, upcoming appointments, etc.  Non-urgent messages can be sent to your provider as well.   To learn more about what you can do with MyChart, go to [email or web address].    Your next appointment:   6 month(s)  Provider:   Kristeen Miss, MD     Other Instructions You have been referred to Vein and Vascular Surgery.

## 2022-06-26 NOTE — Progress Notes (Signed)
[facility] Summary Report / Loop Recorder 

## 2022-07-02 ENCOUNTER — Encounter: Payer: Self-pay | Admitting: Internal Medicine

## 2022-07-09 ENCOUNTER — Other Ambulatory Visit: Payer: Self-pay

## 2022-07-09 ENCOUNTER — Encounter
Admission: RE | Disposition: A | Discharge status: Home / Self Care | Payer: Self-pay | Source: Home / Self Care | Attending: Internal Medicine

## 2022-07-09 ENCOUNTER — Encounter: Payer: Self-pay | Admitting: Internal Medicine

## 2022-07-09 ENCOUNTER — Ambulatory Visit: Payer: Medicare HMO | Admitting: Anesthesiology

## 2022-07-09 ENCOUNTER — Ambulatory Visit
Admission: RE | Admit: 2022-07-09 | Discharge: 2022-07-09 | Disposition: A | Discharge status: Home / Self Care | Payer: Medicare HMO | Attending: Internal Medicine | Admitting: Internal Medicine

## 2022-07-09 DIAGNOSIS — K5520 Angiodysplasia of colon without hemorrhage: Secondary | ICD-10-CM | POA: Diagnosis not present

## 2022-07-09 DIAGNOSIS — E669 Obesity, unspecified: Secondary | ICD-10-CM | POA: Diagnosis not present

## 2022-07-09 DIAGNOSIS — Z955 Presence of coronary angioplasty implant and graft: Secondary | ICD-10-CM | POA: Diagnosis not present

## 2022-07-09 DIAGNOSIS — E039 Hypothyroidism, unspecified: Secondary | ICD-10-CM | POA: Diagnosis not present

## 2022-07-09 DIAGNOSIS — I739 Peripheral vascular disease, unspecified: Secondary | ICD-10-CM | POA: Diagnosis not present

## 2022-07-09 DIAGNOSIS — F419 Anxiety disorder, unspecified: Secondary | ICD-10-CM | POA: Insufficient documentation

## 2022-07-09 DIAGNOSIS — I252 Old myocardial infarction: Secondary | ICD-10-CM | POA: Insufficient documentation

## 2022-07-09 DIAGNOSIS — Z8673 Personal history of transient ischemic attack (TIA), and cerebral infarction without residual deficits: Secondary | ICD-10-CM | POA: Insufficient documentation

## 2022-07-09 DIAGNOSIS — I2510 Atherosclerotic heart disease of native coronary artery without angina pectoris: Secondary | ICD-10-CM | POA: Diagnosis not present

## 2022-07-09 DIAGNOSIS — Z6835 Body mass index (BMI) 35.0-35.9, adult: Secondary | ICD-10-CM | POA: Diagnosis not present

## 2022-07-09 DIAGNOSIS — K219 Gastro-esophageal reflux disease without esophagitis: Secondary | ICD-10-CM | POA: Insufficient documentation

## 2022-07-09 DIAGNOSIS — Z1211 Encounter for screening for malignant neoplasm of colon: Secondary | ICD-10-CM | POA: Insufficient documentation

## 2022-07-09 DIAGNOSIS — Z87891 Personal history of nicotine dependence: Secondary | ICD-10-CM | POA: Insufficient documentation

## 2022-07-09 DIAGNOSIS — Z7902 Long term (current) use of antithrombotics/antiplatelets: Secondary | ICD-10-CM | POA: Diagnosis not present

## 2022-07-09 HISTORY — PX: COLONOSCOPY WITH PROPOFOL: SHX5780

## 2022-07-09 SURGERY — COLONOSCOPY WITH PROPOFOL
Anesthesia: General

## 2022-07-09 MED ORDER — PROPOFOL 1000 MG/100ML IV EMUL
INTRAVENOUS | Status: AC
  Filled 2022-07-09: qty 100

## 2022-07-09 MED ORDER — LIDOCAINE HCL (PF) 2 % IJ SOLN
INTRAMUSCULAR | Status: AC
  Filled 2022-07-09: qty 5

## 2022-07-09 MED ORDER — SODIUM CHLORIDE 0.9 % IV SOLN: INTRAVENOUS | Status: DC

## 2022-07-09 MED ORDER — LIDOCAINE HCL (PF) 2 % IJ SOLN
INTRAMUSCULAR | Status: DC | PRN
  Administered 2022-07-09: 40 mg via INTRADERMAL

## 2022-07-09 MED ORDER — PROPOFOL 10 MG/ML IV BOLUS
INTRAVENOUS | Status: DC | PRN
  Administered 2022-07-09: 50 mg via INTRAVENOUS
  Administered 2022-07-09 (×4): 20 mg via INTRAVENOUS

## 2022-07-09 NOTE — Op Note (Signed)
[facility] Gastroenterology Patient Name: Tracey [name] Procedure Date: 07/09/2022 7:11 AM MRN: [id number] Account #: [id number] Date of Birth: [birth year] Admit Type: Outpatient Age: 67 Room: [facility] ENDO ROOM 2 Gender: Female Note Status: Finalized Instrument Name: Prentice Docker [id number] Procedure:             Colonoscopy Indications:           Screening for colorectal malignant neoplasm Providers:             Royce Macadamia K. Norma Fredrickson MD, MD Referring MD:          Danella Penton, MD (Referring MD) Medicines:             Propofol per Anesthesia Complications:         No immediate complications. Procedure:             Pre-Anesthesia Assessment:                        - The risks and benefits of the procedure and the                         sedation options and risks were discussed with the                         patient. All questions were answered and informed                         consent was obtained.                        - Patient identification and proposed procedure were                         verified prior to the procedure by the nurse. The                         procedure was verified in the procedure room.                        - ASA Grade Assessment: III - A patient with severe                         systemic disease.                        - After reviewing the risks and benefits, the patient                         was deemed in satisfactory condition to undergo the                         procedure.                        After obtaining informed consent, the colonoscope was                         passed under direct vision. Throughout the procedure,  the patient's blood pressure, pulse, and oxygen                         saturations were monitored continuously. The                         Colonoscope was introduced through the anus and                         advanced to the the cecum, identified by appendiceal                          orifice and ileocecal valve. The colonoscopy was                         performed without difficulty. The patient tolerated                         the procedure well. The quality of the bowel                         preparation was good. The ileocecal valve, appendiceal                         orifice, and rectum were photographed. Findings:      The perianal and digital rectal examinations were normal. Pertinent       negatives include normal sphincter tone and no palpable rectal lesions.      A single small localized angioectasia without bleeding was found in the       cecum.      The exam was otherwise without abnormality on direct and retroflexion       views. Impression:            - A single non-bleeding colonic angioectasia.                        - The examination was otherwise normal on direct and                         retroflexion views.                        - No specimens collected. Recommendation:        - Patient has a contact number available for                         emergencies. The signs and symptoms of potential                         delayed complications were discussed with the patient.                         Return to normal activities tomorrow. Written                         discharge instructions were provided to the patient.                        - Resume previous diet.                        -  Continue present medications.                        - Repeat colonoscopy in 10 years for screening                         purposes.                        - Return to GI office PRN.                        - The findings and recommendations were discussed with                         the patient. Procedure Code(s):     --- Professional ---                        [phone], Colonoscopy, flexible; diagnostic, including                         collection of specimen(s) by brushing or washing, when                         performed (separate  procedure) Diagnosis Code(s):     --- Professional ---                        X91.47, Angiodysplasia of colon without hemorrhage                        Z12.11, Encounter for screening for malignant neoplasm                         of colon CPT copyright 2022 American Medical Association. All rights reserved. The codes documented in this report are preliminary and upon coder review may  be revised to meet current compliance requirements. Stanton Kidney MD, MD 07/09/2022 8:35:46 AM This report has been signed electronically. Number of Addenda: 0 Note Initiated On: 07/09/2022 7:11 AM Scope Withdrawal Time: 0 hours 6 minutes 5 seconds  Total Procedure Duration: 0 hours 9 minutes 28 seconds  Estimated Blood Loss:  Estimated blood loss: none.      [facility]

## 2022-07-09 NOTE — Transfer of Care (Signed)
Immediate Anesthesia Transfer of Care Note  Patient: Tracey [name]  Procedure(s) Performed: COLONOSCOPY WITH PROPOFOL  Patient Location: PACU and Endoscopy Unit  Anesthesia Type:MAC  Level of Consciousness: drowsy  Airway & Oxygen Therapy: Patient Spontanous Breathing  Post-op Assessment: Report given to RN and Post -op Vital signs reviewed and stable  Post vital signs: Reviewed and stable  Last Vitals:  Vitals Value Taken Time  BP 101/63 07/09/22 0836  Temp 36.2 C 07/09/22 0836  Pulse 79 07/09/22 0836  Resp 12 07/09/22 0836  SpO2 98 % 07/09/22 0836  Vitals shown include unvalidated device data.  Last Pain:  Vitals:   07/09/22 0836  TempSrc: Temporal  PainSc:          Complications: No notable events documented.

## 2022-07-09 NOTE — H&P (Signed)
Outpatient short stay form Pre-procedure 07/09/2022 8:15 AM Tracey [name] K. Norma Fredrickson, M.D.  Primary Physician: Bethann Punches, M.D.  Reason for visit:  Colon cancer screening  History of present illness:  Patient presents for colonoscopy for colon cancer screening. The patient denies complaints of abdominal pain, significant change in bowel habits, or rectal bleeding.      Current Facility-Administered Medications:    0.9 %  sodium chloride infusion, , Intravenous, Continuous, Dennis, Boykin Nearing, MD, Last Rate: 20 mL/hr at 07/09/22 0754, Continued from Pre-op at 07/09/22 0754  Medications Prior to Admission  Medication Sig Dispense Refill Last Dose   Cholecalciferol 50 MCG (2000 UT) TABS Take 2,000 Units by mouth daily.   Past Week   clopidogrel (PLAVIX) 75 MG tablet TAKE 1 TABLET BY MOUTH DAILY 90 tablet 3 07/03/2022   cyanocobalamin (,VITAMIN B-12,) 1000 MCG/ML injection Inject 1,000 mcg into the muscle every 30 (thirty) days.   Past Week   escitalopram (LEXAPRO) 10 MG tablet Take by mouth.   07/08/2022   ezetimibe (ZETIA) 10 MG tablet Take 1 tablet (10 mg total) by mouth daily. 90 tablet 3 07/08/2022   levothyroxine (SYNTHROID) 75 MCG tablet Take 75 mcg by mouth daily.   07/08/2022   metoprolol succinate (TOPROL-XL) 25 MG 24 hr tablet Take 25 mg by mouth daily.   07/08/2022   pantoprazole (PROTONIX) 40 MG tablet Take 40 mg by mouth daily.   07/08/2022   ramipril (ALTACE) 5 MG capsule Take 5 mg by mouth daily.   07/08/2022   rosuvastatin (CRESTOR) 40 MG tablet Take 1 tablet (40 mg total) by mouth daily. 90 tablet 3 07/08/2022   nitroGLYCERIN (NITROSTAT) 0.4 MG SL tablet Place 1 tablet (0.4 mg total) under the tongue every 5 (five) minutes as needed for chest pain. 25 tablet 3      Allergies  Allergen Reactions   Sulfa Antibiotics Hives     Past Medical History:  Diagnosis Date   Anxiety    Bronchitis    CAD (coronary artery disease)    a. 04/2008: cariac arrest with prox LAD occ s/p stenting,  arctic sun. b. 11/2008: restenosis s/p repeat stenting. c. 01/2011: abnormal stress test but cath reportedly OK.   Dysplastic nevus 11/04/2007   Ant. chest. Slight to moderate atypia, extends to one edge.    Dysplastic nevus 01/03/2009   Left breast. Minimal atypia, close to margin.   Hypothyroid    Myocardial infarction [facility]) 2010   stents and cardiac rehab    Stroke [facility])     Review of systems:  Otherwise negative.    Physical Exam  Gen: Alert, oriented. Appears stated age.  HEENT: [place]/AT. PERRLA. Lungs: CTA, no wheezes. CV: RR nl S1, S2. Abd: soft, benign, no masses. BS+ Ext: No edema. Pulses 2+    Planned procedures: Proceed with colonoscopy. The patient understands the nature of the planned procedure, indications, risks, alternatives and potential complications including but not limited to bleeding, infection, perforation, damage to internal organs and possible oversedation/side effects from anesthesia. The patient agrees and gives consent to proceed.  Please refer to procedure notes for findings, recommendations and patient disposition/instructions.     Jolynn Bajorek K. Norma Fredrickson, M.D. Gastroenterology 07/09/2022  8:15 AM

## 2022-07-09 NOTE — Anesthesia Preprocedure Evaluation (Addendum)
Anesthesia Evaluation  Patient identified by MRN, date of birth, ID band Patient awake    Reviewed: Allergy & Precautions, NPO status , Patient's Chart, lab work & pertinent test results  History of Anesthesia Complications Negative for: history of anesthetic complications  Airway Mallampati: II   Neck ROM: Full    Dental  (+) Missing   Pulmonary former smoker (quit 2010)   Pulmonary exam normal breath sounds clear to auscultation       Cardiovascular + CAD (s/p MI and stents on Plavix) and + Peripheral Vascular Disease  Normal cardiovascular exam+ Valvular Problems/Murmurs (PFO)  Rhythm:Regular Rate:Normal  ECG 06/17/22: Normal sinus rhythm at 76. occasional PVCs. Old septal MI    Neuro/Psych  PSYCHIATRIC DISORDERS Anxiety     CVA (03/2020, residual speech deficit)    GI/Hepatic ,GERD  ,,  Endo/Other  Hypothyroidism  Obesity   Renal/GU negative Renal ROS     Musculoskeletal   Abdominal   Peds  Hematology negative hematology ROS (+)   Anesthesia Other Findings Cardiology note 06/17/22:  1. Coronary artery disease:    has had an ant. Apical MI.   No angina.  Has had a stroke in the past when she developed a thrombus Cont Altace, toprol XL, plavix , crestor   2.  History of CVA:     3.  Hyperlipidemia:   lipids lok great ( in the [facility] clinic March 2024) .  Cont rosuvastatin 40    4.  Left leg thrombophlebitis:  continues to have left leg pain .  Also has considerable swelling after long car / bus trips    Will refer her to [facility] for their opinion    5.   PVCs  :  rare.   Seems to be benign at this time .  Reproductive/Obstetrics                             Anesthesia Physical Anesthesia Plan  ASA: 3  Anesthesia Plan: General   Post-op Pain Management:    Induction: Intravenous  PONV Risk Score and Plan: 3 and Propofol infusion, TIVA and Treatment may vary due to age or medical  condition  Airway Management Planned: Natural Airway  Additional Equipment:   Intra-op Plan:   Post-operative Plan:   Informed Consent: I have reviewed the patients History and Physical, chart, labs and discussed the procedure including the risks, benefits and alternatives for the proposed anesthesia with the patient or authorized representative who has indicated his/her understanding and acceptance.       Plan Discussed with: CRNA  Anesthesia Plan Comments: (LMA/GETA backup discussed.  Patient consented for risks of anesthesia including but not limited to:  - adverse reactions to medications - damage to eyes, teeth, lips or other oral mucosa - nerve damage due to positioning  - sore throat or hoarseness - damage to heart, brain, nerves, lungs, other parts of body or loss of life  Informed patient about role of CRNA in peri- and intra-operative care.  Patient voiced understanding.)        Anesthesia Quick Evaluation

## 2022-07-09 NOTE — Interval H&P Note (Signed)
History and Physical Interval Note:  07/09/2022 8:16 AM  [name]  has presented today for surgery, with the diagnosis of V76.51 (ICD-9-CM) - Z12.11 (ICD-10-CM) - Colon cancer screening.  The various methods of treatment have been discussed with the patient and family. After consideration of risks, benefits and other options for treatment, the patient has consented to  Procedure(s): COLONOSCOPY WITH PROPOFOL (N/A) as a surgical intervention.  The patient's history has been reviewed, patient examined, no change in status, stable for surgery.  I have reviewed the patient's chart and labs.  Questions were answered to the patient's satisfaction.     Earl, Ashkum

## 2022-07-09 NOTE — Anesthesia Postprocedure Evaluation (Signed)
Anesthesia Post Note  Patient: Tracey [name]  Procedure(s) Performed: COLONOSCOPY WITH PROPOFOL  Patient location during evaluation: PACU Anesthesia Type: General Level of consciousness: awake and alert, oriented and patient cooperative Pain management: pain level controlled Vital Signs Assessment: post-procedure vital signs reviewed and stable Respiratory status: spontaneous breathing, nonlabored ventilation and respiratory function stable Cardiovascular status: blood pressure returned to baseline and stable Postop Assessment: adequate PO intake Anesthetic complications: no   No notable events documented.   Last Vitals:  Vitals:   07/09/22 0729 07/09/22 0836  BP: (!) 142/73 101/63  Pulse: 95   Resp: 16   Temp: (!) 36.1 C (!) 36.2 C  SpO2: 99%     Last Pain:  Vitals:   07/09/22 0856  TempSrc:   PainSc: 0-No pain                 Reed Breech

## 2022-07-10 ENCOUNTER — Encounter: Payer: Self-pay | Admitting: Internal Medicine

## 2022-07-10 ENCOUNTER — Other Ambulatory Visit: Payer: Self-pay | Admitting: *Deleted

## 2022-07-10 DIAGNOSIS — I70213 Atherosclerosis of native arteries of extremities with intermittent claudication, bilateral legs: Secondary | ICD-10-CM

## 2022-07-15 ENCOUNTER — Ambulatory Visit: Payer: Medicare HMO

## 2022-07-15 DIAGNOSIS — I639 Cerebral infarction, unspecified: Secondary | ICD-10-CM | POA: Diagnosis not present

## 2022-07-15 LAB — CUP PACEART REMOTE DEVICE CHECK
Date Time Interrogation Session: 20240617230518
Implantable Pulse Generator Implant Date: 20220328

## 2022-07-18 ENCOUNTER — Ambulatory Visit (HOSPITAL_COMMUNITY)
Admission: RE | Admit: 2022-07-18 | Discharge: 2022-07-18 | Disposition: A | Discharge status: Home / Self Care | Payer: Medicare HMO | Source: Ambulatory Visit | Attending: Vascular Surgery | Admitting: Vascular Surgery

## 2022-07-18 ENCOUNTER — Other Ambulatory Visit: Payer: Self-pay | Admitting: Cardiovascular Disease

## 2022-07-18 DIAGNOSIS — I639 Cerebral infarction, unspecified: Secondary | ICD-10-CM

## 2022-07-18 DIAGNOSIS — I70213 Atherosclerosis of native arteries of extremities with intermittent claudication, bilateral legs: Secondary | ICD-10-CM | POA: Diagnosis not present

## 2022-07-18 DIAGNOSIS — I25118 Atherosclerotic heart disease of native coronary artery with other forms of angina pectoris: Secondary | ICD-10-CM

## 2022-07-23 ENCOUNTER — Ambulatory Visit: Payer: Medicare HMO | Admitting: Physician Assistant

## 2022-07-23 VITALS — BP 123/66 | HR 77 | Temp 97.5°F | Resp 18 | Ht 65.0 in | Wt 219.7 lb

## 2022-07-23 DIAGNOSIS — I872 Venous insufficiency (chronic) (peripheral): Secondary | ICD-10-CM | POA: Diagnosis not present

## 2022-07-23 NOTE — Progress Notes (Signed)
Requested by:  Danella Penton, MD [phone] [facility] [address] [facility] [place],  [place] [address]  Reason for consultation: thrombophlebitis and pain in LLE    History of Present Illness   Tracey [name] is a 67 y.o. ([birth year]) female who presents for evaluation of painful veins in left leg. She describes Burning, aching, and stinging in left thigh in area of large varicose vein. She says this discomfort increased recently after 9 hour bus ride. Also reports increased swelling of leg/ knee. She says she has had Thrombophlebitis before in this vein. She reports vein has been present for along time and she has had spider veins on her legs for many years. She does not elevate her legs. She has tried compression in the past years ago and just recalls the challenge of getting them on. 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 She denies any history of DVT. She does have family history of venous disease in her grandmother.   Venous symptoms include: aching, burning, itching, swelling Onset/duration: many years, increased symptoms after recent prolonged travel  Occupation:  retired Aggravating factors: (sitting, standing) Alleviating factors: (elevation) Compression:  yes, minimal use Helps:  not sure Pain medications:  no Previous vein procedures:  no History of DVT:  no  Past Medical History:  Diagnosis Date   Anxiety    Bronchitis    CAD (coronary artery disease)    a. 04/2008: cariac arrest with prox LAD occ s/p stenting, arctic sun. b. 11/2008: restenosis s/p repeat stenting. c. 01/2011: abnormal stress test but cath reportedly OK.   Dysplastic nevus 11/04/2007   Ant. chest. Slight to moderate atypia, extends to one edge.    Dysplastic nevus 01/03/2009   Left breast. Minimal atypia, close to margin.   Hypothyroid    Myocardial infarction [facility]) 2010   stents and cardiac rehab    Stroke [facility])     Past Surgical History:  Procedure  Laterality Date   ANGIOPLASTY  2010   2 stents (femoral)    BUBBLE STUDY  04/23/2020   Procedure: BUBBLE STUDY;  Surgeon: Sande Rives, MD;  Location: [facility] ENDOSCOPY;  Service: Cardiovascular;;   COLONOSCOPY WITH PROPOFOL N/A 07/09/2022   Procedure: COLONOSCOPY WITH PROPOFOL;  Surgeon: Toledo, Boykin Nearing, MD;  Location: [facility] ENDOSCOPY;  Service: Gastroenterology;  Laterality: N/A;   IR CT HEAD LTD  04/18/2020   IR PERCUTANEOUS ART THROMBECTOMY/INFUSION INTRACRANIAL INC DIAG ANGIO  04/18/2020   LOOP RECORDER INSERTION N/A 04/23/2020   Procedure: LOOP RECORDER INSERTION;  Surgeon: Lanier Prude, MD;  Location: [facility] INVASIVE CV LAB;  Service: Cardiovascular;  Laterality: N/A;   RADIOLOGY WITH ANESTHESIA N/A 04/18/2020   Procedure: IR WITH ANESTHESIA;  Surgeon: Radiologist, Medication, MD;  Location: [facility] OR;  Service: Radiology;  Laterality: N/A;   TEE WITHOUT CARDIOVERSION N/A 04/23/2020   Procedure: TRANSESOPHAGEAL ECHOCARDIOGRAM (TEE);  Surgeon: Sande Rives, MD;  Location: [facility] ENDOSCOPY;  Service: Cardiovascular;  Laterality: N/A;   TUBAL LIGATION      Social History   Socioeconomic History   Marital status: Married    Spouse name: Not on file   Number of children: Not on file   Years of education: Not on file   Highest education level: Not on file  Occupational History   Not on file  Tobacco Use   Smoking status: Former    Packs/day: 1.5    Types: Cigarettes    Start date: 03/22/1973  Quit date: 04/27/2008    Years since quitting: 14.2   Smokeless tobacco: Never  Vaping Use   Vaping Use: Never used  Substance and Sexual Activity   Alcohol use: No    Alcohol/week: 0.0 standard drinks of alcohol   Drug use: Never   Sexual activity: Yes    Birth control/protection: Post-menopausal  Other Topics Concern   Not on file  Social History Narrative   Not on file   Social Determinants of Health   Financial Resource Strain: Not on file  Food Insecurity: Not on file   Transportation Needs: Not on file  Physical Activity: Not on file  Stress: Not on file  Social Connections: Not on file  Intimate Partner Violence: Not on file   Family History  Problem Relation Age of Onset   Diabetes Mother    Hypertension Mother    Stroke Maternal Grandmother    Diabetes Maternal Grandfather    Stroke Maternal Grandfather    Breast cancer Other     Current Outpatient Medications  Medication Sig Dispense Refill   Cholecalciferol 50 MCG (2000 UT) TABS Take 2,000 Units by mouth daily.     clopidogrel (PLAVIX) 75 MG tablet TAKE 1 TABLET BY MOUTH DAILY 90 tablet 3   cyanocobalamin (,VITAMIN B-12,) 1000 MCG/ML injection Inject 1,000 mcg into the muscle every 30 (thirty) days.     escitalopram (LEXAPRO) 10 MG tablet Take by mouth.     ezetimibe (ZETIA) 10 MG tablet TAKE 1 TABLET BY MOUTH DAILY 90 tablet 3   levothyroxine (SYNTHROID) 75 MCG tablet Take 75 mcg by mouth daily.     metoprolol succinate (TOPROL-XL) 25 MG 24 hr tablet Take 25 mg by mouth daily.     nitroGLYCERIN (NITROSTAT) 0.4 MG SL tablet Place 1 tablet (0.4 mg total) under the tongue every 5 (five) minutes as needed for chest pain. 25 tablet 3   pantoprazole (PROTONIX) 40 MG tablet Take 40 mg by mouth daily.     ramipril (ALTACE) 5 MG capsule Take 5 mg by mouth daily.     rosuvastatin (CRESTOR) 40 MG tablet Take 1 tablet (40 mg total) by mouth daily. 90 tablet 3   No current facility-administered medications for this visit.    Allergies  Allergen Reactions   Sulfa Antibiotics Hives    REVIEW OF SYSTEMS (negative unless checked):   Cardiac:  []  Chest pain or chest pressure? []  Shortness of breath upon activity? []  Shortness of breath when lying flat? []  Irregular heart rhythm?  Vascular:  []  Pain in calf, thigh, or hip brought on by walking? []  Pain in feet at night that wakes you up from your sleep? []  Blood clot in your veins? []  Leg swelling?  Pulmonary:  []  Oxygen at home? []   Productive cough? []  Wheezing?  Neurologic:  []  Sudden weakness in arms or legs? []  Sudden numbness in arms or legs? []  Sudden onset of difficult speaking or slurred speech? []  Temporary loss of vision in one eye? []  Problems with dizziness?  Gastrointestinal:  []  Blood in stool? []  Vomited blood?  Genitourinary:  []  Burning when urinating? []  Blood in urine?  Psychiatric:  []  Major depression  Hematologic:  []  Bleeding problems? []  Problems with blood clotting?  Dermatologic:  []  Rashes or ulcers?  Constitutional:  []  Fever or chills?  Ear/Nose/Throat:  []  Change in hearing? []  Nose bleeds? []  Sore throat?  Musculoskeletal:  []  Back pain? []  Joint pain? []  Muscle pain?   Physical Examination  Vitals:   07/23/22 1007  BP: 123/66  Pulse: 77  Resp: 18  Temp: (!) 97.5 F (36.4 C)  TempSrc: Temporal  SpO2: 97%  Weight: 219 lb 11.2 oz (99.7 kg)  Height: 5\' 5"  (1.651 m)   Body mass index is 36.56 kg/m.  General:  WDWN in NAD; vital signs documented above Gait: Normal  HENT: WNL, normocephalic Pulmonary: normal non-labored breathing , without wheezing Cardiac: regular HR Abdomen: soft, NT, no masses Vascular Exam/Pulses: 2+ pulses bilaterally  Extremities: with varicose vein of left anterior thigh, with reticular veins bilateral , without edema, without stasis pigmentation, without lipodermatosclerosis, without ulcers Musculoskeletal: no muscle wasting or atrophy  Neurologic: A&O X 3;  No focal weakness or paresthesias are detected Psychiatric:  The pt has Normal affect.  Non-invasive Vascular Imaging   BLE Venous Insufficiency Duplex (07/23/22): LLE: No DVT and SVT No GSV reflux, leaves fascia in distal thigh GSV diameter 0.25-0.35 SSV reflux Popliteal fossa to mid calf CFV deep venous reflux   Medical Decision Making   Tracey [name] is a 67 y.o. female who presents with: LLE chronic venous insufficiency with varicose veins with  pain. Her duplex today shows no DVT or SVT. She does not have any GSV reflux and it leaves the fascia in mid thigh. She does have SSV reflux throughout. She also does have deep reflux in her CFV. Based on her duplex today she would not be a candidate for venous ablation.  Based on the patient's history and examination, I recommend: elevation daily above level of heart, knee high compression stockings 15-20 mmHg, exercise, weight reduction, refraining from prolonged sitting or standing She was measured and fitted for knee high compression stockings at today's visit She can follow up as needed if she has new or concerning symptoms   Graceann Congress, PA-C [facility] Office: [phone]  07/23/2022, 11:02 AM  Clinic MD: Caesar Bookman

## 2022-07-30 ENCOUNTER — Ambulatory Visit: Payer: MEDICARE

## 2022-07-30 MED ORDER — PREGABALIN 100 MG PO CAPS
ORAL_CAPSULE
Start: 2022-07-30 — End: ?

## 2022-07-31 MED ORDER — PREGABALIN 100 MG PO CAPS
100 mg | ORAL_CAPSULE | Freq: Two times a day (BID) | ORAL | 1 refills | Status: AC
Start: 2022-07-31 — End: 2022-08-07

## 2022-08-06 ENCOUNTER — Other Ambulatory Visit: Payer: Self-pay | Admitting: Cardiovascular Disease

## 2022-08-06 ENCOUNTER — Telehealth: Payer: BLUE CROSS/BLUE SHIELD | Attending: Student in an Organized Health Care Education/Training Program

## 2022-08-06 DIAGNOSIS — M47816 Spondylosis without myelopathy or radiculopathy, lumbar region: Secondary | ICD-10-CM

## 2022-08-06 DIAGNOSIS — M7918 Myalgia, other site: Secondary | ICD-10-CM

## 2022-08-06 DIAGNOSIS — M5416 Radiculopathy, lumbar region: Secondary | ICD-10-CM

## 2022-08-06 NOTE — Progress Notes
Date:  08/06/2022    Name:  Jordan [name]  MRN#:  [id number]  DOB:  21-Sep-1955  Age:  67 y.o.      Jordan [name], M.D.  ANESTHESIOLOGY AND INTERVENTIONAL PAIN MANAGEMENT       Interval History:     Virtual visitation conducted remotely:  The patient reports having received an epidural injection from Dr. Kelli Hope approximately six weeks ago, which has started to wear off. The patient states that the epidural helped in many ways but did not alleviate hip pain. The patient has since seen Dr. Janene Harvey, a rheumatologist, who discontinued naproxen and prescribed diclofenac. The patient also mentions muscle loss in the affected leg and was advised to undergo an EMG. An MRI revealed a stress fracture at the head of the femur, inflammation of the bone marrow, and the entire hip joint. The patient has been in contact with Dr. Leta Jungling, who did not want to inject cortisone into the hip due to the fracture.    The patient reports slight improvement in hip pain but is still unable to put full weight on the affected leg, walk up stairs, or step on a parking brake. The patient is currently taking Lyrica, tramadol, and diclofenac, with Lyrica requiring a refill. The patient has plans to travel to [place] for the Olympics and has concerns about potential pain flare-ups during the trip. The patient is considering another epidural injection before departure to help with the flight.    The physical examination findings below are obtained from the previous visit. There is no subjective/objective change in examination findings.       Injection History:  06/30/22: Bilateral L4/5 TFESI with Dr Jacqulyn Cane    PMHx:  No past medical history on file.    Physical Exam:    Pulmonary: Clear to auscultation bilaterally, no wheezing/crackles  Cardiac: Regular rate and rhythm, no murmurs  Abdomen: soft, nontender, nondistended  Lymph nodes: no enlarged lymph nodes in bilaterally axillae  Skin: Inspection of the head and neck, trunk and extremities is normal.  Extremity: Examination of the bilateral elbows, wrists, knees and ankles reveals no crepitus or swelling. Joints are stable and range of motion is normal.  Neurologic: Cranial nerves II-XII grossly intact.  Motor tone and sensation grossly intact in all four extremities.  Deep tendon reflexes are 2+/4 at the bilateral biceps, triceps, brachioradialis, patellar and Achilles tendons. Motor strength is 5/5 at the bilateral upper and lower extremity flexors and extensors. Sensation is grossly intact in bilateral upper and lower extremities.Left hip flexion 4-5    Musculoskeletal: Gait and station are normal. Inspection of the lumbar spine reveals no scoliosis. No pain with palpation of the thoracic facets and thoracic intervertebral spaces.  Lumbar facet loading is positive.  Sacroiliac joint tenderness is negative. Patrick?s and Gaenslen?s tests are negative. No pain with palpation of the bilateral trochanteric bursae. No pain with palpation of the bilateral piriformis muscles.  No pain with internal and external rotation of bilateral hips.  Straight leg raising is positive at 70 degrees.  Range of motion of the lumbar spine is 90 degrees in anterior flexion without pain, 30 degrees in extension with pain, 30 degrees of left lateral rotation without pain, 30 degrees of right lateral rotation without pain. There are some palpable trigger points in the muscles of the low back.     Head/Neck: Cervical facet load testing is negative. Range of motion of the cervical spine is 40 degrees in anterior flexion without pain, 60  degrees in extension without pain, 80 degrees of left lateral rotation without pain, 80 degrees of right lateral rotation without pain.  The cervical spine is stable. There are no trigger points in the muscles of the head and neck. Strength and tone are normal.  Spurling's test is negative.     Imaging:  MRI L Spine 02/2022        Diagnosis:     ICD-10-CM    1. Lumbar spondylosis  M47.816       2. Lumbar radiculopathy  M54.16       3. Myofascial pain  M79.18           Assessment/Plan:    [name] is a 67 y.o. female with a past medical history of fibromyalgia since 2016 affecting her low back and bilateral hips. Patient reports she has had conservative treatment for her fibromyalgia such as medications consisting of diclofenac, tramadol and lyrica. Patient reports falling backwards injuring her left hip, knee and back in February 23, 2022.  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 The history, physical, and imaging all correlate with lumbar radiculopathy and spondylosis. She is 6 weeks s/p lumbar l4/5 TFESI (with Outside provider) with 50% relief in symptoms, however with a hip MRI demonstrating a stress fracture which is slowly healing. We will refill her pregabalin 100mg  BID and has switched her naproxen to Diclofenac.     The patient reports that the benefits of an epidural injection received six weeks ago are wearing off, with persistent hip pain despite slight improvement. An MRI revealed a stress fracture at the head of the femur and inflammation of the bone marrow and hip joint. The patient is currently on diclofenac, Lyrica, and tramadol, and is considering another epidural injection before traveling to [place] for the Olympics. The patient has muscle loss in the affected leg and was advised to undergo an EMG. A follow-up is scheduled for August 5th.    Planned Interventions:  None    Medications:  Refill Tramadol 50 mg TID PRN (prescribed by Dr Derenda Fennel)  Refill pregabalin 100 mg BID RX 08/06/22.   I counseled the patient on the risks of pregabalin use and misuse, including tolerance, hyperalgesia, withdrawal, overdose, and death. The patient understands the benefit and risks involved. Cures reporting was checked.   Robaxin 500mg  BID  Refill Naproxen 500mg    Medrol dosepak RX 08/06/22    Imaging:  Reviewed     Referrals:  Physical Therapy Genelle Bal Darrington     Follow up:  4 weeks         Thank you for allowing me to participate in the care of your patient. Please do not hesitate to reach out to me with any questions or concerns.     Sincerely,      Jordan [name], M.D.  Anesthesiology and Interventional Pain Management

## 2022-08-06 NOTE — Progress Notes (Signed)
[facility] Summary Report / Loop Recorder 

## 2022-08-07 MED ORDER — PREGABALIN 100 MG PO CAPS
100 mg | ORAL_CAPSULE | Freq: Two times a day (BID) | ORAL | 1 refills | Status: AC
Start: 2022-08-07 — End: ?

## 2022-08-07 MED ORDER — METHYLPREDNISOLONE 4 MG PO TBPK
ORAL_TABLET | 0 refills | Status: AC
Start: 2022-08-07 — End: ?

## 2022-08-18 ENCOUNTER — Ambulatory Visit (INDEPENDENT_AMBULATORY_CARE_PROVIDER_SITE_OTHER): Payer: Medicare HMO

## 2022-08-18 DIAGNOSIS — I639 Cerebral infarction, unspecified: Secondary | ICD-10-CM | POA: Diagnosis not present

## 2022-08-18 LAB — CUP PACEART REMOTE DEVICE CHECK
Date Time Interrogation Session: 20240720230450
Implantable Pulse Generator Implant Date: 20220328

## 2022-08-28 NOTE — Progress Notes (Signed)
[facility] Summary Report / Loop Recorder 

## 2022-09-19 LAB — CUP PACEART REMOTE DEVICE CHECK
Date Time Interrogation Session: 20240822230713
Implantable Pulse Generator Implant Date: 20220328

## 2022-09-22 ENCOUNTER — Ambulatory Visit: Payer: Medicare HMO

## 2022-09-22 DIAGNOSIS — I639 Cerebral infarction, unspecified: Secondary | ICD-10-CM | POA: Diagnosis not present

## 2022-10-01 NOTE — Progress Notes (Signed)
[facility] Summary Report / Loop Recorder 

## 2022-10-12 MED ORDER — PREGABALIN 100 MG PO CAPS
ORAL_CAPSULE | ORAL | 0.00 refills | 30.00000 days | Status: AC
Start: 2022-10-12 — End: ?

## 2022-10-13 ENCOUNTER — Ambulatory Visit: Payer: MEDICARE | Attending: Student in an Organized Health Care Education/Training Program

## 2022-10-13 MED ORDER — PREGABALIN 100 MG PO CAPS
ORAL_CAPSULE | 1 refills | Status: AC
Start: 2022-10-13 — End: 2022-10-18

## 2022-10-17 ENCOUNTER — Ambulatory Visit: Payer: BLUE CROSS/BLUE SHIELD | Attending: Student in an Organized Health Care Education/Training Program

## 2022-10-17 DIAGNOSIS — M5416 Radiculopathy, lumbar region: Secondary | ICD-10-CM

## 2022-10-17 DIAGNOSIS — M47816 Spondylosis without myelopathy or radiculopathy, lumbar region: Secondary | ICD-10-CM

## 2022-10-17 DIAGNOSIS — M7918 Myalgia, other site: Secondary | ICD-10-CM

## 2022-10-17 MED ORDER — PREGABALIN 100 MG PO CAPS
100 mg | ORAL_CAPSULE | Freq: Three times a day (TID) | ORAL | 1 refills | Status: AC
Start: 2022-10-17 — End: ?

## 2022-10-17 MED ORDER — BACLOFEN 10 MG PO TABS
10 mg | ORAL_TABLET | Freq: Every day | ORAL | 0 refills | Status: AC | PRN
Start: 2022-10-17 — End: ?

## 2022-10-17 NOTE — Progress Notes
Date:  10/17/2022    Name:  Jordan [name]  MRN#:  [id number]  DOB:  [birth year]  Age:  67 y.o.      [name], M.D.  ANESTHESIOLOGY AND INTERVENTIONAL PAIN MANAGEMENT     Interval History:     Jordan [name] presents for a progress visit follow up. The patient presents with left hip pain after sitting legs crossed on the ground and bent forwards hearing cracks. When she finished she began to notice severe pain in her left hip. She experiences night pain. The pain is  Constant dull and stabbing pain localized to left hip and radiates into the groin. On the NRS pain scale, the pain ranges from a 6 to 9/10. It is aggravated by  rotation of the right hip . It is alleviated by ice and cold laser therapy . Patient tried NSAIDs, tylenol, ice, physical therapy, and injections without success.  She went to her rheumatologist where she received shock wave therapy, which resulted in short term relief. She received an x-ray from outside providers which demonstrated that the right hip is displaced. She has previously had a cortisone injection into the right hip with mild relief. 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     Injection History:  06/30/22: Bilateral L4/5 TFESI with Dr Jacqulyn Cane    PMHx:  No past medical history on file.    Physical Exam:    Pulmonary: Clear to auscultation bilaterally, no wheezing/crackles  Cardiac: Regular rate and rhythm, no murmurs  Abdomen: soft, nontender, nondistended  Lymph nodes: no enlarged lymph nodes in bilaterally axillae  Skin: Inspection of the head and neck, trunk and extremities is normal.  Extremity: Examination of the bilateral elbows, wrists, knees and ankles reveals no crepitus or swelling. Joints are stable and range of motion is normal.  Neurologic: Cranial nerves II-XII grossly intact.  Motor tone and sensation grossly intact in all four extremities.  Deep tendon reflexes are 2+/4 at the bilateral biceps, triceps, brachioradialis, patellar and Achilles tendons. Motor strength is 5/5 at the bilateral upper and lower extremity flexors and extensors. Sensation is grossly intact in bilateral upper and lower extremities.Left hip flexion 4-5    Musculoskeletal: Gait and station are normal. Inspection of the lumbar spine reveals no scoliosis. No pain with palpation of the thoracic facets and thoracic intervertebral spaces.  Lumbar facet loading is positive.  Sacroiliac joint tenderness is negative. Patrick?s and Gaenslen?s tests are negative. No pain with palpation of the bilateral trochanteric bursae. No pain with palpation of the bilateral piriformis muscles.  No pain with internal and external rotation of bilateral hips.  Straight leg raising is positive at 70 degrees.  Range of motion of the lumbar spine is 90 degrees in anterior flexion without pain, 30 degrees in extension with pain, 30 degrees of left lateral rotation without pain, 30 degrees of right lateral rotation without pain. There are some palpable trigger points in the muscles of the low back.     Head/Neck: Cervical facet load testing is negative. Range of motion of the cervical spine is 40 degrees in anterior flexion without pain, 60 degrees in extension without pain, 80 degrees of left lateral rotation without pain, 80 degrees of right lateral rotation without pain.  The cervical spine is stable. There are no trigger points in the muscles of the head and neck. Strength and tone are normal.  Spurling's test is negative.     Imaging:  MRI L Spine 02/2022        Diagnosis:     ICD-10-CM    1. Lumbar spondylosis  M47.816       2. Lumbar radiculopathy  M54.16       3. Myofascial pain  M79.18             Assessment/Plan:    Jordan [name] is a 67 y.o. female with a past medical history of fibromyalgia since 2016 affecting her low back and bilateral hips. Patient reports she has had conservative treatment for her fibromyalgia such as medications consisting of diclofenac, tramadol and lyrica. Patient reports falling backwards injuring her left hip, knee and back in February 23, 2022.  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 The history, physical, and imaging all correlate with lumbar radiculopathy and spondylosis. She is 6 weeks s/p lumbar l4/5 TFESI (with Outside provider) with 50% relief in symptoms, however with a hip MRI demonstrating a stress fracture which is slowly healing. We will refill her pregabalin 100mg  BID and has switched her naproxen to Diclofenac.     Jordan [name] presents with left hip pain and difficulty walking following a stretching incident two and a half weeks ago, with imaging revealing a dislocated hip. She manages her pain with Lyrica and Tramadol but experiences increased pain and sleep disturbances at night. Despite recommendations for hip replacement surgery, she prefers to delay the procedure. A recent cortisone injection has provided some pain relief. The plan includes conservative management, potential medication adjustments, and close monitoring of her condition .We will generate a referral to orthopedic spine surgeon Dr. Dyane Dustman to establish care and discuss surgical options.       I counseled the patient on the risks of opioid use and misuse, including tolerance, hyperalgesia, withdrawal, overdose, and death. The patient understands the benefit and risks involved.  OPIOID DISCUSSION  [x]  CURES checked  [x]  Discussed multimodal options including but not limited to acupuncture, chiropractor care, massage, heat, ice, stretching, physical therapy, TENs, multimodal medications, meditation, biofeedback.  [x]  Offered weaning strategies and support  [x]  Opioid contract signed and on file  [x]  The 5 A's of Opioids were reviewed (Affect= normal, [place] = no gross signs of medication abuse or diversion, Activity = no worsening in activity level, Adverse = no significant side effects, Analgesia = not experiencing a worsening in pain)  [x]  Risks of addiction, hyperalgesia, fatal overdose, decreased energy levels, endocrine abnormalities, nausea, constipation, ileus, and immune supression discussed with patient.  [x]  Naloxone medication offered  [x]  Explained to patient increased risk of adverse events including death in setting of alcohol use, marijuana use, benzodiazeapine use, barbituates, anti-epileptic drugs, muscle relaxants, sleep medications, and over-the-counter medications. Patient voiced understanding.   [x]  Goals for pain and function reviewed with patient.  [x]  Counseled patient on safe use of opioids.  [x]  Patient understands goal is to wean opioid medications over time, if possible.  [x]  I recommended the patient avoid use of sedatives, benzodiazepines, alcohol, marijuana and other illicit and other over-the-medications that may depress CNS and respiratory function.       Planned Interventions:  None    Medications:  Refill Tramadol 50 mg BID PRN (prescribed by Dr Derenda Fennel)  Refill pregabalin 100 mg increase to TID RX 10/17/22.    Robaxin 500mg  BID  Refill Naproxen 500mg      Imaging:  Reviewed     Referrals:  Physical Therapy Joneen Caraway   Dr Dyane Dustman     Follow up:  4 weeks         Thank you for allowing me to participate in the care of your patient. Please do not hesitate to reach out to me with any questions or concerns.     Sincerely,      [name], M.D.  Anesthesiology and Interventional Pain Management

## 2022-10-17 NOTE — Addendum Note
Addended by: [name] on: 10/17/2022 04:17 PM     Modules accepted: Orders

## 2022-10-21 ENCOUNTER — Ambulatory Visit: Payer: BLUE CROSS/BLUE SHIELD

## 2022-10-21 DIAGNOSIS — M1611 Unilateral primary osteoarthritis, right hip: Secondary | ICD-10-CM

## 2022-10-21 DIAGNOSIS — M25552 Pain in left hip: Secondary | ICD-10-CM

## 2022-10-21 DIAGNOSIS — M1612 Unilateral primary osteoarthritis, left hip: Secondary | ICD-10-CM

## 2022-10-21 NOTE — Progress Notes
Date:  10/21/2022    Name:  Jordan [name]  MRN#:  [id number]  DOB:  02/03/1955  Age:  67 y.o.      Enid Skeens, M.D.    The patient was seen in [facility].    CHIEF COMPLAINT: Left Hip Pain    HISTORY: Jordan [name] is a 67 y.o. female, presenting today, 10/21/2022, for an orthopedic evaluation of the left hip. For several years, the patient has c/o left hip pain.    The pain is described as 5/10 in severity. The pain is intermittent in frequency. The pain has stayed the same since its onset. The pain can be exacerbated by weight bearing, twisting, and pivoting activities. The pain improves with rest. Also, the patient experiences significant hip stiffness. The patient admits mechanical symptoms, such as popping, clicking, grinding. The patient denies falls / instability. Overall, the patient reports her hip pain is disruptive to her quality of life.    The patient has failed conservative treatments, as detailed below.    PRIOR TREATMENT HAS INCLUDED:  [x]  Cortisone Injection   []  Hyaluronic Acid Injection    [x]  Ice / Heat  [x]  NSAIDs/OTC medications such as Tylenol  []  Physical Therapy    []  Surgical Procedure  []  None    Pt reports left hip pain. Reports cracking sensation in the L hip that provided brief relief of pain. Referred by Dr Clyda Hurdle. Pt is a Paediatric nurse.     PAST/MEDICAL/SOCIAL/FAMILY HISTORY: The patient?s intake sheet, including her past medical history, past surgical history, medicines, allergies, social and family history was reviewed by myself and the patient today, these are noted in the patient?s electronic medical record and their relevance to the ongoing problems was considered.    ALLERGIES: All allergies have been listed by the patient and entered into the clinical summary of the electronic medical record.    REVIEW OF SYSTEMS: The 14-point review of systems as documented today in the medical record is remarkable for the positive orthopedic problems discussed above and their relevance was considered with respect to Constitutional, ENT, Cardiovascular, GU, Skin, Neurologic, Endocrine, Hematologic, Psychiatric, Gastrointestinal, Respiratory, Eyes and Allergic/Immunologic systems.    PHYSICAL EXAM:  CONSTITUTIONAL: Jordan [name] is a 67 y.o. female in no acute distress.  The patient walks without any assistive device.  The patient walks with an antalgic gait.    PSYC: She is alert and oriented x3 with a normal mood and affect.  HENT: AT/NC; hearing intact  EYES: PERRLA. Extra-ocular movements are intact.  SKIN: Normal temperature. There are no rashes, ulcerations, or lesions.  LYMPHATIC: No induration or enlargement.  EXTREMITIES: No swelling or calf tenderness. No clubbing. No cyanosis.     HIP EXAM  Left Hip:  There is no erythema present. There is no ecchymosis.  There are no visible scars or incisions.  There is no swelling. There are no palpable masses.  The skin is intact over the hip.    There is pain with hip motion.  There is mild tenderness to palpation over the greater trochanter.   1 cm leg length discrepancy of the left leg compared to the right.     Range of Motion:  Flexion 90  Extension 0  Internal Rotation 5  External Rotation 30  Abduction 30    Motor Strength:    Hip Flexion 4+   Hip Extension 5   Leg Abductors (Superior Gluteal (L4-S1) 5  Knee Extension (Femoral (L2-L4)) 5   Knee Flexion (Sciatic (L3-L4)) 5     Vascular  DP: Palpable  PT: Palpable    RADIOGRAPHS: The patient had a AP pelvis and 2 views of the left hip on 10/21/2022, taken at [facility]. The images were reviewed by me today, and discussed with the patient. The images revealed: left hip severe osteoarthritis with subchondral sclerosis and osteophyte formation. She has superior and lateral subluxation of the femoral head. She also has a LLD     IMPRESSION:  Jordan [name] is a 67 y.o. female with:  LEFT hip pain: osteoarthritis.   Left leg length discrepancy    PLAN:   Jordan [name] and I had a lengthy discussion regarding her pain. The patient's imaging was reviewed, and the diagnosis explaining the etiology of her pain was dicussed.  Patient would like to continue with conservative treatment.   She will consider PRP  We discussed surgery and rehab at length. She is not interested in surgery at this time given her schedule and work commitments    The patient's questions were answered, and she stated thorough understanding. The patient was advised to contact us if she has any comments, questions, or concerns.    NEXT APPOINTMENT: The patient will follow up in 3 months.      Enid Skeens, M.D.  Orthopedic Surgery - Board Certified  Hip and Knee Replacement and Reconstruction

## 2022-10-22 LAB — CUP PACEART REMOTE DEVICE CHECK
Date Time Interrogation Session: 20240924230633
Implantable Pulse Generator Implant Date: 20220328

## 2022-10-27 ENCOUNTER — Ambulatory Visit (INDEPENDENT_AMBULATORY_CARE_PROVIDER_SITE_OTHER): Payer: Medicare HMO

## 2022-10-27 DIAGNOSIS — I639 Cerebral infarction, unspecified: Secondary | ICD-10-CM

## 2022-11-10 NOTE — Progress Notes (Signed)
[facility] Summary Report / Loop Recorder 

## 2022-11-12 ENCOUNTER — Other Ambulatory Visit: Payer: Self-pay | Admitting: Internal Medicine

## 2022-11-12 DIAGNOSIS — Z1231 Encounter for screening mammogram for malignant neoplasm of breast: Secondary | ICD-10-CM

## 2022-11-13 ENCOUNTER — Telehealth: Payer: MEDICARE

## 2022-11-17 ENCOUNTER — Ambulatory Visit: Payer: BLUE CROSS/BLUE SHIELD | Attending: Student in an Organized Health Care Education/Training Program

## 2022-11-18 ENCOUNTER — Telehealth: Payer: BLUE CROSS/BLUE SHIELD | Attending: Student in an Organized Health Care Education/Training Program

## 2022-11-18 DIAGNOSIS — M1612 Unilateral primary osteoarthritis, left hip: Secondary | ICD-10-CM

## 2022-11-18 DIAGNOSIS — M47816 Spondylosis without myelopathy or radiculopathy, lumbar region: Secondary | ICD-10-CM

## 2022-11-18 DIAGNOSIS — M25552 Pain in left hip: Secondary | ICD-10-CM

## 2022-11-18 MED ORDER — BACLOFEN 10 MG PO TABS
10 mg | ORAL_TABLET | Freq: Every day | ORAL | 0 refills | Status: AC | PRN
Start: 2022-11-18 — End: ?

## 2022-11-18 MED ORDER — PREGABALIN 100 MG PO CAPS
100 mg | ORAL_CAPSULE | Freq: Three times a day (TID) | ORAL | 1 refills | Status: AC
Start: 2022-11-18 — End: ?

## 2022-11-18 NOTE — Progress Notes
Date:  11/18/2022    Name:  HELIANA [name]  MRN#:  [id number]  DOB:  [birth year]  Age:  67 y.o.      [name], M.D.  ANESTHESIOLOGY AND INTERVENTIONAL PAIN MANAGEMENT     Interval History:   Telemedicine disclaimer: The virtual visitation was conducted remotely using Epic. The physical examination findings below are obtained from the previous visit.   Patient reports that she recently saw Dr. 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 She is also planning to see Dr. Charyl Dancer and will be getting a third opinion from an FAI specialist at the end of the month. Shundrika continues to take tramadol, Lyrica, diclofenac, and baclofen for her pain management. However, she experiences significant nighttime pain, which often wakes her up. The pain is exacerbated by going up and down the stairs, in addition to sitting from standing and prolonged ambulation, with a current NRS of 5-6/10.      Injection History:  06/30/22: Bilateral L4/5 TFESI with Dr Jacqulyn Cane    PMHx:  No past medical history on file.    Physical Exam:    Pulmonary: Clear to auscultation bilaterally, no wheezing/crackles  Cardiac: Regular rate and rhythm, no murmurs  Abdomen: soft, nontender, nondistended  Lymph nodes: no enlarged lymph nodes in bilaterally axillae  Skin: Inspection of the head and neck, trunk and extremities is normal.  Extremity: Examination of the bilateral elbows, wrists, knees and ankles reveals no crepitus or swelling. Joints are stable and range of motion is normal.  Neurologic: Cranial nerves II-XII grossly intact.  Motor tone and sensation grossly intact in all four extremities.  Deep tendon reflexes are 2+/4 at the bilateral biceps, triceps, brachioradialis, patellar and Achilles tendons. Motor strength is 5/5 at the bilateral upper and lower extremity flexors and extensors. Sensation is grossly intact in bilateral upper and lower extremities.Left hip flexion 4-5    Musculoskeletal: Gait and station are normal. Inspection of the lumbar spine reveals no scoliosis. No pain with palpation of the thoracic facets and thoracic intervertebral spaces.  Lumbar facet loading is positive.  Sacroiliac joint tenderness is negative. Patrick?s and Gaenslen?s tests are negative. No pain with palpation of the bilateral trochanteric bursae. No pain with palpation of the bilateral piriformis muscles.  No pain with internal and external rotation of bilateral hips.  Straight leg raising is positive at 70 degrees.  Range of motion of the lumbar spine is 90 degrees in anterior flexion without pain, 30 degrees in extension with pain, 30 degrees of left lateral rotation without pain, 30 degrees of right lateral rotation without pain. There are some palpable trigger points in the muscles of the low back.     Head/Neck: Cervical facet load testing is negative. Range of motion of the cervical spine is 40 degrees in anterior flexion without pain, 60 degrees in extension without pain, 80 degrees of left lateral rotation without pain, 80 degrees of right lateral rotation without pain.  The cervical spine is stable. There are no trigger points in the muscles of the head and neck. Strength and tone are normal.  Spurling's test is negative.     Imaging:  MRI L Spine 02/2022        Diagnosis:     ICD-10-CM    1. Left hip pain  M25.552       2. Arthritis of left hip  M16.12       3. Lumbar spondylosis  M47.816  Assessment/Plan:    Jordan [name] is a 67 y.o. female with a past medical history of fibromyalgia since 2016 affecting her low back and bilateral hips. Patient reports she has had conservative treatment for her fibromyalgia such as medications consisting of diclofenac, tramadol and lyrica. Patient reports falling backwards injuring her left hip, knee and back in February 23, 2022.  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 The history, physical, and imaging all correlate with lumbar radiculopathy and spondylosis. She is 6 weeks s/p lumbar l4/5 TFESI (with Outside provider) with 50% relief in symptoms, however with a hip MRI demonstrating a stress fracture which is slowly healing. We will refill her pregabalin 100mg  BID and has switched her naproxen to Diclofenac.     Patient Jordan [name] reports significant nighttime pain despite current medications, including tramadol, Lyrica, diclofenac, and baclofen. She recently saw Dr. Dyane Dustman, who recommended a hip replacement for her femoral acetabular impingement syndrome, and has upcoming consultations with Dr. Murrell Converse and an FAI specialist. The plan includes increasing Lyrica by 10 tablets per month, continuing the current medication regimen, and allowing an extra baclofen at night if needed. Follow-up is scheduled in one month to reassess pain management and review specialist recommendations.     I counseled the patient on the risks of opioid use and misuse, including tolerance, hyperalgesia, withdrawal, overdose, and death. The patient understands the benefit and risks involved.  OPIOID DISCUSSION  [x]  CURES checked  [x]  Discussed multimodal options including but not limited to acupuncture, chiropractor care, massage, heat, ice, stretching, physical therapy, TENs, multimodal medications, meditation, biofeedback.  [x]  Offered weaning strategies and support  [x]  Opioid contract signed and on file  [x]  The 5 A's of Opioids were reviewed (Affect= normal, Aberrance = no gross signs of medication abuse or diversion, Activity = no worsening in activity level, Adverse = no significant side effects, Analgesia = not experiencing a worsening in pain)  [x]  Risks of addiction, hyperalgesia, fatal overdose, decreased energy levels, endocrine abnormalities, nausea, constipation, ileus, and immune supression discussed with patient.  [x]  Naloxone medication offered  [x]  Explained to patient increased risk of adverse events including death in setting of alcohol use, marijuana use, benzodiazeapine use, barbituates, anti-epileptic drugs, muscle relaxants, sleep medications, and over-the-counter medications. Patient voiced understanding.   [x]  Goals for pain and function reviewed with patient.  [x]  Counseled patient on safe use of opioids.  [x]  Patient understands goal is to wean opioid medications over time, if possible.  [x]  I recommended the patient avoid use of sedatives, benzodiazepines, alcohol, marijuana and other illicit and other over-the-medications that may depress CNS and respiratory function.       Planned Interventions:  None    Medications:  Refill Tramadol 50 mg BID PRN (prescribed by Dr Derenda Fennel)  Refill pregabalin 100 mg increase to TID RX 11/18/22  Robaxin 500mg  BID  Refill Naproxen 500mg      Imaging:  Reviewed     Referrals:  Physical Therapy [facility]   Dr Dyane Dustman     Follow up:  4 weeks         Thank you for allowing me to participate in the care of your patient. Please do not hesitate to reach out to me with any questions or concerns.     Sincerely,      [name], M.D.  Anesthesiology and Interventional Pain Management

## 2022-11-24 ENCOUNTER — Ambulatory Visit: Payer: MEDICARE | Attending: Student in an Organized Health Care Education/Training Program

## 2022-11-25 LAB — CUP PACEART REMOTE DEVICE CHECK
Date Time Interrogation Session: 20241027230344
Implantable Pulse Generator Implant Date: 20220328

## 2022-12-01 ENCOUNTER — Ambulatory Visit (INDEPENDENT_AMBULATORY_CARE_PROVIDER_SITE_OTHER): Payer: Medicare HMO

## 2022-12-01 ENCOUNTER — Telehealth: Payer: MEDICARE | Attending: Student in an Organized Health Care Education/Training Program

## 2022-12-01 DIAGNOSIS — I639 Cerebral infarction, unspecified: Secondary | ICD-10-CM | POA: Diagnosis not present

## 2022-12-01 DIAGNOSIS — M47816 Spondylosis without myelopathy or radiculopathy, lumbar region: Secondary | ICD-10-CM

## 2022-12-01 DIAGNOSIS — M1612 Unilateral primary osteoarthritis, left hip: Secondary | ICD-10-CM

## 2022-12-01 DIAGNOSIS — M25552 Pain in left hip: Secondary | ICD-10-CM

## 2022-12-02 MED ORDER — TRAMADOL HCL 50 MG PO TABS
50 mg | ORAL_TABLET | Freq: Three times a day (TID) | ORAL | 0 refills | Status: AC | PRN
Start: 2022-12-02 — End: ?

## 2022-12-02 NOTE — Progress Notes
Date:  12/01/2022    Name:  Jordan [name]  MRN#:  [id number]  DOB:  Jan 30, 1955  Age:  67 y.o.      [name], M.D.  ANESTHESIOLOGY AND INTERVENTIONAL PAIN MANAGEMENT     Interval History:   Telemedicine disclaimer: The virtual visitation was conducted remotely using Epic. The physical examination findings below are obtained from the previous visit.   The patient, Jordan [name], presents with significant left hip pain. [name] reports having consulted with Dr. Murrell Converse, who has determined [name] is a candidate for surgery, which is scheduled for January 29th. Jordan [name] has increased her tramadol intake to three times daily due to the severity of her hip pain. [name] expresses a desire for an updated MRI of her hip and wishes to start physical therapy.    Injection History:  06/30/22: Bilateral L4/5 TFESI with Dr Jacqulyn Cane    PMHx:  No past medical history on file.    Physical Exam:    Pulmonary: Clear to auscultation bilaterally, no wheezing/crackles  Cardiac: Regular rate and rhythm, no murmurs  Abdomen: soft, nontender, nondistended  Lymph nodes: no enlarged lymph nodes in bilaterally axillae  Skin: Inspection of the head and neck, trunk and extremities is normal.  Extremity: Examination of the bilateral elbows, wrists, knees and ankles reveals no crepitus or swelling. Joints are stable and range of motion is normal.  Neurologic: Cranial nerves II-XII grossly intact.  Motor tone and sensation grossly intact in all four extremities.  Deep tendon reflexes are 2+/4 at the bilateral biceps, triceps, brachioradialis, patellar and Achilles tendons. Motor strength is 5/5 at the bilateral upper and lower extremity flexors and extensors. Sensation is grossly intact in bilateral upper and lower extremities.Left hip flexion 4-5    Musculoskeletal: Gait and station are normal. Inspection of the lumbar spine reveals no scoliosis. No pain with palpation of the thoracic facets and thoracic intervertebral spaces.  Lumbar facet loading is positive.  Sacroiliac joint tenderness is negative. Patrick?s and Gaenslen?s tests are negative. No pain with palpation of the bilateral trochanteric bursae. No pain with palpation of the bilateral piriformis muscles.  No pain with internal and external rotation of bilateral hips.  Straight leg raising is positive at 70 degrees.  Range of motion of the lumbar spine is 90 degrees in anterior flexion without pain, 30 degrees in extension with pain, 30 degrees of left lateral rotation without pain, 30 degrees of right lateral rotation without pain. There are some palpable trigger points in the muscles of the low back.     Head/Neck: Cervical facet load testing is negative. Range of motion of the cervical spine is 40 degrees in anterior flexion without pain, 60 degrees in extension without pain, 80 degrees of left lateral rotation without pain, 80 degrees of right lateral rotation without pain.  The cervical spine is stable. There are no trigger points in the muscles of the head and neck. Strength and tone are normal.  Spurling's test is negative.     Imaging:  MRI L Spine 02/2022        Diagnosis:     ICD-10-CM    1. Left hip pain  M25.552       2. Lumbar spondylosis  M47.816       3. Arthritis of left hip  M16.12                 Assessment/Plan:    Jordan [name] is a 67 y.o. female with a past  medical history of fibromyalgia since 2016 affecting her low back and bilateral hips. Patient reports [name] has had conservative treatment for her fibromyalgia such as medications consisting of diclofenac, tramadol and lyrica. Patient reports falling backwards injuring her left hip, knee and back in February 23, 2022.  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 The history, physical, and imaging all correlate with lumbar radiculopathy and spondylosis. [name] is 6 weeks s/p lumbar l4/5 TFESI (with Outside provider) with 50% relief in symptoms, however with a hip MRI demonstrating a stress fracture which is slowly healing.     Jordan [name] presents with significant left hip pain and reports increasing her tramadol dosage to three times a day. [name] is scheduled for surgery with Dr. Charyl Dancer on January 29th. An updated MRI of the left hip has been ordered, and [name] has been referred to [facility] in [facility]. A follow-up appointment is scheduled in one month to assess pain management and review MRI results.    I. Left hip pain     - Patient reports increased pain in the left hip and is scheduled for surgery on January 29 with Dr. Charyl Dancer     - Plan: Increase tramadol to 50 mg TID for pain management     - Plan: Order an updated MRI of the left hip and provide the patient with the prescription to choose a facility     - Plan: Refer the patient to [facility] in [facility] for physical therapy sessions    II. Follow-up     - Plan: Schedule a follow-up appointment in one month to assess the patient's pain management and review the MRI results    ------------------------------------------------------------------------------------------------------------------------------------     I counseled the patient on the risks of opioid use and misuse, including tolerance, hyperalgesia, withdrawal, overdose, and death. The patient understands the benefit and risks involved.  OPIOID DISCUSSION  [x]  CURES checked  [x]  Discussed multimodal options including but not limited to acupuncture, chiropractor care, massage, heat, ice, stretching, physical therapy, TENs, multimodal medications, meditation, biofeedback.  [x]  Offered weaning strategies and support  [x]  Opioid contract signed and on file  [x]  The 5 A's of Opioids were reviewed (Affect= normal, Aberrance = no gross signs of medication abuse or diversion, Activity = no worsening in activity level, Adverse = no significant side effects, Analgesia = not experiencing a worsening in pain)  [x]  Risks of addiction, hyperalgesia, fatal overdose, decreased energy levels, endocrine abnormalities, nausea, constipation, ileus, and immune supression discussed with patient.  [x]  Naloxone medication offered  [x]  Explained to patient increased risk of adverse events including death in setting of alcohol use, marijuana use, benzodiazeapine use, barbituates, anti-epileptic drugs, muscle relaxants, sleep medications, and over-the-counter medications. Patient voiced understanding.   [x]  Goals for pain and function reviewed with patient.  [x]  Counseled patient on safe use of opioids.  [x]  Patient understands goal is to wean opioid medications over time, if possible.  [x]  I recommended the patient avoid use of sedatives, benzodiazepines, alcohol, marijuana and other illicit and other over-the-medications that may depress CNS and respiratory function.       Planned Interventions:  None    Medications:  Refill Tramadol 50 mg BID PRN (prescribed by Dr Derenda Fennel)  Refill pregabalin 100 mg increase to TID RX 11/18/22  Robaxin 500mg  BID  Refill Naproxen 500mg      Imaging:  Reviewed  Referrals:  [facility]   Dr Dyane Dustman     Follow up:  4 weeks         Thank you for allowing me to participate in the care of your patient. Please do not hesitate to reach out to me with any questions or concerns.     Sincerely,      [name], M.D.  Anesthesiology and Interventional Pain Management

## 2022-12-20 NOTE — Progress Notes (Unsigned)
Cardiology Office Note:    Date:  12/22/2022   ID:  Tracey [name], DOB Jun 17, 1955, MRN [id number]  PCP:  Danella Penton, MD    [facility]  Cardiologist:  Ellana Kawa  Advanced Practice Provider:  No care team member to display Electrophysiologist:  Lalla Brothers    :[id number]}   Referring MD: Danella Penton, MD   No chief complaint on file.   May 22, 2020   Tracey [name] is a 67 y.o. female with a hx of CVA, CAD ( s/p anterior MI with stent placement in 2010) Was a smoker at the time ,   She was admitted in March with a CVA. 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 Lalla Brothers She has moderate ( grade III) layered plaque in the transverse and descending aorta  She had mildly reduced LV function with apical akinesis.  Still speech is altered.  Going to speech therapy,  Some difficult with reading and writing . Still has some headaches   She is here to transfer from her cardiologist  at [facility]  No CP , no dyspnea She has some continued discomfort  from her implantable loop recorder   Has been found to have PAD   July 26, 2021: Tracey [name] is seen today for follow-up of her coronary artery disease.  (status post anterior wall MI in 2010 with stent placement to the LAD )She has a history of a CVA.  Transesophageal echo revealed an aneurysmal atrial septum with a PFO.  She has had an implantable loop placed by Dr. Lalla Brothers.  Doing well.  Had a loop recorder for a stroke. No further episodes of TIA or stroke   Has occasional panic attacks Wants to cut back on her ASA ( currently on ASA 162- I told her I would defer to neuro on this issue  The cost of interrogation of the loop recorder costs $200 / month.  She has a history of anteroapical myocardial infarction.  She has an area of dyskinesis in the anterior apex.  Recent echo in May did not show a thrombus but I am concerned that if she has mural thrombus  in that dyskinetic area that this may be an etiology for her stroke.  We will plan on getting an MRI to evaluate her LV and to look for thrombus in the apex.  If she does have thrombus I think she should be started on DOAC Or Coumadin and DC the ASA and plavix    January 07, 2022 Tracey [name] is seen today for follow-up of her coronary artery disease, past history of anteroapical myocardial infarction.  She had an area of dyskinesis in the anterior apex and she has had a subsequent stroke.  I suspect that she had a mural thrombus in the dyskinetic area.  Cardiac MRI reveals anterior septal and mid anterior hypokinesis.  There is apical septal akinesis with an ejection fraction of 48%.  No thrombus was seen.  Plavix was continued per the stroke team.  Feeling well   Lipid panel at [facility] in the [facility] 10/02/21 reveals an LDL of 40.  HDL is 54.  Total cholesterol is 117, triglyceride level is 113  (rosuvastatin 40 mg a day plus Zetia 10 mg a day  She is having muscle aches on the rosuvastatin 40 but will tolerate it for now She will be on Medicare in March, 2024.  I encouraged her  to consider starting a PCSK9 inhibitor at that time.   Before she signs up for her next insurance plan she needs to make sure that it will cover routine doctor visits.  She will also need to have coverage for Repatha and or Praluent  Walking regularly   Is being followed by EP , last seen by Otilio Saber, AP  I would like to have her consider implantable loop recorder when she is Medicare eligible.   Jun 16, 2022 Tracey [name] is a 67 yo female with hx of Ant. Apical MI  with evidence of an apical aneurism  Has some palpitations Had a PVC on her ECG today   Having some left leg swelling after long bus ride Has had thrombophlebitis in that leg in the past  We discussed having her see [facility].   Has seen [facility]     Lipid levels from the [facility] reveals Total cholesterol is  126 Triglyceride levels 121 HDL is 63.7 LDL is 38 Complete metabolic profile looks within normal limits. TSH is 2.433   Nov. 25, 2024 Tracey [name] is seen for follow up of her ant. Apical MI ( Ant. MI 2010, was seen at [facility] ) with evidence of apical aneurism Has had a stroke  Has an implantable loop recorder following her stroke    Has had bronchitis  Still exercising ,  has some leg issues  Her speech is better, had dysarthria after her stroke 2 year ago   We discussed adding some CHF meds (  She has no signs or symptoms of volume overload or CHF Her EF of 48% is most certalinly due to her ant. MI in 2010.     Past Medical History:  Diagnosis Date   Anxiety    Bronchitis    CAD (coronary artery disease)    a. 04/2008: cariac arrest with prox LAD occ s/p stenting, arctic sun. b. 11/2008: restenosis s/p repeat stenting. c. 01/2011: abnormal stress test but cath reportedly OK.   Dysplastic nevus 11/04/2007   Ant. chest. Slight to moderate atypia, extends to one edge.    Dysplastic nevus 01/03/2009   Left breast. Minimal atypia, close to margin.   Hypothyroid    Myocardial infarction [facility]) 2010   stents and cardiac rehab    Stroke [facility])     Past Surgical History:  Procedure Laterality Date   ANGIOPLASTY  2010   2 stents (femoral)    BUBBLE STUDY  04/23/2020   Procedure: BUBBLE STUDY;  Surgeon: Sande Rives, MD;  Location: [facility] ENDOSCOPY;  Service: Cardiovascular;;   COLONOSCOPY WITH PROPOFOL N/A 07/09/2022   Procedure: COLONOSCOPY WITH PROPOFOL;  Surgeon: Toledo, Boykin Nearing, MD;  Location: [facility] ENDOSCOPY;  Service: Gastroenterology;  Laterality: N/A;   IR CT HEAD LTD  04/18/2020   IR PERCUTANEOUS ART THROMBECTOMY/INFUSION INTRACRANIAL INC DIAG ANGIO  04/18/2020   LOOP RECORDER INSERTION N/A 04/23/2020   Procedure: LOOP RECORDER INSERTION;  Surgeon: Lanier Prude, MD;  Location: [facility] INVASIVE CV LAB;  Service: Cardiovascular;  Laterality: N/A;   RADIOLOGY WITH ANESTHESIA N/A  04/18/2020   Procedure: IR WITH ANESTHESIA;  Surgeon: Radiologist, Medication, MD;  Location: [facility] OR;  Service: Radiology;  Laterality: N/A;   TEE WITHOUT CARDIOVERSION N/A 04/23/2020   Procedure: TRANSESOPHAGEAL ECHOCARDIOGRAM (TEE);  Surgeon: Sande Rives, MD;  Location: [facility] ENDOSCOPY;  Service: Cardiovascular;  Laterality: N/A;   TUBAL LIGATION      Current Medications: Current Meds  Medication Sig   Cholecalciferol 50  MCG (2000 UT) TABS Take 2,000 Units by mouth daily.   clopidogrel (PLAVIX) 75 MG tablet TAKE 1 TABLET BY MOUTH DAILY   cyanocobalamin (,VITAMIN B-12,) 1000 MCG/ML injection Inject 1,000 mcg into the muscle every 30 (thirty) days.   escitalopram (LEXAPRO) 10 MG tablet Take by mouth.   ezetimibe (ZETIA) 10 MG tablet TAKE 1 TABLET BY MOUTH DAILY   levothyroxine (SYNTHROID) 75 MCG tablet Take 75 mcg by mouth daily.   pantoprazole (PROTONIX) 40 MG tablet Take 40 mg by mouth daily.   spironolactone (ALDACTONE) 25 MG tablet Take 1 tablet (25 mg total) by mouth daily.   [DISCONTINUED] metoprolol succinate (TOPROL-XL) 25 MG 24 hr tablet Take 25 mg by mouth daily.   [DISCONTINUED] nitroGLYCERIN (NITROSTAT) 0.4 MG SL tablet Place 1 tablet (0.4 mg total) under the tongue every 5 (five) minutes as needed for chest pain.   [DISCONTINUED] ramipril (ALTACE) 5 MG capsule Take 5 mg by mouth daily.   [DISCONTINUED] rosuvastatin (CRESTOR) 40 MG tablet Take 1 tablet (40 mg total) by mouth daily.     Allergies:   Sulfa antibiotics   Social History   Socioeconomic History   Marital status: Married    Spouse name: Not on file   Number of children: Not on file   Years of education: Not on file   Highest education level: Not on file  Occupational History   Not on file  Tobacco Use   Smoking status: Former    Current packs/day: 0.00    Average packs/day: 1.5 packs/day for 35.1 years (52.6 ttl pk-yrs)    Types: Cigarettes    Start date: 03/22/1973    Quit date: 04/27/2008    Years  since quitting: 14.6   Smokeless tobacco: Never  Vaping Use   Vaping status: Never Used  Substance and Sexual Activity   Alcohol use: No    Alcohol/week: 0.0 standard drinks of alcohol   Drug use: Never   Sexual activity: Yes    Birth control/protection: Post-menopausal  Other Topics Concern   Not on file  Social History Narrative   Not on file   Social Determinants of Health   Financial Resource Strain: Low Risk  (10/30/2022)   Received from [facility]   Overall Financial Resource Strain (CARDIA)    Difficulty of Paying Living Expenses: Not hard at all  Food Insecurity: No Food Insecurity (10/30/2022)   Received from [facility]   Hunger Vital Sign    Worried About Running Out of Food in the Last Year: Never true    Ran Out of Food in the Last Year: Never true  Transportation Needs: No Transportation Needs (10/30/2022)   Received from [facility] - Transportation    In the past 12 months, has lack of transportation kept you from medical appointments or from getting medications?: No    Lack of Transportation (Non-Medical): No  Physical Activity: Not on file  Stress: Not on file  Social Connections: Not on file     Family History: The patient's family history includes Breast cancer in an other family member; Diabetes in her maternal grandfather and mother; Hypertension in her mother; Stroke in her maternal grandfather and maternal grandmother.  ROS:   Please see the history of present illness.     All other systems reviewed and are negative.  EKGs/Labs/Other Studies Reviewed:    The following studies were reviewed today:   EKG:  Recent Labs: No results found for requested labs within last 365 days.  Recent Lipid Panel    Component Value Date/Time   CHOL 160 06/13/2021 0800   TRIG 98 06/13/2021 0800   HDL 56 06/13/2021 0800   CHOLHDL 2.9 06/13/2021 0800   VLDL 20 06/13/2021 0800   LDLCALC  84 06/13/2021 0800     Risk Assessment/Calculations:       Physical Exam:    Physical Exam: Blood pressure 138/80, pulse 74, height 5\' 5"  (1.651 m), weight 217 lb 3.2 oz (98.5 kg), SpO2 97%.       GEN:  Well nourished, well developed in no acute distress HEENT: Normal NECK: No JVD; No carotid bruits LYMPHATICS: No lymphadenopathy CARDIAC: RRR , no murmurs, rubs, gallops RESPIRATORY:  Clear to auscultation without rales, wheezing or rhonchi  ABDOMEN: Soft, non-tender, non-distended MUSCULOSKELETAL:  No edema; No deformity  SKIN: Warm and dry NEUROLOGIC:  Alert and oriented x 3   ASSESSMENT:    1. Coronary artery disease of native artery of native heart with stable angina pectoris (HCC)   2. HFrEF (heart failure with reduced ejection fraction) (HCC)   3. Medication management       PLAN:       Coronary artery disease:    She had a massive anterior wall myocardial infarction in 2010.  She has not had any angina since that time.    2.  History of CVA: She has an implantable loop recorder.  See history of apical aneurysm in the past.  MRI of the heart in September, 2023 reveals apical septal akinesis but no evidence of aneurysm.  Continue Plavix.   3.  Hyperlipidemia:    Lipid levels are well-controlled.  4.  Left leg thrombophlebitis: She is found to have venous insufficiency.    5.   Heart failure with reduced EF: She has an EF of 48%.     She does have some mild shortness of breath with exertion.   Will add spironolactone.Will get a basic metabolic profile in 2 to 3 weeks.  I will see her in 3 months for follow-up visit.  I suspect that her reduced ejection fraction is due to her anterior myocardial infarction.  I do not necessarily think that she will benefit more by the addition of Entresto.  Continue ramipril.     Medication Adjustments/Labs and Tests Ordered: Current medicines are reviewed at length with the patient today.  Concerns regarding medicines are  outlined above.  Orders Placed This Encounter  Procedures   Basic metabolic panel   Meds ordered this encounter  Medications   metoprolol succinate (TOPROL-XL) 25 MG 24 hr tablet    Sig: Take 1 tablet (25 mg total) by mouth daily.    Dispense:  90 tablet    Refill:  3   nitroGLYCERIN (NITROSTAT) 0.4 MG SL tablet    Sig: Dissolve 1 tablet under the tongue every 5 minutes as needed for chest pain. Max of 3 doses, then 911.    Dispense:  25 tablet    Refill:  6   rosuvastatin (CRESTOR) 40 MG tablet    Sig: Take 1 tablet (40 mg total) by mouth daily.    Dispense:  90 tablet    Refill:  3   ramipril (ALTACE) 5 MG capsule    Sig: Take 1 capsule (5 mg total) by mouth daily.    Dispense:  90 capsule    Refill:  3   spironolactone (ALDACTONE) 25 MG tablet  Sig: Take 1 tablet (25 mg total) by mouth daily.    Dispense:  90 tablet    Refill:  3    Patient Instructions  Medication Instructions:  START Spironolactone 25mg  daily *If you need a refill on your cardiac medications before your next appointment, please call your pharmacy*  Lab Work: BMET in 2-3 weeks If you have labs (blood work) drawn today and your tests are completely normal, you will receive your results only by: MyChart Message (if you have MyChart) OR A paper copy in the mail If you have any lab test that is abnormal or we need to change your treatment, we will call you to review the results.  Follow-Up: At [facility], you and your health needs are our priority.  As part of our continuing mission to provide you with exceptional heart care, we have created designated Provider Care Teams.  These Care Teams include your primary Cardiologist (physician) and Advanced Practice Providers (APPs -  Physician Assistants and Nurse Practitioners) who all work together to provide you with the care you need, when you need it.  Your next appointment:   3 month(s)  Provider:   Kristeen Miss, MD         Signed, Kristeen Miss, MD  12/22/2022 9:48 AM    [facility]

## 2022-12-22 ENCOUNTER — Ambulatory Visit: Payer: Medicare HMO | Attending: Cardiovascular Disease | Admitting: Cardiovascular Disease

## 2022-12-22 ENCOUNTER — Encounter: Payer: Self-pay | Admitting: Cardiovascular Disease

## 2022-12-22 VITALS — BP 138/80 | HR 74 | Ht 65.0 in | Wt 217.2 lb

## 2022-12-22 DIAGNOSIS — I25118 Atherosclerotic heart disease of native coronary artery with other forms of angina pectoris: Secondary | ICD-10-CM

## 2022-12-22 DIAGNOSIS — Z79899 Other long term (current) drug therapy: Secondary | ICD-10-CM | POA: Diagnosis not present

## 2022-12-22 DIAGNOSIS — I5020 Unspecified systolic (congestive) heart failure: Secondary | ICD-10-CM | POA: Diagnosis not present

## 2022-12-22 MED ORDER — NITROGLYCERIN 0.4 MG SL SUBL: SUBLINGUAL_TABLET | SUBLINGUAL | 6 refills | Status: DC

## 2022-12-22 MED ORDER — METOPROLOL SUCCINATE ER 25 MG PO TB24: 25.0000 mg | ORAL_TABLET | Freq: Every day | ORAL | 3 refills | Status: AC

## 2022-12-22 MED ORDER — ROSUVASTATIN CALCIUM 40 MG PO TABS: 40.0000 mg | ORAL_TABLET | Freq: Every day | ORAL | 3 refills | Status: AC

## 2022-12-22 MED ORDER — SPIRONOLACTONE 25 MG PO TABS: 25.0000 mg | ORAL_TABLET | Freq: Every day | ORAL | 3 refills | Status: DC

## 2022-12-22 MED ORDER — RAMIPRIL 5 MG PO CAPS: 5.0000 mg | ORAL_CAPSULE | Freq: Every day | ORAL | 3 refills | Status: AC

## 2022-12-22 NOTE — Patient Instructions (Signed)
Medication Instructions:  START Spironolactone 25mg  daily *If you need a refill on your cardiac medications before your next appointment, please call your pharmacy*  Lab Work: BMET in 2-3 weeks If you have labs (blood work) drawn today and your tests are completely normal, you will receive your results only by: MyChart Message (if you have MyChart) OR A paper copy in the mail If you have any lab test that is abnormal or we need to change your treatment, we will call you to review the results.  Follow-Up: At [facility], you and your health needs are our priority.  As part of our continuing mission to provide you with exceptional heart care, we have created designated Provider Care Teams.  These Care Teams include your primary Cardiologist (physician) and Advanced Practice Providers (APPs -  Physician Assistants and Nurse Practitioners) who all work together to provide you with the care you need, when you need it.  Your next appointment:   3 month(s)  Provider:   Kristeen Miss, MD

## 2022-12-22 NOTE — Progress Notes (Signed)
[facility] Summary Report / Loop Recorder 

## 2022-12-26 MED ORDER — BACLOFEN 10 MG PO TABS
10 mg | ORAL_TABLET | Freq: Every day | ORAL | 0 refills | PRN
Start: 2022-12-26 — End: ?

## 2022-12-30 ENCOUNTER — Ambulatory Visit
Admission: RE | Admit: 2022-12-30 | Discharge: 2022-12-30 | Disposition: A | Discharge status: Home / Self Care | Payer: Medicare HMO | Source: Ambulatory Visit | Attending: Internal Medicine | Admitting: Internal Medicine

## 2022-12-30 DIAGNOSIS — Z1231 Encounter for screening mammogram for malignant neoplasm of breast: Secondary | ICD-10-CM | POA: Diagnosis present

## 2022-12-30 MED ORDER — BACLOFEN 10 MG PO TABS
10 mg | ORAL_TABLET | Freq: Every day | ORAL | 0 refills | Status: AC | PRN
Start: 2022-12-30 — End: ?

## 2023-01-05 ENCOUNTER — Ambulatory Visit (INDEPENDENT_AMBULATORY_CARE_PROVIDER_SITE_OTHER): Payer: Medicare HMO

## 2023-01-05 DIAGNOSIS — I639 Cerebral infarction, unspecified: Secondary | ICD-10-CM

## 2023-01-05 LAB — CUP PACEART REMOTE DEVICE CHECK
Date Time Interrogation Session: 20241208230449
Implantable Pulse Generator Implant Date: 20220328

## 2023-01-07 MED ORDER — TRAMADOL HCL 50 MG PO TABS
50 mg | ORAL_TABLET | Freq: Three times a day (TID) | ORAL | PRN
Start: 2023-01-07 — End: ?

## 2023-01-08 ENCOUNTER — Telehealth: Payer: MEDICARE | Attending: Student in an Organized Health Care Education/Training Program

## 2023-01-08 DIAGNOSIS — M47816 Spondylosis without myelopathy or radiculopathy, lumbar region: Secondary | ICD-10-CM

## 2023-01-08 DIAGNOSIS — M1612 Unilateral primary osteoarthritis, left hip: Secondary | ICD-10-CM

## 2023-01-08 DIAGNOSIS — M25552 Pain in left hip: Secondary | ICD-10-CM

## 2023-01-09 MED ORDER — TRAMADOL HCL 50 MG PO TABS
50 mg | ORAL_TABLET | Freq: Three times a day (TID) | ORAL | 0 refills | Status: AC | PRN
Start: 2023-01-09 — End: ?

## 2023-01-09 NOTE — Progress Notes
Date:  01/08/2023    Name:  Jordan [name]  MRN#:  [id number]  DOB:  12-09-1955  Age:  67 y.o.      [name], M.D.  ANESTHESIOLOGY AND INTERVENTIONAL PAIN MANAGEMENT     Interval History:   Telemedicine disclaimer: The virtual visitation was conducted remotely using Epic. The physical examination findings below are obtained from the previous visit.     ***    Injection History:  06/30/22: Bilateral L4/5 TFESI with Dr Jacqulyn Cane    PMHx:  No past medical history on file.    Physical Exam:    Pulmonary: Clear to auscultation bilaterally, no wheezing/crackles  Cardiac: Regular rate and rhythm, no murmurs  Abdomen: soft, nontender, nondistended  Lymph nodes: no enlarged lymph nodes in bilaterally axillae  Skin: Inspection of the head and neck, trunk and extremities is normal.  Extremity: Examination of the bilateral elbows, wrists, knees and ankles reveals no crepitus or swelling. Joints are stable and range of motion is normal.  Neurologic: Cranial nerves II-XII grossly intact.  Motor tone and sensation grossly intact in all four extremities.  Deep tendon reflexes are 2+/4 at the bilateral biceps, triceps, brachioradialis, patellar and Achilles tendons. Motor strength is 5/5 at the bilateral upper and lower extremity flexors and extensors. Sensation is grossly intact in bilateral upper and lower extremities.Left hip flexion 4-5    Musculoskeletal: Gait and station are normal. Inspection of the lumbar spine reveals no scoliosis. No pain with palpation of the thoracic facets and thoracic intervertebral spaces.  Lumbar facet loading is positive.  Sacroiliac joint tenderness is negative. Patrick?s and Gaenslen?s tests are negative. No pain with palpation of the bilateral trochanteric bursae. No pain with palpation of the bilateral piriformis muscles.  No pain with internal and external rotation of bilateral hips.  Straight leg raising is positive at 70 degrees.  Range of motion of the lumbar spine is 90 degrees in anterior flexion without pain, 30 degrees in extension with pain, 30 degrees of left lateral rotation without pain, 30 degrees of right lateral rotation without pain. There are some palpable trigger points in the muscles of the low back.     Head/Neck: Cervical facet load testing is negative. Range of motion of the cervical spine is 40 degrees in anterior flexion without pain, 60 degrees in extension without pain, 80 degrees of left lateral rotation without pain, 80 degrees of right lateral rotation without pain.  The cervical spine is stable. There are no trigger points in the muscles of the head and neck. Strength and tone are normal.  Spurling's test is negative.     Imaging:  MRI L Spine 02/2022        MRI L HIP 2024    MPRESSION:     Severe osteoarthritic change affects the left hip with marked progression in comparison to the prior study including full-thickness chondral loss, osseous remodeling, subchondral cystic change with osteophytic ridging. There is a underlying hip dysplasia   noted with undercoverage of the femoral head. Large left hip joint effusion. Diffuse degenerative labral tearing.     Right hip dysplasia with osteoarthritic change however no acute abnormality of the contralateral hip on the large field-of-view coronal sequence.     Edema within the quadratus femoris as well as the adductor brevis and magnus consistent with mild interstitial muscle strains. Trace amount of fluid along the iliopsoas bursa with a mild interstitial strain    Diagnosis:     ICD-10-CM    1.  Left hip pain  M25.552       2. Lumbar spondylosis  M47.816       3. Arthritis of left hip  M16.12                   Assessment/Plan:    Jordan [name] is a 67 y.o. female with a past medical history of fibromyalgia since 2016 affecting her low back and bilateral hips. Patient reports she has had conservative treatment for her fibromyalgia such as medications consisting of diclofenac, tramadol and lyrica. Patient reports falling backwards injuring her left hip, knee and back in February 23, 2022.  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 The history, physical, and imaging all correlate with lumbar radiculopathy and spondylosis. She is 6 weeks s/p lumbar l4/5 TFESI (with Outside provider) with 50% relief in symptoms, however with a hip MRI demonstrating a stress fracture which is slowly healing.     ***       I counseled the patient on the risks of opioid use and misuse, including tolerance, hyperalgesia, withdrawal, overdose, and death. The patient understands the benefit and risks involved.  OPIOID DISCUSSION  [x]  CURES checked  [x]  Discussed multimodal options including but not limited to acupuncture, chiropractor care, massage, heat, ice, stretching, physical therapy, TENs, multimodal medications, meditation, biofeedback.  [x]  Offered weaning strategies and support  [x]  Opioid contract signed and on file  [x]  The 5 A's of Opioids were reviewed (Affect= normal, [place] = no gross signs of medication abuse or diversion, Activity = no worsening in activity level, Adverse = no significant side effects, Analgesia = not experiencing a worsening in pain)  [x]  Risks of addiction, hyperalgesia, fatal overdose, decreased energy levels, endocrine abnormalities, nausea, constipation, ileus, and immune supression discussed with patient.  [x]  Naloxone medication offered  [x]  Explained to patient increased risk of adverse events including death in setting of alcohol use, marijuana use, benzodiazeapine use, barbituates, anti-epileptic drugs, muscle relaxants, sleep medications, and over-the-counter medications. Patient voiced understanding.   [x]  Goals for pain and function reviewed with patient.  [x]  Counseled patient on safe use of opioids.  [x]  Patient understands goal is to wean opioid medications over time, if possible.  [x]  I recommended the patient avoid use of sedatives, benzodiazepines, alcohol, marijuana and other illicit and other over-the-medications that may depress CNS and respiratory function.     -------------------------------------------------------------------------------------------------------------------------------------------------------------------------------------------------------------------      Planned Interventions:  None    Medications:  Refill Tramadol 50 mg BID PRN 01/08/23  Refill pregabalin 100 mg increase to TID RX 11/18/22  Refill Naproxen 500mg    Baclofen 10 mg TID    Imaging:  Reviewed     Referrals:  Physical Therapy [facility]   Dr Dyane Dustman     Follow up:  4 weeks         Thank you for allowing me to participate in the care of your patient. Please do not hesitate to reach out to me with any questions or concerns.     Sincerely,      [name], M.D.  Anesthesiology and Interventional Pain Management

## 2023-01-25 MED ORDER — BACLOFEN 10 MG PO TABS
10 mg | ORAL_TABLET | Freq: Every day | ORAL | 0 refills | PRN
Start: 2023-01-25 — End: ?

## 2023-01-26 MED ORDER — BACLOFEN 10 MG PO TABS
10 mg | ORAL_TABLET | Freq: Every day | ORAL | 0 refills | Status: AC | PRN
Start: 2023-01-26 — End: 2023-02-10

## 2023-02-02 ENCOUNTER — Ambulatory Visit: Payer: MEDICARE

## 2023-02-09 ENCOUNTER — Ambulatory Visit (INDEPENDENT_AMBULATORY_CARE_PROVIDER_SITE_OTHER): Payer: Medicare HMO

## 2023-02-09 ENCOUNTER — Ambulatory Visit: Payer: MEDICARE | Attending: Student in an Organized Health Care Education/Training Program

## 2023-02-09 DIAGNOSIS — I639 Cerebral infarction, unspecified: Secondary | ICD-10-CM | POA: Diagnosis not present

## 2023-02-09 DIAGNOSIS — M25552 Pain in left hip: Secondary | ICD-10-CM

## 2023-02-09 DIAGNOSIS — M1612 Unilateral primary osteoarthritis, left hip: Secondary | ICD-10-CM

## 2023-02-09 DIAGNOSIS — M7918 Myalgia, other site: Secondary | ICD-10-CM

## 2023-02-09 DIAGNOSIS — M47816 Spondylosis without myelopathy or radiculopathy, lumbar region: Secondary | ICD-10-CM

## 2023-02-09 DIAGNOSIS — M5416 Radiculopathy, lumbar region: Secondary | ICD-10-CM

## 2023-02-09 LAB — CUP PACEART REMOTE DEVICE CHECK
Date Time Interrogation Session: 20250112230709
Implantable Pulse Generator Implant Date: 20220328
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
## 2023-02-10 MED ORDER — BACLOFEN 10 MG PO TABS
10 mg | ORAL_TABLET | Freq: Every day | ORAL | 0 refills | Status: AC | PRN
Start: 2023-02-10 — End: ?

## 2023-02-10 MED ORDER — TRAMADOL HCL 50 MG PO TABS
50 mg | ORAL_TABLET | Freq: Four times a day (QID) | ORAL | 0 refills | Status: AC | PRN
Start: 2023-02-10 — End: ?

## 2023-03-11 ENCOUNTER — Ambulatory Visit: Payer: BLUE CROSS/BLUE SHIELD | Attending: Student in an Organized Health Care Education/Training Program

## 2023-03-11 DIAGNOSIS — M47816 Spondylosis without myelopathy or radiculopathy, lumbar region: Secondary | ICD-10-CM

## 2023-03-11 DIAGNOSIS — M25552 Pain in left hip: Secondary | ICD-10-CM

## 2023-03-11 DIAGNOSIS — M5416 Radiculopathy, lumbar region: Secondary | ICD-10-CM

## 2023-03-11 MED ORDER — TRAMADOL HCL 50 MG PO TABS
50 mg | ORAL_TABLET | Freq: Four times a day (QID) | ORAL | 0 refills | Status: AC | PRN
Start: 2023-03-11 — End: ?
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
## 2023-03-16 ENCOUNTER — Ambulatory Visit (INDEPENDENT_AMBULATORY_CARE_PROVIDER_SITE_OTHER): Payer: Medicare HMO

## 2023-03-16 DIAGNOSIS — I639 Cerebral infarction, unspecified: Secondary | ICD-10-CM | POA: Diagnosis not present

## 2023-03-17 LAB — CUP PACEART REMOTE DEVICE CHECK
Date Time Interrogation Session: 20250216230327
Implantable Pulse Generator Implant Date: 20220328

## 2023-03-17 MED ORDER — BACLOFEN 10 MG PO TABS
10 mg | ORAL_TABLET | Freq: Every day | ORAL | 0 refills | Status: AC | PRN
Start: 2023-03-17 — End: ?

## 2023-03-18 ENCOUNTER — Encounter: Payer: Self-pay | Admitting: Cardiology

## 2023-03-23 ENCOUNTER — Ambulatory Visit: Payer: BLUE CROSS/BLUE SHIELD | Attending: Student in an Organized Health Care Education/Training Program

## 2023-03-23 NOTE — Progress Notes (Signed)
 [facility] Summary Report / Loop Recorder

## 2023-03-24 ENCOUNTER — Ambulatory Visit: Payer: Medicare HMO | Admitting: Cardiovascular Disease

## 2023-04-07 ENCOUNTER — Telehealth: Payer: MEDICARE

## 2023-04-08 ENCOUNTER — Ambulatory Visit: Payer: MEDICARE | Attending: Student in an Organized Health Care Education/Training Program

## 2023-04-20 ENCOUNTER — Ambulatory Visit (INDEPENDENT_AMBULATORY_CARE_PROVIDER_SITE_OTHER): Payer: Medicare HMO

## 2023-04-20 DIAGNOSIS — I639 Cerebral infarction, unspecified: Secondary | ICD-10-CM

## 2023-04-20 LAB — CUP PACEART REMOTE DEVICE CHECK
Date Time Interrogation Session: 20250323230638
Implantable Pulse Generator Implant Date: 20220328

## 2023-04-22 NOTE — Addendum Note (Signed)
 Addended by: Geralyn Flash D on: 04/22/2023 11:36 AM   Modules accepted: Orders

## 2023-04-22 NOTE — Progress Notes (Signed)
 [facility] Summary Report / Loop Recorder

## 2023-04-25 ENCOUNTER — Encounter: Payer: Self-pay | Admitting: Cardiology

## 2023-04-27 NOTE — Progress Notes (Unsigned)
 PCP: Danella Penton, MD   No chief complaint on file.   HPI:      [name] is a 68 y.o. G5P0 whose LMP was No LMP recorded. Patient is postmenopausal., presents today for her annual examination.  Her menses are {norm/abn:715}, lasting {number: 22536} days.  Dysmenorrhea {dysmen:716}. She {does:18564} have intermenstrual bleeding. She {does:18564} have vasomotor sx.   Sex activity: {sex active: 315163}. She {does:18564} have vaginal dryness.  Last Pap: 03/28/20 Results were: no abnormalities /neg HPV DNA.  Hx of STDs: {STD hx:14358}  Last mammogram: 01/19/23  Results were: normal--routine follow-up in 12 months There is no FH of breast cancer. There is no FH of ovarian cancer. The patient {does:18564} do self-breast exams.  Colonoscopy: 6/24  Repeat due after 10*** years.   Tobacco use: {tob:20664} Alcohol use: {Alcohol:11675} No drug use Exercise: {exercise:31265}  She {does:18564} get adequate calcium and Vitamin D in her diet.  Labs with PCP.   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   Atherosclerosis of native arteries of extremity with intermittent claudication (HCC) 10/05/2019   GERD (gastroesophageal reflux disease) 09/26/2019   Low serum vitamin D 08/25/2018   B12 deficiency 11/21/2015   Acquired hypothyroidism 08/09/2015   Cardiomyopathy, ischemic 08/09/2015   Hyperlipidemia, mixed 08/09/2015   Dizziness 06/13/2012   CAD (coronary artery disease) 06/13/2012   H/O irritable bowel syndrome 10/20/2011    Past Surgical History:  Procedure Laterality Date   ANGIOPLASTY  2010   2 stents (femoral)    BUBBLE STUDY  04/23/2020   Procedure: BUBBLE STUDY;   Surgeon: Sande Rives, MD;  Location: [facility] ENDOSCOPY;  Service: Cardiovascular;;   COLONOSCOPY WITH PROPOFOL N/A 07/09/2022   Procedure: COLONOSCOPY WITH PROPOFOL;  Surgeon: Toledo, Boykin Nearing, MD;  Location: [facility] ENDOSCOPY;  Service: Gastroenterology;  Laterality: N/A;   IR CT HEAD LTD  04/18/2020   IR PERCUTANEOUS ART THROMBECTOMY/INFUSION INTRACRANIAL INC DIAG ANGIO  04/18/2020   LOOP RECORDER INSERTION N/A 04/23/2020   Procedure: LOOP RECORDER INSERTION;  Surgeon: Lanier Prude, MD;  Location: [facility] INVASIVE CV LAB;  Service: Cardiovascular;  Laterality: N/A;   RADIOLOGY WITH ANESTHESIA N/A 04/18/2020   Procedure: IR WITH ANESTHESIA;  Surgeon: Radiologist, Medication, MD;  Location: [facility] OR;  Service: Radiology;  Laterality: N/A;   TEE WITHOUT CARDIOVERSION N/A 04/23/2020   Procedure: TRANSESOPHAGEAL ECHOCARDIOGRAM (TEE);  Surgeon: Sande Rives, MD;  Location: [facility] ENDOSCOPY;  Service: Cardiovascular;  Laterality: N/A;   TUBAL LIGATION      Family History  Problem Relation Age of Onset   Diabetes Mother    Hypertension Mother    Stroke Maternal Grandmother    Diabetes Maternal Grandfather    Stroke Maternal Grandfather    Breast cancer Other     Social History   Socioeconomic History   Marital status: Married    Spouse name: Not on file   Number of children: Not on file   Years of education: Not on file   Highest education level: Not on file  Occupational History   Not on file  Tobacco Use   Smoking status: Former    Current packs/day: 0.00    Average packs/day: 1.5 packs/day for 35.1 years (  52.6 ttl pk-yrs)    Types: Cigarettes    Start date: 03/22/1973    Quit date: 04/27/2008    Years since quitting: 15.0   Smokeless tobacco: Never  Vaping Use   Vaping status: Never Used  Substance and Sexual Activity   Alcohol use: No    Alcohol/week: 0.0 standard drinks of alcohol   Drug use: Never   Sexual activity: Yes    Birth control/protection: Post-menopausal   Other Topics Concern   Not on file  Social History Narrative   Not on file   Social Drivers of Health   Financial Resource Strain: Low Risk  (10/30/2022)   Received from [facility]   Overall Financial Resource Strain (CARDIA)    Difficulty of Paying Living Expenses: Not hard at all  Food Insecurity: No Food Insecurity (10/30/2022)   Received from [facility]   Hunger Vital Sign    Worried About Running Out of Food in the Last Year: Never true    Ran Out of Food in the Last Year: Never true  Transportation Needs: No Transportation Needs (10/30/2022)   Received from [facility]    In the past 12 months, has lack of transportation kept you from medical appointments or from getting medications?: No    Lack of Transportation (Non-Medical): No  Physical Activity: Not on file  Stress: Not on file  Social Connections: Not on file  Intimate Partner Violence: Not on file     Current Outpatient Medications:    Cholecalciferol 50 MCG (2000 UT) TABS, Take 2,000 Units by mouth daily., Disp: , Rfl:    clopidogrel (PLAVIX) 75 MG tablet, TAKE 1 TABLET BY MOUTH DAILY, Disp: 90 tablet, Rfl: 3   cyanocobalamin (,VITAMIN B-12,) 1000 MCG/ML injection, Inject 1,000 mcg into the muscle every 30 (thirty) days., Disp: , Rfl:    escitalopram (LEXAPRO) 10 MG tablet, Take by mouth., Disp: , Rfl:    ezetimibe (ZETIA) 10 MG tablet, TAKE 1 TABLET BY MOUTH DAILY, Disp: 90 tablet, Rfl: 3   levothyroxine (SYNTHROID) 75 MCG tablet, Take 75 mcg by mouth daily., Disp: , Rfl:    metoprolol succinate (TOPROL-XL) 25 MG 24 hr tablet, Take 1 tablet (25 mg total) by mouth daily., Disp: 90 tablet, Rfl: 3   nitroGLYCERIN (NITROSTAT) 0.4 MG SL tablet, Dissolve 1 tablet under the tongue every 5 minutes as needed for chest pain. Max of 3 doses, then 911., Disp: 25 tablet, Rfl: 6   pantoprazole (PROTONIX) 40 MG tablet, Take 40 mg by mouth daily.,  Disp: , Rfl:    ramipril (ALTACE) 5 MG capsule, Take 1 capsule (5 mg total) by mouth daily., Disp: 90 capsule, Rfl: 3   rosuvastatin (CRESTOR) 40 MG tablet, Take 1 tablet (40 mg total) by mouth daily., Disp: 90 tablet, Rfl: 3   spironolactone (ALDACTONE) 25 MG tablet, Take 1 tablet (25 mg total) by mouth daily., Disp: 90 tablet, Rfl: 3     ROS:  Review of Systems BREAST: No symptoms    Objective: There were no vitals taken for this visit.   OBGyn Exam  Results: No results found for this or any previous visit (from the past 24 hours).  Assessment/Plan:  No diagnosis found.   No orders of the defined types were placed in this encounter.           GYN counsel {counseling: 16159}    F/U  No follow-ups on file.  Helmut Muster  B. Emad Brechtel, PA-C 04/27/2023 9:13 PM

## 2023-04-28 ENCOUNTER — Other Ambulatory Visit (HOSPITAL_COMMUNITY)
Admission: RE | Admit: 2023-04-28 | Discharge: 2023-04-28 | Disposition: A | Discharge status: Home / Self Care | Source: Ambulatory Visit | Attending: Obstetrics and Gynecology | Admitting: Obstetrics and Gynecology

## 2023-04-28 ENCOUNTER — Encounter: Payer: Self-pay | Admitting: Obstetrics and Gynecology

## 2023-04-28 ENCOUNTER — Ambulatory Visit (INDEPENDENT_AMBULATORY_CARE_PROVIDER_SITE_OTHER): Admitting: Obstetrics and Gynecology

## 2023-04-28 VITALS — BP 136/78 | Ht 65.0 in | Wt 220.0 lb

## 2023-04-28 DIAGNOSIS — Z124 Encounter for screening for malignant neoplasm of cervix: Secondary | ICD-10-CM | POA: Insufficient documentation

## 2023-04-28 DIAGNOSIS — Z1211 Encounter for screening for malignant neoplasm of colon: Secondary | ICD-10-CM

## 2023-04-28 DIAGNOSIS — Z13820 Encounter for screening for osteoporosis: Secondary | ICD-10-CM

## 2023-04-28 DIAGNOSIS — Z01419 Encounter for gynecological examination (general) (routine) without abnormal findings: Secondary | ICD-10-CM | POA: Diagnosis not present

## 2023-04-28 DIAGNOSIS — Z1231 Encounter for screening mammogram for malignant neoplasm of breast: Secondary | ICD-10-CM

## 2023-04-28 NOTE — Patient Instructions (Addendum)
 I value your feedback and you entrusting [place] with your care. If you get a [facility] patient survey, I would appreciate you taking the time to let us know about your experience today. Thank you!  [facility] ([place])--[phone]

## 2023-04-29 LAB — CYTOLOGY - PAP: Diagnosis: NEGATIVE

## 2023-05-25 ENCOUNTER — Encounter: Payer: Self-pay | Admitting: Cardiology

## 2023-05-25 ENCOUNTER — Ambulatory Visit (INDEPENDENT_AMBULATORY_CARE_PROVIDER_SITE_OTHER): Payer: Medicare HMO

## 2023-05-25 DIAGNOSIS — I639 Cerebral infarction, unspecified: Secondary | ICD-10-CM | POA: Diagnosis not present

## 2023-05-25 LAB — CUP PACEART REMOTE DEVICE CHECK
Date Time Interrogation Session: 20250427230515
Implantable Pulse Generator Implant Date: 20220328

## 2023-05-29 ENCOUNTER — Ambulatory Visit
Admission: RE | Admit: 2023-05-29 | Discharge: 2023-05-29 | Disposition: A | Discharge status: Home / Self Care | Source: Ambulatory Visit | Attending: Obstetrics and Gynecology | Admitting: Obstetrics and Gynecology

## 2023-05-29 DIAGNOSIS — Z780 Asymptomatic menopausal state: Secondary | ICD-10-CM | POA: Diagnosis not present

## 2023-05-29 DIAGNOSIS — Z13820 Encounter for screening for osteoporosis: Secondary | ICD-10-CM | POA: Insufficient documentation

## 2023-06-01 ENCOUNTER — Encounter: Payer: Self-pay | Admitting: Obstetrics and Gynecology

## 2023-06-05 NOTE — Progress Notes (Signed)
 [facility] Summary Report / Loop Recorder

## 2023-06-29 ENCOUNTER — Ambulatory Visit (INDEPENDENT_AMBULATORY_CARE_PROVIDER_SITE_OTHER)

## 2023-06-29 ENCOUNTER — Ambulatory Visit: Payer: Self-pay | Admitting: Cardiology

## 2023-06-29 DIAGNOSIS — I639 Cerebral infarction, unspecified: Secondary | ICD-10-CM | POA: Diagnosis not present

## 2023-06-29 LAB — CUP PACEART REMOTE DEVICE CHECK
Date Time Interrogation Session: 20250601231447
Implantable Pulse Generator Implant Date: 20220328

## 2023-07-01 ENCOUNTER — Encounter: Payer: Self-pay | Admitting: Cardiovascular Disease

## 2023-07-01 NOTE — Progress Notes (Unsigned)
 Cardiology Office Note:    Date:  07/02/2023   ID:  Tracey [name], DOB [birth year], MRN [id number]  PCP:  Sari Cunning, MD   [facility]  Cardiologist:  Shaunte Tuft  Advanced Practice Provider:  No care team member to display Electrophysiologist:  Marven Slimmer    :[id number]}   Referring MD: Sari Cunning, MD   Chief Complaint  Patient presents with   Coronary Artery Disease    May 22, 2020   Tracey [name] is a 68 y.o. female with a hx of CVA, CAD ( s/p anterior MI with stent placement in 2010) Was a smoker at the time ,   She was admitted in March with a CVA. 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 Marven Slimmer She has moderate ( grade III) layered plaque in the transverse and descending aorta  She had mildly reduced LV function with apical akinesis.  Still speech is altered.  Going to speech therapy,  Some difficult with reading and writing . Still has some headaches   She is here to transfer from her cardiologist  at [facility]  No CP , no dyspnea She has some continued discomfort  from her implantable loop recorder   Has been found to have PAD   July 26, 2021: Tracey [name] is seen today for follow-up of her coronary artery disease.  (status post anterior wall MI in 2010 with stent placement to the LAD )She has a history of a CVA.  Transesophageal echo revealed an aneurysmal atrial septum with a PFO.  She has had an implantable loop placed by Dr. Marven Slimmer.  Doing well.  Had a loop recorder for a stroke. No further episodes of TIA or stroke   Has occasional panic attacks Wants to cut back on her ASA ( currently on ASA 162- I told her I would defer to neuro on this issue  The cost of interrogation of the loop recorder costs $200 / month.  She has a history of anteroapical myocardial infarction.  She has an area of dyskinesis in the anterior apex.  Recent echo in May did not show a thrombus but I am  concerned that if she has mural thrombus in that dyskinetic area that this may be an etiology for her stroke.  We will plan on getting an MRI to evaluate her LV and to look for thrombus in the apex.  If she does have thrombus I think she should be started on DOAC Or Coumadin and DC the ASA and plavix     January 07, 2022 Tracey [name] is seen today for follow-up of her coronary artery disease, past history of anteroapical myocardial infarction.  She had an area of dyskinesis in the anterior apex and she has had a subsequent stroke.  I suspect that she had a mural thrombus in the dyskinetic area.  Cardiac MRI reveals anterior septal and mid anterior hypokinesis.  There is apical septal akinesis with an ejection fraction of 48%.  No thrombus was seen.  Plavix  was continued per the stroke team.  Feeling well   Lipid panel at [facility] the [facility] 10/02/21 reveals an LDL of 40.  HDL is 54.  Total cholesterol is 117, triglyceride level is 113  (rosuvastatin  40 mg a day plus Zetia  10 mg a day  She is having muscle aches on the rosuvastatin  40 but will tolerate it for now She will be on Medicare  in March, 2024.  I encouraged her to consider starting a PCSK9 inhibitor at that time.   Before she signs up for her next insurance plan she needs to make sure that it will cover routine doctor visits.  She will also need to have coverage for Repatha and or Praluent  Walking regularly   Is being followed by EP , last seen by Michaelle Adolphus, AP  I would like to have her consider implantable loop recorder when she is Medicare eligible.   Jun 16, 2022 Tracey [name] is a 68 yo female with hx of Ant. Apical MI  with evidence of an apical aneurism  Has some palpitations Had a PVC on her ECG today   Having some left leg swelling after long bus ride Has had thrombophlebitis in that leg in the past  We discussed having her see [facility].   Has seen [facility]     Lipid levels from the [facility] reveals Total cholesterol is 126 Triglyceride levels 121 HDL is 63.7 LDL is 38 Complete metabolic profile looks within normal limits. TSH is 2.433   Nov. 25, 2024 Tracey [name] is seen for follow up of her ant. Apical MI ( Ant. MI 2010, was seen at [facility] ) with evidence of apical aneurism Has had a stroke  Has an implantable loop recorder following her stroke    Has had bronchitis  Still exercising ,  has some leg issues  Her speech is better, had dysarthria after her stroke 2 year ago   We discussed adding some CHF meds (  She has no signs or symptoms of volume overload or CHF Her EF of 48% is most certalinly due to her ant. MI in 2010.  July 02, 2023 Tracey [name] is seen for follow up of her CAD, s/p large ant. MI Hx of CVA HLD  Did not take her spironolactone   Was concerned about taking it     Past Medical History:  Diagnosis Date   Anxiety    Bronchitis    CAD (coronary artery disease)    a. 04/2008: cariac arrest with prox LAD occ s/p stenting, arctic sun. b. 11/2008: restenosis s/p repeat stenting. c. 01/2011: abnormal stress test but cath reportedly OK.   Dysplastic nevus 11/04/2007   Ant. chest. Slight to moderate atypia, extends to one edge.    Dysplastic nevus 01/03/2009   Left breast. Minimal atypia, close to margin.   Hypothyroid    Myocardial infarction [facility]) 2010   stents and cardiac rehab    Stroke [facility])     Past Surgical History:  Procedure Laterality Date   ANGIOPLASTY  2010   2 stents (femoral)    BUBBLE STUDY  04/23/2020   Procedure: BUBBLE STUDY;  Surgeon: Harrold Lincoln, MD;  Location: [facility] ENDOSCOPY;  Service: Cardiovascular;;   COLONOSCOPY WITH PROPOFOL  N/A 07/09/2022   Procedure: COLONOSCOPY WITH PROPOFOL ;  Surgeon: Toledo, Alphonsus Jeans, MD;  Location: [facility] ENDOSCOPY;  Service: Gastroenterology;  Laterality: N/A;   IR CT HEAD LTD  04/18/2020   IR PERCUTANEOUS ART THROMBECTOMY/INFUSION INTRACRANIAL INC DIAG ANGIO  04/18/2020   LOOP RECORDER INSERTION  N/A 04/23/2020   Procedure: LOOP RECORDER INSERTION;  Surgeon: Boyce Byes, MD;  Location: [facility] INVASIVE CV LAB;  Service: Cardiovascular;  Laterality: N/A;   RADIOLOGY WITH ANESTHESIA N/A 04/18/2020   Procedure: IR WITH ANESTHESIA;  Surgeon: Radiologist, Medication, MD;  Location: [facility] OR;  Service: Radiology;  Laterality: N/A;   TEE WITHOUT CARDIOVERSION N/A 04/23/2020  Procedure: TRANSESOPHAGEAL ECHOCARDIOGRAM (TEE);  Surgeon: Harrold Lincoln, MD;  Location: [facility] ENDOSCOPY;  Service: Cardiovascular;  Laterality: N/A;   TUBAL LIGATION      Current Medications: Current Meds  Medication Sig   cholecalciferol (VITAMIN D3) 25 MCG (1000 UNIT) tablet Take 1,000 Units by mouth daily.   clopidogrel  (PLAVIX ) 75 MG tablet TAKE 1 TABLET BY MOUTH DAILY   cyanocobalamin  (,VITAMIN B-12,) 1000 MCG/ML injection Inject 1,000 mcg into the muscle every 30 (thirty) days.   escitalopram  (LEXAPRO ) 10 MG tablet Take by mouth.   ezetimibe  (ZETIA ) 10 MG tablet TAKE 1 TABLET BY MOUTH DAILY   levothyroxine  (SYNTHROID ) 75 MCG tablet Take 75 mcg by mouth daily.   lisinopril (ZESTRIL) 20 MG tablet Take 20 mg by mouth 2 (two) times daily.   metoprolol  succinate (TOPROL -XL) 25 MG 24 hr tablet Take 1 tablet (25 mg total) by mouth daily.   nitroGLYCERIN  (NITROSTAT ) 0.4 MG SL tablet Dissolve 1 tablet under the tongue every 5 minutes as needed for chest pain. Max of 3 doses, then 911.   pantoprazole  (PROTONIX ) 40 MG tablet Take 40 mg by mouth daily.   ramipril  (ALTACE ) 5 MG capsule Take 1 capsule (5 mg total) by mouth daily.   rosuvastatin  (CRESTOR ) 40 MG tablet Take 1 tablet (40 mg total) by mouth daily.     Allergies:   Sulfa antibiotics   Social History   Socioeconomic History   Marital status: Married    Spouse name: Not on file   Number of children: Not on file   Years of education: Not on file   Highest education level: Not on file  Occupational History   Not on file  Tobacco Use   Smoking status:  Former    Current packs/day: 0.00    Average packs/day: 1.5 packs/day for 35.1 years (52.6 ttl pk-yrs)    Types: Cigarettes    Start date: 03/22/1973    Quit date: 04/27/2008    Years since quitting: 15.1   Smokeless tobacco: Never  Vaping Use   Vaping status: Never Used  Substance and Sexual Activity   Alcohol use: No    Alcohol/week: 0.0 standard drinks of alcohol   Drug use: Never   Sexual activity: Yes    Birth control/protection: Post-menopausal  Other Topics Concern   Not on file  Social History Narrative   Not on file   Social Drivers of Health   Financial Resource Strain: Low Risk  (10/30/2022)   Received from [facility]   Overall Financial Resource Strain (CARDIA)    Difficulty of Paying Living Expenses: Not hard at all  Food Insecurity: No Food Insecurity (10/30/2022)   Received from [facility]   Hunger Vital Sign    Worried About Running Out of Food in the Last Year: Never true    Ran Out of Food in the Last Year: Never true  Transportation Needs: No Transportation Needs (10/30/2022)   Received from [facility] - Transportation    In the past 12 months, has lack of transportation kept you from medical appointments or from getting medications?: No    Lack of Transportation (Non-Medical): No  Physical Activity: Not on file  Stress: Not on file  Social Connections: Not on file     Family History: The patient's family history includes Breast cancer in an other family member; Diabetes in her maternal grandfather and mother; Hypertension in her mother; Stroke in her maternal grandfather and  maternal grandmother.  ROS:   Please see the history of present illness.     All other systems reviewed and are negative.  EKGs/Labs/Other Studies Reviewed:    The following studies were reviewed today:   EKG:    EKG Interpretation Date/Time:  Thursday July 02 2023 11:03:11 EDT Ventricular Rate:  71 PR  Interval:  184 QRS Duration:  70 QT Interval:  402 QTC Calculation: 436 R Axis:   -8  Text Interpretation: Normal sinus rhythm Low voltage QRS Septal infarct , age undetermined When compared with ECG of 13-Jun-2021 08:16, No significant change since last tracing Confirmed by Ahmad Alert [id number]) on 07/02/2023 11:13:56 AM     Recent Labs: No results found for requested labs within last 365 days.  Recent Lipid Panel    Component Value Date/Time   CHOL 160 06/13/2021 0800   TRIG 98 06/13/2021 0800   HDL 56 06/13/2021 0800   CHOLHDL 2.9 06/13/2021 0800   VLDL 20 06/13/2021 0800   LDLCALC 84 06/13/2021 0800     Risk Assessment/Calculations:       Physical Exam:     Physical Exam: Blood pressure 128/68, pulse 71, height 5\' 5"  (1.651 m), weight 217 lb (98.4 kg), SpO2 99%.       GEN:  Well nourished, well developed in no acute distress HEENT: Normal NECK: No JVD; No carotid bruits LYMPHATICS: No lymphadenopathy CARDIAC: RRR , no murmurs, rubs, gallops RESPIRATORY:  Clear to auscultation without rales, wheezing or rhonchi  ABDOMEN: Soft, non-tender, non-distended MUSCULOSKELETAL:  No edema; No deformity  SKIN: Warm and dry NEUROLOGIC:  Alert and oriented x 3    ASSESSMENT:    1. Coronary artery disease of native artery of native heart with stable angina pectoris (HCC)   2. Hyperlipidemia, mixed   3. HFrEF (heart failure with reduced ejection fraction) (HCC)   4. Medication management        PLAN:       Coronary artery disease:    She had a massive anterior wall myocardial infarction in 2010.  She has not had any angina since that time.    2.  History of CVA: She has an implantable loop recorder.  See history of apical aneurysm in the past.  MRI of the heart in September, 2023 reveals apical septal akinesis but no evidence of aneurysm.       3.  Hyperlipidemia:       4.  Left leg thrombophlebitis:    5.   Heart failure with reduced EF: She has an EF  of 48%.     She does have some mild shortness of breath with exertion.     She did not start the Spironolactone    - was concerned  I've discussed it and she is willing to try I t Start spiro 25 mg a day ,  BMP I 2 weeks     Medication Adjustments/Labs and Tests Ordered: Current medicines are reviewed at length with the patient today.  Concerns regarding medicines are outlined above.  Orders Placed This Encounter  Procedures   Basic metabolic panel with GFR   EKG 69-GEXB   No orders of the defined types were placed in this encounter.   Patient Instructions  Medication Instructions:  Your physician recommends that you continue on your current medications as directed. Please refer to the Current Medication list given to you today.  ** Restart Spironolactone  25mg  - 1 tablet by mouth daily *If you need a refill on your  cardiac medications before your next appointment, please call your pharmacy*  Lab Work: BMET in 2 weeks  If you have labs (blood work) drawn today and your tests are completely normal, you will receive your results only by: MyChart Message (if you have MyChart) OR A paper copy in the mail If you have any lab test that is abnormal or we need to change your treatment, we will call you to review the results.  Testing/Procedures: None ordered.   Follow-Up: At [facility], you and your health needs are our priority.  As part of our continuing mission to provide you with exceptional heart care, our providers are all part of one team.  This team includes your primary Cardiologist (physician) and Advanced Practice Providers or APPs (Physician Assistants and Nurse Practitioners) who all work together to provide you with the care you need, when you need it.  Your next appointment:   6 months with Dr Paulita Boss       Signed, Ahmad Alert, MD  07/02/2023 5:48 PM    [facility]

## 2023-07-02 ENCOUNTER — Ambulatory Visit: Payer: Medicare HMO | Attending: Cardiovascular Disease | Admitting: Cardiovascular Disease

## 2023-07-02 ENCOUNTER — Encounter: Payer: Self-pay | Admitting: Cardiovascular Disease

## 2023-07-02 VITALS — BP 128/68 | HR 71 | Ht 65.0 in | Wt 217.0 lb

## 2023-07-02 DIAGNOSIS — I25118 Atherosclerotic heart disease of native coronary artery with other forms of angina pectoris: Secondary | ICD-10-CM | POA: Diagnosis not present

## 2023-07-02 DIAGNOSIS — I5020 Unspecified systolic (congestive) heart failure: Secondary | ICD-10-CM | POA: Diagnosis not present

## 2023-07-02 DIAGNOSIS — Z79899 Other long term (current) drug therapy: Secondary | ICD-10-CM | POA: Diagnosis not present

## 2023-07-02 DIAGNOSIS — E782 Mixed hyperlipidemia: Secondary | ICD-10-CM | POA: Diagnosis not present

## 2023-07-02 NOTE — Patient Instructions (Signed)
 Medication Instructions:  Your physician recommends that you continue on your current medications as directed. Please refer to the Current Medication list given to you today.  ** Restart Spironolactone  25mg  - 1 tablet by mouth daily *If you need a refill on your cardiac medications before your next appointment, please call your pharmacy*  Lab Work: BMET in 2 weeks  If you have labs (blood work) drawn today and your tests are completely normal, you will receive your results only by: MyChart Message (if you have MyChart) OR A paper copy in the mail If you have any lab test that is abnormal or we need to change your treatment, we will call you to review the results.  Testing/Procedures: None ordered.   Follow-Up: At [facility], you and your health needs are our priority.  As part of our continuing mission to provide you with exceptional heart care, our providers are all part of one team.  This team includes your primary Cardiologist (physician) and Advanced Practice Providers or APPs (Physician Assistants and Nurse Practitioners) who all work together to provide you with the care you need, when you need it.  Your next appointment:   6 months with Dr Paulita Boss

## 2023-07-10 NOTE — Progress Notes (Signed)
 [facility] Summary Report / Loop Recorder

## 2023-07-12 ENCOUNTER — Other Ambulatory Visit: Payer: Self-pay | Admitting: Cardiovascular Disease

## 2023-07-12 DIAGNOSIS — I639 Cerebral infarction, unspecified: Secondary | ICD-10-CM

## 2023-07-12 DIAGNOSIS — I25118 Atherosclerotic heart disease of native coronary artery with other forms of angina pectoris: Secondary | ICD-10-CM

## 2023-07-13 ENCOUNTER — Telehealth: Payer: Self-pay | Admitting: Cardiovascular Disease

## 2023-07-13 DIAGNOSIS — I639 Cerebral infarction, unspecified: Secondary | ICD-10-CM

## 2023-07-13 DIAGNOSIS — I25118 Atherosclerotic heart disease of native coronary artery with other forms of angina pectoris: Secondary | ICD-10-CM

## 2023-07-13 MED ORDER — EZETIMIBE 10 MG PO TABS: 10.0000 mg | ORAL_TABLET | Freq: Every day | ORAL | 3 refills | Status: AC

## 2023-07-13 NOTE — Telephone Encounter (Signed)
 RX sent to requested Pharmacy

## 2023-07-13 NOTE — Telephone Encounter (Signed)
*  STAT* If patient is at the pharmacy, call can be transferred to refill team.   1. Which medications need to be refilled? (please list name of each medication and dose if known) ezetimibe  (ZETIA ) 10 MG tablet    4. Which pharmacy/location (including street and city if local pharmacy) is medication to be sent to?  [facility] [id number] - [place], [place] - [address]    5. Do they need a 30 day or 90 day supply?   90

## 2023-07-20 ENCOUNTER — Other Ambulatory Visit: Payer: Self-pay

## 2023-07-20 DIAGNOSIS — I10 Essential (primary) hypertension: Secondary | ICD-10-CM

## 2023-07-20 DIAGNOSIS — Z79899 Other long term (current) drug therapy: Secondary | ICD-10-CM

## 2023-07-20 DIAGNOSIS — E782 Mixed hyperlipidemia: Secondary | ICD-10-CM

## 2023-07-20 DIAGNOSIS — I255 Ischemic cardiomyopathy: Secondary | ICD-10-CM

## 2023-07-20 DIAGNOSIS — R42 Dizziness and giddiness: Secondary | ICD-10-CM

## 2023-07-20 LAB — BASIC METABOLIC PANEL WITH GFR
BUN/Creatinine Ratio: 13 (ref 12–28)
BUN: 11 mg/dL (ref 8–27)
CO2: 20 mmol/L (ref 20–29)
Calcium: 9.3 mg/dL (ref 8.7–10.3)
Chloride: 104 mmol/L (ref 96–106)
Creatinine, Ser: 0.83 mg/dL (ref 0.57–1.00)
Glucose: 89 mg/dL (ref 70–99)
Potassium: 5.1 mmol/L (ref 3.5–5.2)
Sodium: 142 mmol/L (ref 134–144)
eGFR: 78 mL/min/{1.73_m2} (ref >59)

## 2023-07-20 NOTE — Progress Notes (Signed)
 Phone call from [facility] asking if BMET lab has been released as they re unable to see request and pt is present.  Advised lab order was placed and released.  Order replaced and released as lab was unable to see the original per Dr Alveta.

## 2023-07-25 ENCOUNTER — Ambulatory Visit: Payer: Self-pay | Admitting: Cardiovascular Disease

## 2023-07-27 ENCOUNTER — Other Ambulatory Visit: Payer: Self-pay | Admitting: *Deleted

## 2023-07-27 MED ORDER — CLOPIDOGREL BISULFATE 75 MG PO TABS: 75.0000 mg | ORAL_TABLET | Freq: Every day | ORAL | 3 refills | Status: AC

## 2023-07-30 ENCOUNTER — Ambulatory Visit

## 2023-07-30 DIAGNOSIS — I255 Ischemic cardiomyopathy: Secondary | ICD-10-CM | POA: Diagnosis not present

## 2023-07-30 LAB — CUP PACEART REMOTE DEVICE CHECK
Date Time Interrogation Session: 20250702230921
Implantable Pulse Generator Implant Date: 20220328

## 2023-08-08 ENCOUNTER — Ambulatory Visit: Payer: Self-pay | Admitting: Cardiology

## 2023-08-18 NOTE — Progress Notes (Signed)
 [facility] Summary Report / Loop Recorder

## 2023-08-31 ENCOUNTER — Ambulatory Visit

## 2023-08-31 DIAGNOSIS — I639 Cerebral infarction, unspecified: Secondary | ICD-10-CM | POA: Diagnosis not present

## 2023-09-01 LAB — CUP PACEART REMOTE DEVICE CHECK
Date Time Interrogation Session: 20250802230320
Implantable Pulse Generator Implant Date: 20220328

## 2023-09-02 ENCOUNTER — Ambulatory Visit: Payer: Self-pay | Admitting: Cardiology

## 2023-10-01 ENCOUNTER — Ambulatory Visit (INDEPENDENT_AMBULATORY_CARE_PROVIDER_SITE_OTHER)

## 2023-10-01 DIAGNOSIS — I639 Cerebral infarction, unspecified: Secondary | ICD-10-CM | POA: Diagnosis not present

## 2023-10-01 LAB — CUP PACEART REMOTE DEVICE CHECK
Date Time Interrogation Session: 20250903230843
Implantable Pulse Generator Implant Date: 20220328

## 2023-10-03 ENCOUNTER — Ambulatory Visit: Payer: Self-pay | Admitting: Cardiology

## 2023-10-10 NOTE — Progress Notes (Signed)
 Remote Loop Recorder Transmission

## 2023-10-26 NOTE — Progress Notes (Signed)
 Remote Loop Recorder Transmission

## 2023-11-02 ENCOUNTER — Encounter

## 2023-11-04 ENCOUNTER — Ambulatory Visit (INDEPENDENT_AMBULATORY_CARE_PROVIDER_SITE_OTHER)

## 2023-11-04 DIAGNOSIS — I639 Cerebral infarction, unspecified: Secondary | ICD-10-CM

## 2023-11-04 NOTE — Progress Notes (Signed)
 Remote Loop Recorder Transmission

## 2023-11-05 LAB — CUP PACEART REMOTE DEVICE CHECK
Date Time Interrogation Session: 20251007230630
Implantable Pulse Generator Implant Date: 20220328

## 2023-11-09 ENCOUNTER — Ambulatory Visit: Payer: Self-pay | Admitting: Cardiology

## 2023-11-09 NOTE — Progress Notes (Signed)
 Remote Loop Recorder Transmission

## 2023-11-17 NOTE — Progress Notes (Unsigned)
  Electrophysiology Office Follow up Visit Note:    Date:  11/18/2023   ID:  Tracey [name], DOB January 11, 1956, MRN [id number]  PCP:  Cleotilde Oneil FALCON, MD  [facility] Cardiologist:  Aleene Passe, MD (Inactive)  [facility] Electrophysiologist:  OLE ONEIDA HOLTS, MD    Interval History:     Tracey [name] is a 68 y.o. female who presents for a follow up visit.   I last saw the patient Jun 10, 2022 for history of cryptogenic stroke and coronary artery disease.  We reestablished her loop recorder for remote monitoring at that appointment.  She is doing well.  No problems with her loop recorder.  No arrhythmias or cardiovascular complaints today.      Past medical, surgical, social and family history were reviewed.  ROS:   Please see the history of present illness.    All other systems reviewed and are negative.  EKGs/Labs/Other Studies Reviewed:    The following studies were reviewed today:          Physical Exam:    VS:  BP 108/66   Pulse 78   Ht 5' 4.5 (1.638 m)   Wt 212 lb (96.2 kg)   SpO2 97%   BMI 35.83 kg/m     Wt Readings from Last 3 Encounters:  11/18/23 212 lb (96.2 kg)  07/02/23 217 lb (98.4 kg)  04/28/23 220 lb (99.8 kg)     GEN: no distress CARD: RRR, No MRG RESP: No IWOB. CTAB.      ASSESSMENT:    1. Cerebrovascular accident (CVA), unspecified mechanism (HCC)   2. Essential hypertension    PLAN:    In order of problems listed above:  #Cryptogenic stroke No interval atrial fibrillation episodes. Continue Plavix  Continue statin  #Hypertension At goal today.  Recommend checking blood pressures 1-2 times per week at home and recording the values.  Recommend bringing these recordings to the primary care physician. Continue ramipril , metoprolol  in the hospital.  She is not taking lisinopril.  Follow-up with primary care physician as scheduled  Follow-up with EP on an as-needed basis  Signed, OLE HOLTS, MD, [facility],  [facility] 11/18/2023 3:34 PM    Electrophysiology [facility]

## 2023-11-18 ENCOUNTER — Ambulatory Visit: Attending: Cardiovascular Disease | Admitting: Cardiology

## 2023-11-18 VITALS — BP 108/66 | HR 78 | Ht 64.5 in | Wt 212.0 lb

## 2023-11-18 DIAGNOSIS — I10 Essential (primary) hypertension: Secondary | ICD-10-CM | POA: Diagnosis not present

## 2023-11-18 DIAGNOSIS — I639 Cerebral infarction, unspecified: Secondary | ICD-10-CM | POA: Diagnosis not present

## 2023-11-18 NOTE — Patient Instructions (Signed)
 Medication Instructions:  Your physician recommends that you continue on your current medications as directed. Please refer to the Current Medication list given to you today.  *If you need a refill on your cardiac medications before your next appointment, please call your pharmacy*  Follow-Up: At [facility], you and your health needs are our priority.  As part of our continuing mission to provide you with exceptional heart care, our providers are all part of one team.  This team includes your primary Cardiologist (physician) and Advanced Practice Providers or APPs (Physician Assistants and Nurse Practitioners) who all work together to provide you with the care you need, when you need it.  Your next appointment:   As needed with EP

## 2023-11-26 ENCOUNTER — Other Ambulatory Visit: Payer: Self-pay | Admitting: Internal Medicine

## 2023-11-26 DIAGNOSIS — Z1231 Encounter for screening mammogram for malignant neoplasm of breast: Secondary | ICD-10-CM

## 2023-12-03 ENCOUNTER — Encounter

## 2023-12-04 ENCOUNTER — Telehealth: Payer: Self-pay | Admitting: Cardiology

## 2023-12-04 NOTE — Telephone Encounter (Signed)
 Pt called to cancel all remote pacer checks due to insurance no longer covering please advise

## 2023-12-05 ENCOUNTER — Encounter

## 2023-12-07 NOTE — Telephone Encounter (Signed)
 Left message requesting  call back.

## 2023-12-09 NOTE — Telephone Encounter (Signed)
 Pt called and lvm returning call and would like to speak with a nurse

## 2023-12-10 NOTE — Telephone Encounter (Signed)
 Returned call to Pt.  Discussed continued monitoring with Pt.  She has had her current loop recorder for 3.5 years with no afib detected.  Discussed with Dr. Cindie.  Per Dr. Cindie he was ok with stopping monitoring her loop.  Advised Pt at this time we can either discontinue monitoring completely or if she would prefer we can bring her into the office to check her loop every 91 days.  Pt would like to stop monitoring.  Also advised Pt she can either have her loop recorder removed or she can abandon the device with no adverse effects.  At this time she would prefer to leave the device in, but she will call if she changes her mind.  Will discontinue remote monitoring and Pt will follow up as needed.  Patient called, discussed options to leave device in or explanted.  Patient would like to leave device in.  Marked I in Paceart: done Enter note in Paceart:  done Canceled future remotes: done Discontinued from website:  done Entered in Speciality Comments: done  Advised if further questions arise to please call the device clinic at [phone].

## 2023-12-14 ENCOUNTER — Other Ambulatory Visit: Payer: Self-pay | Admitting: Physician Assistant

## 2023-12-16 MED ORDER — SPIRONOLACTONE 25 MG PO TABS: 25.0000 mg | ORAL_TABLET | Freq: Every day | ORAL | 2 refills | Status: AC

## 2023-12-31 ENCOUNTER — Ambulatory Visit
Admission: RE | Admit: 2023-12-31 | Discharge: 2023-12-31 | Disposition: A | Source: Ambulatory Visit | Attending: Internal Medicine | Admitting: Internal Medicine

## 2023-12-31 DIAGNOSIS — Z1231 Encounter for screening mammogram for malignant neoplasm of breast: Secondary | ICD-10-CM | POA: Diagnosis present

## 2024-01-04 ENCOUNTER — Encounter

## 2024-01-05 ENCOUNTER — Encounter

## 2024-01-20 ENCOUNTER — Other Ambulatory Visit: Payer: Self-pay | Admitting: Physician Assistant

## 2024-01-20 MED ORDER — NITROGLYCERIN 0.4 MG SL SUBL: SUBLINGUAL_TABLET | SUBLINGUAL | 9 refills | Status: AC

## 2024-02-03 ENCOUNTER — Ambulatory Visit: Attending: Cardiovascular Disease | Admitting: Internal Medicine

## 2024-02-03 VITALS — BP 100/67 | HR 62 | Ht 64.0 in | Wt 206.0 lb

## 2024-02-03 DIAGNOSIS — R079 Chest pain, unspecified: Secondary | ICD-10-CM | POA: Diagnosis not present

## 2024-02-03 DIAGNOSIS — I639 Cerebral infarction, unspecified: Secondary | ICD-10-CM | POA: Diagnosis not present

## 2024-02-03 DIAGNOSIS — I25118 Atherosclerotic heart disease of native coronary artery with other forms of angina pectoris: Secondary | ICD-10-CM | POA: Diagnosis not present

## 2024-02-03 NOTE — Progress Notes (Signed)
 " Cardiology Office Note:  .    Date:  02/03/2024  ID:  Tracey [name], DOB [birth year], MRN [id number] PCP: Cleotilde Oneil FALCON, MD  [facility] Providers Cardiologist:  Aleene Passe, MD (Inactive) Electrophysiologist:  OLE ONEIDA HOLTS, MD     CC: Transition to new cardiologist  History of Present Illness: .    Tracey [name] is a 69 y.o. female with coronary artery disease and a history of cryptogenic stroke who presents with episodes of resting tachycardia and exercise intolerance.  She experiences episodes of elevated resting heart rate, reaching 124 bpm, while at rest, such as when watching TV. These episodes occurred twice in the past few weeks and were detected by her watch, which alerts her daughter. No associated symptoms of atrial fibrillation or flutter during these episodes.  She has a history of coronary artery disease with a stent placed in the anterior descending artery following a massive heart attack in 2010. She also has a history of cryptogenic stroke in March 2022, after which an implantable loop recorder was placed. However, the loop recorder is no longer active due to insurance issues.  She experiences exercise intolerance, reporting significant difficulty with physical activity. She becomes winded easily, such as when walking uphill or taking out the garbage, requiring frequent stops due to shortness of breath and leg pain. She describes claudication in the back of her leg, which limits her ability to walk.  Her current medications include Plavix  75 mg daily, Zetia  10 mg daily, spironolactone  25 mg daily, and metoprolol  25 mg daily. She reports an LDL level of 84 in 2023.  She has a history of atrial septal aneurysm and a patent foramen ovale (PFO) but did not undergo PFO closure. She has not experienced atrial fibrillation or clotting as per the loop recorder findings.  Discussed the use of AI scribe software for clinical note transcription with the patient,  who gave verbal consent to proceed.   Relevant histories: .  Social - former PN patient, former CL patient ROS: As per HPI.   Studies Reviewed: .     Cardiac Studies & Procedures   ______________________________________________________________________________________________     ECHOCARDIOGRAM  ECHOCARDIOGRAM COMPLETE 06/13/2021  Narrative ECHOCARDIOGRAM REPORT    Patient Name:   Tracey [name] Date of Exam: 06/13/2021 Medical Rec #:  [id number]         Height:       65.0 in Accession #:    [id number]        Weight:       223.5 lb Date of Birth:  04-17-1955         BSA:          2.074 m Patient Age:    66 years          BP:           127/68 mmHg Patient Gender: F                 HR:           68 bpm. Exam Location:  Inpatient  Procedure: 2D Echo, Cardiac Doppler, Color Doppler and Intracardiac Opacification Agent  Indications:    CVA  History:        Patient has prior history of Echocardiogram examinations, most recent 04/23/2020. Previous Myocardial Infarction and CAD.  Sonographer:    Damien Senior Sonographer#2:  Eleanor Even RDCS (AE, PE) Referring Phys: [phone] RONDELL A SMITH  IMPRESSIONS   1. Distal septal diyskinesis .  Left ventricular ejection fraction, by estimation, is 45 to 50%. The left ventricle has mildly decreased function. The left ventricle demonstrates regional wall motion abnormalities (see scoring diagram/findings for description). Left ventricular diastolic parameters were normal. 2. Right ventricular systolic function is normal. The right ventricular size is normal. 3. The mitral valve is abnormal. Trivial mitral valve regurgitation. No evidence of mitral stenosis. 4. The aortic valve is normal in structure. There is mild calcification of the aortic valve. Aortic valve regurgitation is not visualized. Aortic valve sclerosis is present, with no evidence of aortic valve stenosis. 5. The inferior vena cava is normal in size with greater than  50% respiratory variability, suggesting right atrial pressure of 3 mmHg.  FINDINGS Left Ventricle: Distal septal diyskinesis. Left ventricular ejection fraction, by estimation, is 45 to 50%. The left ventricle has mildly decreased function. The left ventricle demonstrates regional wall motion abnormalities. Definity  contrast agent was given IV to delineate the left ventricular endocardial borders. The left ventricular internal cavity size was normal in size. There is no left ventricular hypertrophy. Left ventricular diastolic parameters were normal.  Right Ventricle: The right ventricular size is normal. No increase in right ventricular wall thickness. Right ventricular systolic function is normal.  Left Atrium: Left atrial size was normal in size.  Right Atrium: Right atrial size was normal in size.  Pericardium: There is no evidence of pericardial effusion.  Mitral Valve: The mitral valve is abnormal. There is mild thickening of the mitral valve leaflet(s). Mild mitral annular calcification. Trivial mitral valve regurgitation. No evidence of mitral valve stenosis.  Tricuspid Valve: The tricuspid valve is normal in structure. Tricuspid valve regurgitation is trivial. No evidence of tricuspid stenosis.  Aortic Valve: The aortic valve is normal in structure. There is mild calcification of the aortic valve. Aortic valve regurgitation is not visualized. Aortic valve sclerosis is present, with no evidence of aortic valve stenosis.  Pulmonic Valve: The pulmonic valve was normal in structure. Pulmonic valve regurgitation is not visualized. No evidence of pulmonic stenosis.  Aorta: The aortic root is normal in size and structure.  Venous: The inferior vena cava is normal in size with greater than 50% respiratory variability, suggesting right atrial pressure of 3 mmHg.  IAS/Shunts: No atrial level shunt detected by color flow Doppler.   LEFT VENTRICLE PLAX 2D LVIDd:         4.80 cm    Diastology LVIDs:         2.90 cm   LV e' medial:    9.25 cm/s LV PW:         0.80 cm   LV E/e' medial:  8.8 LV IVS:        0.80 cm   LV e' lateral:   9.79 cm/s LVOT diam:     2.10 cm   LV E/e' lateral: 8.4 LV SV:         67 LV SV Index:   32 LVOT Area:     3.46 cm   RIGHT VENTRICLE RV S prime:     10.90 cm/s TAPSE (M-mode): 1.9 cm  LEFT ATRIUM             Index        RIGHT ATRIUM           Index LA diam:        3.10 cm 1.49 cm/m   RA Area:     11.80 cm LA Vol (A2C):   52.5 ml 25.31 ml/m  RA Volume:  25.30 ml  12.20 ml/m LA Vol (A4C):   37.4 ml 18.03 ml/m LA Biplane Vol: 44.6 ml 21.50 ml/m AORTIC VALVE LVOT Vmax:   85.40 cm/s LVOT Vmean:  63.400 cm/s LVOT VTI:    0.193 m  AORTA Ao Root diam: 2.70 cm Ao Asc diam:  3.20 cm  MITRAL VALVE MV Area (PHT): 2.91 cm    SHUNTS MV Decel Time: 261 msec    Systemic VTI:  0.19 m MV E velocity: 81.80 cm/s  Systemic Diam: 2.10 cm MV A velocity: 69.70 cm/s MV E/A ratio:  1.17  Maude Emmer MD Electronically signed by Maude Emmer MD Signature Date/Time: 06/13/2021/12:39:10 PM    Final   TEE  ECHO TEE 04/23/2020  Narrative TRANSESOPHOGEAL ECHO REPORT    Patient Name:   Tracey [name] Date of Exam: 04/23/2020 Medical Rec #:  [id number]         Height:       65.0 in Accession #:    [id number]        Weight:       210.0 lb Date of Birth:  03-May-1955         BSA:          2.020 m Patient Age:    65 years          BP:           110/58 mmHg Patient Gender: F                 HR:           93 bpm. Exam Location:  Inpatient  Procedure: Transesophageal Echo, Color Doppler, Cardiac Doppler, 3D Echo and Saline Contrast Bubble Study  Indications:     Stroke I63.9  History:         Patient has prior history of Echocardiogram examinations, most recent 04/18/2020. Previous Myocardial Infarction and CAD.  Sonographer:     Augustin Seals RDCS (AE) Referring Phys:  [id number] KATE BIRCH MCDANIEL Diagnosing Phys: Darryle Decent  MD  PROCEDURE: After discussion of the risks and benefits of a TEE, an informed consent was obtained from the patient. TEE procedure time was 15 minutes. The transesophogeal probe was passed without difficulty through the esophogus of the patient. Imaged were obtained with the patient in a left lateral decubitus position. Local oropharyngeal anesthetic was provided with Cetacaine. Sedation performed by different physician. The patient was monitored while under deep sedation. Anesthestetic sedation was provided intravenously by Anesthesiology: 255mg  of Propofol , 60mg  of Lidocaine . Image quality was excellent. The patient's vital signs; including heart rate, blood pressure, and oxygen saturation; remained stable throughout the procedure. The patient developed no complications during the procedure.  IMPRESSIONS   1. 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 Bubble study is positive, consistent with a small to moderate size PFO. Agitated saline contrast bubble study was positive with shunting observed within 3-6 cardiac cycles suggestive of interatrial shunt. There is a moderately sized patent foramen ovale with predominantly right to left shunting across the atrial septum. 2. No left atrial/left atrial appendage thrombus was detected. The LAA emptying velocity was 58 cm/s. 3. Left ventricular ejection fraction, by estimation, is 50 to 55%. The left ventricle has low normal function. The left ventricle demonstrates regional wall motion abnormalities (see scoring diagram/findings for description). 4. Right ventricular systolic function is normal. The right ventricular size is normal. 5. The mitral valve is grossly normal. Trivial mitral valve regurgitation. No evidence of  mitral stenosis. 6. The aortic valve is tricuspid. Aortic valve regurgitation is not visualized. No aortic stenosis is present. 7. There is Moderate (Grade III) layered plaque involving the descending  aorta and transverse aorta.  FINDINGS Left Ventricle: Left ventricular ejection fraction, by estimation, is 50 to 55%. The left ventricle has low normal function. The left ventricle demonstrates regional wall motion abnormalities. The left ventricular internal cavity size was normal in size.   LV Wall Scoring: The apical septal segment and apex are akinetic.  Right Ventricle: The right ventricular size is normal. No increase in right ventricular wall thickness. Right ventricular systolic function is normal.  Left Atrium: Left atrial size was normal in size. No left atrial/left atrial appendage thrombus was detected. The LAA emptying velocity was 58 cm/s.  Right Atrium: Right atrial size was normal in size.  Pericardium: There is no evidence of pericardial effusion.  Mitral Valve: The mitral valve is grossly normal. Trivial mitral valve regurgitation. No evidence of mitral valve stenosis.  Tricuspid Valve: The tricuspid valve is grossly normal. Tricuspid valve regurgitation is mild . No evidence of tricuspid stenosis.  Aortic Valve: The aortic valve is tricuspid. Aortic valve regurgitation is not visualized. No aortic stenosis is present.  Pulmonic Valve: The pulmonic valve was grossly normal. Pulmonic valve regurgitation is not visualized. No evidence of pulmonic stenosis.  Aorta: The aortic root and ascending aorta are structurally normal, with no evidence of dilitation. There is moderate (Grade III) layered plaque involving the descending aorta and transverse aorta.  Venous: The right upper pulmonary vein, right lower pulmonary vein, left lower pulmonary vein and left upper pulmonary vein are normal.  IAS/Shunts: The interatrial septum is aneurysmal. The atrial septum is grossly normal. Agitated saline contrast was given intravenously to evaluate for intracardiac shunting. Agitated saline contrast bubble study was positive with shunting observed within 3-6 cardiac cycles suggestive  of interatrial shunt. A moderately sized patent foramen ovale is detected with predominantly right to left shunting across the atrial septum. 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 Bubble study is positive, consistent with a small to moderate size PFO.    AORTA Ao Root diam: 2.38 cm Ao Asc diam:  2.94 cm  TRICUSPID VALVE TR Peak grad:   14.6 mmHg TR Vmax:        191.00 cm/s  Darryle Decent MD Electronically signed by Darryle Decent MD Signature Date/Time: 04/23/2020/3:47:24 PM    Final      CARDIAC MRI  MR CARDIAC MORPHOLOGY W WO CONTRAST 10/09/2021  Narrative CLINICAL DATA:  CVA,?Apical thrombus  EXAM: CARDIAC MRI  TECHNIQUE: The patient was scanned on a 1.5 Tesla GE magnet. A dedicated cardiac coil was used. Functional imaging was done using Fiesta sequences. 2,3, and 4 chamber views were done to assess for RWMA's. Modified Simpson's rule using a short axis stack was used to calculate an ejection fraction on a dedicated work [employer]. The patient received 8 cc of Gadavist . After 10 minutes inversion recovery sequences were used to assess for infiltration and scar tissue.  CONTRAST:  Gadavist  8 cc  FINDINGS: Limited images of the lung fields showed no gross abnormalities. There is a loop recorder in the left chest wall.  Normal left ventricular size and wall thickness. Mid anteroseptal and mid anterior hypokinesis, apical septal akinesis with EF 48%. Normal right ventricular size and systolic function, EF 55%. Normal left and right atrial sizes. Trileaflet aortic valve, no stenosis or regurgitation. No  significant mitral regurgitation noted visually, calculated regurgitant fraction 16% suggests mild mitral regurgitation.  Delayed enhancement imaging: This showed >50% wall thickness subendocardial late gadolinium enhancement (LGE) in the mid anteroseptal, mid anterior, and apical septal walls. No LV  thrombus detected.  MEASUREMENTS: MEASUREMENTS LVEDV 122 mL  LVEDVi 57 mL/m2  LVSV 59 mL  LVEF 48%  RVEDV 85 mL RVEDVi 39 mL/m2  RVSV 46 mL  RVEF 55%  Aortic foward volume 49 mL  IMPRESSION: 1. Normal LV size with wall motion abnormalities noted above. LV EF 48%.  2.  No LV thrombus detected.  3.  Normal RV size and systolic function, EF 55%.  4. Delayed enhancement pattern suggests prior MI in the LAD territory.  Dalton Mclean   Electronically Signed By: Ezra Shuck M.D. On: 10/10/2021 09:37   ______________________________________________________________________________________________      Physical Exam:    VS:  BP 100/67 (BP Location: Left Arm)   Pulse 62   Ht 5' 4 (1.626 m)   Wt 206 lb (93.4 kg)   SpO2 96%   BMI 35.36 kg/m    Wt Readings from Last 3 Encounters:  02/03/24 206 lb (93.4 kg)  11/18/23 212 lb (96.2 kg)  07/02/23 217 lb (98.4 kg)    Gen: no distress, morbid obesity   Neck: No JVD Cardiac: No Rubs or Gallops, no Murmur, RRR +2 radial pulses Respiratory: Clear to auscultation bilaterally, normal effort, normal  respiratory rate GI: Soft, nontender, non-distended  MS: No  edema;  moves all extremities Integument: Skin feels warm Neuro:  At time of evaluation, alert and oriented to person/place/time/situation  Psych: Normal affect, patient feels ok     ASSESSMENT AND PLAN: .    Coronary artery disease, status post anterior MI and PCI DOE Hx of Cardiac Arrest (MI) Coronary artery disease with anterior MI and PCI in 2010. Heart function has improved to low normal. No recent evidence of aneurysm or thrombus. Last ischemic workup was in 2016. Stress tests have been inconclusive due to false positives. - Ordered myocardial perfusion imaging to assess for significant blockages and coronary flow- last ischemic work up was from 2016 at [facility]; she has told me that every time she gets a stress test with SPECT it is always a false  positive - If no significant ischemia, will initiate cardiac rehab or supervised exercise therapy.  Heart failure with mildly reduced ejection fraction Heart failure with mildly reduced ejection fraction. Heart function has improved to low normal. - Encouraged participation in cardiac rehab or supervised exercise therapy to improve heart function.  Cryptogenic stroke in the setting of aortic atherosclerosis Cryptogenic stroke in 2022 with aortic atherosclerosis. No PFO closure performed after discussion with Dr. Alveta. Implantable loop recorder was stopped due to insurance issues. No atrial fibrillation or flutter detected. - Continue monitoring for atrial fibrillation or flutter.  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  No atrial fibrillation or flutter detected on implantable loop recorder.  Peripheral arterial disease with claudication Peripheral arterial disease with claudication, causing leg pain and difficulty walking. Deconditioning may contribute to symptoms. - Initiated supervised exercise therapy to improve claudication symptoms.  Resting tachycardia and palpitations Episodes of resting tachycardia with heart rate reaching 124 bpm. No atrial fibrillation or flutter detected. Possible anxiety-related palpitations. Previous implantable loop recorder did not show arrhythmias. - If palpitations persist, will consider repeating heart monitor.  Hyperlipidemia Managed with Zetia . Last LDL was 84 mg/dL in 7976. No  recent labs available. - Continue current lipid-lowering therapy, repeat labs pending in the spring  F/u with me or my team in three months  Stanly Leavens, MD FASE [facility] Cardiologist [facility]  [address] [place], [address] [place], [place] [address] [phone]  10:22 AM  "

## 2024-02-03 NOTE — Patient Instructions (Signed)
 Medication Instructions:  Your physician recommends that you continue on your current medications as directed. Please refer to the Current Medication list given to you today.  *If you need a refill on your cardiac medications before your next appointment, please call your pharmacy*  Lab Work: NONE    Testing/Procedures: Your physician has requested that you have a Cardiac PET Scan.   Follow-Up: At [facility], you and your health needs are our priority.  As part of our continuing mission to provide you with exceptional heart care, our providers are all part of one team.  This team includes your primary Cardiologist (physician) and Advanced Practice Providers or APPs (Physician Assistants and Nurse Practitioners) who all work together to provide you with the care you need, when you need it.  Your next appointment:   3 month(s)  Provider:   Stanly Leavens, MD or Orren Fabry, PA-C, Lum Louis, NP, Aline Door, PA-C, or Damien Braver, NP        Other Instructions    Please report to Radiology at the [facility] Main Entrance 30 minutes early for your test.  [address] [place], [place] [address]                         OR   Please report to Radiology at [facility] Main Entrance, medical mall, 30 mins prior to your test.  [address]  [place], [place]  How to Prepare for Your Cardiac PET/CT Stress Test:  Nothing to eat or drink, except water, 3 hours prior to arrival time.  NO caffeine /decaffeinated products, or chocolate 12 hours prior to arrival. (Please note decaffeinated beverages (teas/coffees) still contain caffeine ).  If you have caffeine  within 12 hours prior, the test will need to be rescheduled.  Medication instructions: Do not take erectile dysfunction medications for 72 hours prior to test (sildenafil, tadalafil) Do not take nitrates (isosorbide mononitrate, Ranexa) the day before or day of test Do  not take tamsulosin the day before or morning of test Hold theophylline containing medications for 12 hours. Hold Dipyridamole 48 hours prior to the test.  Diabetic Preparation: If able to eat breakfast prior to 3 hour fasting, you may take all medications, including your insulin. Do not worry if you miss your breakfast dose of insulin - start at your next meal. If you do not eat prior to 3 hour fast-Hold all diabetes (oral and insulin) medications. Patients who wear a continuous glucose monitor MUST remove the device prior to scanning.  You may take your remaining medications with water.  NO perfume, cologne or lotion on chest or abdomen area. FEMALES - Please avoid wearing dresses to this appointment.  Total time is 1 to 2 hours; you may want to bring reading material for the waiting time.   In preparation for your appointment, medication and supplies will be purchased.  Appointment availability is limited, so if you need to cancel or reschedule, please call the Radiology Department Scheduler at [phone] 24 hours in advance to avoid a cancellation fee of $100.00  What to Expect When you Arrive:  Once you arrive and check in for your appointment, you will be taken to a preparation room within the Radiology Department.  A technologist or Nurse will obtain your medical history, verify that you are correctly prepped for the exam, and explain the procedure.  Afterwards, an IV will be started in your arm and electrodes will be  placed on your skin for EKG monitoring during the stress portion of the exam. Then you will be escorted to the PET/CT scanner.  There, staff will get you positioned on the scanner and obtain a blood pressure and EKG.  During the exam, you will continue to be connected to the EKG and blood pressure machines.  A small, safe amount of a radioactive tracer will be injected in your IV to obtain a series of pictures of your heart along with an injection of a stress agent.     After your Exam:  It is recommended that you eat a meal and drink a caffeinated beverage to counter act any effects of the stress agent.  Drink plenty of fluids for the remainder of the day and urinate frequently for the first couple of hours after the exam.  Your doctor will inform you of your test results within 7-10 business days.  For more information and frequently asked questions, please visit our website: [email or web address]  For questions about your test or how to prepare for your test, please call: Cardiac Imaging Nurse Navigators Office: [phone]

## 2024-02-04 ENCOUNTER — Encounter

## 2024-02-05 ENCOUNTER — Encounter

## 2024-02-26 ENCOUNTER — Encounter (HOSPITAL_COMMUNITY): Payer: Self-pay

## 2024-02-27 ENCOUNTER — Encounter: Payer: Self-pay | Admitting: Internal Medicine

## 2024-02-27 DIAGNOSIS — R531 Weakness: Secondary | ICD-10-CM

## 2024-02-29 ENCOUNTER — Telehealth (HOSPITAL_COMMUNITY): Payer: Self-pay | Admitting: Emergency Medicine

## 2024-02-29 NOTE — Telephone Encounter (Signed)
 Reaching out to patient to offer assistance regarding upcoming cardiac imaging study; pt verbalizes understanding of appt date/time, parking situation and where to check in, pre-test NPO status and medications ordered, and verified current allergies; name and call back number provided for further questions should they arise Rockwell Alexandria RN Navigator Cardiac Imaging [facility] [phone] office [phone] cell

## 2024-03-01 ENCOUNTER — Encounter (HOSPITAL_COMMUNITY): Payer: Self-pay

## 2024-03-01 ENCOUNTER — Ambulatory Visit (HOSPITAL_COMMUNITY)

## 2024-03-01 ENCOUNTER — Ambulatory Visit (HOSPITAL_COMMUNITY): Admission: RE | Admit: 2024-03-01 | Source: Ambulatory Visit

## 2024-03-01 DIAGNOSIS — R079 Chest pain, unspecified: Secondary | ICD-10-CM

## 2024-03-01 DIAGNOSIS — I25118 Atherosclerotic heart disease of native coronary artery with other forms of angina pectoris: Secondary | ICD-10-CM

## 2024-03-01 LAB — NM PET CT CARDIAC PERFUSION MULTI W/ABSOLUTE BLOODFLOW
LV dias vol: 74 mL (ref 46–106)
MBFR: 2.26
Nuc Rest EF: 51 %
Nuc Stress EF: 58 %
Peak HR: 113 {beats}/min
Rest HR: 71 {beats}/min
Rest MBF: 0.74 ml/g/min
Rest Nuclear Isotope Dose: 24 mCi
Stress MBF: 1.67 ml/g/min
Stress Nuclear Isotope Dose: 23.9 mCi
TID: 1.09

## 2024-03-01 MED ORDER — RUBIDIUM RB82 GENERATOR (RUBYFILL)
23.8800 | PACK | Freq: Once | INTRAVENOUS | Status: AC
  Administered 2024-03-01: 23.88 via INTRAVENOUS

## 2024-03-01 MED ORDER — REGADENOSON 0.4 MG/5ML IV SOLN
INTRAVENOUS | Status: AC
  Filled 2024-03-01: qty 5

## 2024-03-01 MED ORDER — REGADENOSON 0.4 MG/5ML IV SOLN
0.4000 mg | Freq: Once | INTRAVENOUS | Status: AC
  Administered 2024-03-01: 0.4 mg via INTRAVENOUS

## 2024-03-01 MED ORDER — RUBIDIUM RB82 GENERATOR (RUBYFILL)
24.0300 | PACK | Freq: Once | INTRAVENOUS | Status: AC
  Administered 2024-03-01: 24.03 via INTRAVENOUS

## 2024-03-02 ENCOUNTER — Ambulatory Visit: Payer: Self-pay | Admitting: Internal Medicine

## 2024-03-04 LAB — BASIC METABOLIC PANEL WITH GFR
BUN/Creatinine Ratio: 15 (ref 12–28)
BUN: 14 mg/dL (ref 8–27)
CO2: 23 mmol/L (ref 20–29)
Calcium: 9.6 mg/dL (ref 8.7–10.3)
Chloride: 103 mmol/L (ref 96–106)
Creatinine, Ser: 0.93 mg/dL (ref 0.57–1.00)
Glucose: 78 mg/dL (ref 70–99)
Potassium: 4.7 mmol/L (ref 3.5–5.2)
Sodium: 144 mmol/L (ref 134–144)
eGFR: 68 mL/min/{1.73_m2}

## 2024-03-07 ENCOUNTER — Encounter

## 2024-04-07 ENCOUNTER — Encounter

## 2024-05-03 ENCOUNTER — Ambulatory Visit: Admitting: Emergency Medicine

## 2024-05-08 ENCOUNTER — Encounter
